# Patient Record
Sex: Female | Born: 1935 | Race: White | Hispanic: No | Smoking: Former smoker
Health system: Southern US, Community
[De-identification: ages and names within clinical notes are randomized; demographics above are authoritative.]

## PROBLEM LIST (undated history)

## (undated) DIAGNOSIS — E785 Hyperlipidemia, unspecified: Secondary | ICD-10-CM

## (undated) DIAGNOSIS — F411 Generalized anxiety disorder: Secondary | ICD-10-CM

## (undated) DIAGNOSIS — F3289 Other specified depressive episodes: Secondary | ICD-10-CM

## (undated) DIAGNOSIS — F329 Major depressive disorder, single episode, unspecified: Secondary | ICD-10-CM

## (undated) DIAGNOSIS — J45909 Unspecified asthma, uncomplicated: Secondary | ICD-10-CM

## (undated) DIAGNOSIS — Z86010 Personal history of colonic polyps: Secondary | ICD-10-CM

## (undated) DIAGNOSIS — I10 Essential (primary) hypertension: Secondary | ICD-10-CM

## (undated) DIAGNOSIS — N200 Calculus of kidney: Secondary | ICD-10-CM

## (undated) DIAGNOSIS — M8580 Other specified disorders of bone density and structure, unspecified site: Secondary | ICD-10-CM

## (undated) DIAGNOSIS — F1910 Other psychoactive substance abuse, uncomplicated: Secondary | ICD-10-CM

## (undated) HISTORY — PX: ANKLE SURGERY: SHX546

## (undated) HISTORY — PX: BREAST SURGERY: SHX581

## (undated) HISTORY — DX: Generalized anxiety disorder: F41.1

## (undated) HISTORY — DX: Personal history of colonic polyps: Z86.010

## (undated) HISTORY — DX: Major depressive disorder, single episode, unspecified: F32.9

## (undated) HISTORY — DX: Other specified depressive episodes: F32.89

## (undated) HISTORY — PX: TONSILLECTOMY: SHX5217

## (undated) HISTORY — DX: Unspecified asthma, uncomplicated: J45.909

## (undated) HISTORY — DX: Other specified disorders of bone density and structure, unspecified site: M85.80

## (undated) HISTORY — DX: Other psychoactive substance abuse, uncomplicated: F19.10

## (undated) HISTORY — DX: Calculus of kidney: N20.0

## (undated) HISTORY — DX: Essential (primary) hypertension: I10

## (undated) HISTORY — DX: Hyperlipidemia, unspecified: E78.5

## (undated) HISTORY — PX: DILATION AND CURETTAGE OF UTERUS: SHX78

---

## 1999-10-28 ENCOUNTER — Other Ambulatory Visit: Admission: RE | Admit: 1999-10-28 | Discharge: 1999-10-28 | Payer: Self-pay | Admitting: Obstetrics & Gynecology

## 2000-04-12 ENCOUNTER — Encounter: Payer: Self-pay | Admitting: Obstetrics & Gynecology

## 2000-04-12 ENCOUNTER — Ambulatory Visit (HOSPITAL_COMMUNITY): Admission: RE | Admit: 2000-04-12 | Discharge: 2000-04-12 | Payer: Self-pay | Admitting: Obstetrics & Gynecology

## 2000-06-21 ENCOUNTER — Encounter: Payer: Self-pay | Admitting: Obstetrics & Gynecology

## 2000-06-23 ENCOUNTER — Encounter (INDEPENDENT_AMBULATORY_CARE_PROVIDER_SITE_OTHER): Payer: Self-pay

## 2000-06-23 ENCOUNTER — Ambulatory Visit (HOSPITAL_COMMUNITY): Admission: RE | Admit: 2000-06-23 | Discharge: 2000-06-23 | Payer: Self-pay | Admitting: Obstetrics & Gynecology

## 2000-10-30 ENCOUNTER — Other Ambulatory Visit: Admission: RE | Admit: 2000-10-30 | Discharge: 2000-10-30 | Payer: Self-pay | Admitting: Obstetrics & Gynecology

## 2001-08-16 ENCOUNTER — Other Ambulatory Visit: Admission: RE | Admit: 2001-08-16 | Discharge: 2001-08-16 | Payer: Self-pay | Admitting: Obstetrics & Gynecology

## 2002-06-11 DIAGNOSIS — Z860100 Personal history of colon polyps, unspecified: Secondary | ICD-10-CM

## 2002-06-11 HISTORY — DX: Personal history of colon polyps, unspecified: Z86.0100

## 2002-06-11 HISTORY — DX: Personal history of colonic polyps: Z86.010

## 2002-07-05 ENCOUNTER — Encounter (INDEPENDENT_AMBULATORY_CARE_PROVIDER_SITE_OTHER): Payer: Self-pay | Admitting: Gastroenterology

## 2002-08-26 ENCOUNTER — Other Ambulatory Visit: Admission: RE | Admit: 2002-08-26 | Discharge: 2002-08-26 | Payer: Self-pay | Admitting: Obstetrics & Gynecology

## 2003-04-02 ENCOUNTER — Encounter (HOSPITAL_BASED_OUTPATIENT_CLINIC_OR_DEPARTMENT_OTHER): Payer: Self-pay | Admitting: Internal Medicine

## 2003-04-02 ENCOUNTER — Encounter: Admission: RE | Admit: 2003-04-02 | Discharge: 2003-04-02 | Payer: Self-pay | Admitting: Internal Medicine

## 2005-01-12 ENCOUNTER — Other Ambulatory Visit: Admission: RE | Admit: 2005-01-12 | Discharge: 2005-01-12 | Payer: Self-pay | Admitting: Obstetrics & Gynecology

## 2005-07-19 ENCOUNTER — Ambulatory Visit: Payer: Self-pay | Admitting: Gastroenterology

## 2005-08-02 ENCOUNTER — Ambulatory Visit: Payer: Self-pay | Admitting: Gastroenterology

## 2007-01-23 ENCOUNTER — Encounter: Payer: Self-pay | Admitting: Internal Medicine

## 2009-03-24 ENCOUNTER — Ambulatory Visit: Payer: Self-pay | Admitting: Gastroenterology

## 2009-03-24 DIAGNOSIS — Z860100 Personal history of colon polyps, unspecified: Secondary | ICD-10-CM | POA: Insufficient documentation

## 2009-03-24 LAB — CONVERTED CEMR LAB
ALT: 25 units/L (ref 0–35)
AST: 31 units/L (ref 0–37)
Albumin: 4.2 g/dL (ref 3.5–5.2)
Alkaline Phosphatase: 55 units/L (ref 39–117)
BUN: 26 mg/dL — ABNORMAL HIGH (ref 6–23)
Basophils Absolute: 0.2 10*3/uL — ABNORMAL HIGH (ref 0.0–0.1)
Basophils Relative: 2.4 % (ref 0.0–3.0)
CO2: 30 meq/L (ref 19–32)
Calcium: 10.1 mg/dL (ref 8.4–10.5)
Chloride: 104 meq/L (ref 96–112)
Creatinine, Ser: 1.1 mg/dL (ref 0.4–1.2)
Eosinophils Absolute: 0.1 10*3/uL (ref 0.0–0.7)
Eosinophils Relative: 1.9 % (ref 0.0–5.0)
GFR calc non Af Amer: 50.97 mL/min (ref >60)
Glucose, Bld: 97 mg/dL (ref 70–99)
HCT: 40.4 % (ref 36.0–46.0)
Hemoglobin: 14 g/dL (ref 12.0–15.0)
Lymphocytes Relative: 25.9 % (ref 12.0–46.0)
Lymphs Abs: 1.8 10*3/uL (ref 0.7–4.0)
MCHC: 34.6 g/dL (ref 30.0–36.0)
MCV: 93.1 fL (ref 78.0–100.0)
Monocytes Absolute: 0.6 10*3/uL (ref 0.1–1.0)
Monocytes Relative: 9.1 % (ref 3.0–12.0)
Neutro Abs: 4.3 10*3/uL (ref 1.4–7.7)
Neutrophils Relative %: 60.7 % (ref 43.0–77.0)
Platelets: 242 10*3/uL (ref 150.0–400.0)
Potassium: 4.4 meq/L (ref 3.5–5.1)
RBC: 4.34 M/uL (ref 3.87–5.11)
RDW: 12.9 % (ref 11.5–14.6)
Sodium: 141 meq/L (ref 135–145)
TSH: 2.18 microintl units/mL (ref 0.35–5.50)
Total Bilirubin: 1.1 mg/dL (ref 0.3–1.2)
Total Protein: 7.3 g/dL (ref 6.0–8.3)
WBC: 7 10*3/uL (ref 4.5–10.5)

## 2009-03-25 ENCOUNTER — Ambulatory Visit: Payer: Self-pay | Admitting: Gastroenterology

## 2009-03-25 ENCOUNTER — Encounter: Payer: Self-pay | Admitting: Gastroenterology

## 2009-03-26 ENCOUNTER — Encounter: Payer: Self-pay | Admitting: Gastroenterology

## 2009-03-26 LAB — CONVERTED CEMR LAB: Tissue Transglutaminase Ab, IgA: 0 units (ref <7)

## 2009-03-27 ENCOUNTER — Telehealth: Payer: Self-pay | Admitting: Gastroenterology

## 2009-04-08 ENCOUNTER — Ambulatory Visit: Payer: Self-pay | Admitting: Gastroenterology

## 2009-04-08 DIAGNOSIS — M359 Systemic involvement of connective tissue, unspecified: Secondary | ICD-10-CM | POA: Insufficient documentation

## 2009-04-27 ENCOUNTER — Telehealth: Payer: Self-pay | Admitting: Gastroenterology

## 2009-06-08 ENCOUNTER — Ambulatory Visit: Payer: Self-pay | Admitting: Gastroenterology

## 2009-06-30 ENCOUNTER — Ambulatory Visit: Payer: Self-pay | Admitting: Internal Medicine

## 2009-06-30 DIAGNOSIS — J309 Allergic rhinitis, unspecified: Secondary | ICD-10-CM | POA: Insufficient documentation

## 2009-06-30 DIAGNOSIS — R5381 Other malaise: Secondary | ICD-10-CM

## 2009-06-30 DIAGNOSIS — J45909 Unspecified asthma, uncomplicated: Secondary | ICD-10-CM | POA: Insufficient documentation

## 2009-06-30 DIAGNOSIS — I10 Essential (primary) hypertension: Secondary | ICD-10-CM

## 2009-06-30 DIAGNOSIS — Z87448 Personal history of other diseases of urinary system: Secondary | ICD-10-CM | POA: Insufficient documentation

## 2009-06-30 DIAGNOSIS — F32A Depression, unspecified: Secondary | ICD-10-CM | POA: Insufficient documentation

## 2009-06-30 DIAGNOSIS — R5383 Other fatigue: Secondary | ICD-10-CM

## 2009-06-30 DIAGNOSIS — E785 Hyperlipidemia, unspecified: Secondary | ICD-10-CM

## 2009-06-30 DIAGNOSIS — F329 Major depressive disorder, single episode, unspecified: Secondary | ICD-10-CM

## 2009-06-30 LAB — CONVERTED CEMR LAB: Pap Smear: NORMAL

## 2009-07-01 ENCOUNTER — Encounter (INDEPENDENT_AMBULATORY_CARE_PROVIDER_SITE_OTHER): Payer: Self-pay | Admitting: *Deleted

## 2009-07-01 LAB — CONVERTED CEMR LAB
AST: 28 units/L (ref 0–37)
Albumin: 4.2 g/dL (ref 3.5–5.2)
Alkaline Phosphatase: 51 units/L (ref 39–117)
BUN: 19 mg/dL (ref 6–23)
Basophils Absolute: 0 10*3/uL (ref 0.0–0.1)
Calcium: 9.9 mg/dL (ref 8.4–10.5)
Creatinine, Ser: 1 mg/dL (ref 0.4–1.2)
Eosinophils Relative: 2.5 % (ref 0.0–5.0)
GFR calc non Af Amer: 56.86 mL/min (ref >60)
Glucose, Bld: 95 mg/dL (ref 70–99)
Lymphocytes Relative: 25.1 % (ref 12.0–46.0)
Monocytes Relative: 10.6 % (ref 3.0–12.0)
Neutrophils Relative %: 61.1 % (ref 43.0–77.0)
Platelets: 228 10*3/uL (ref 150.0–400.0)
Potassium: 4.1 meq/L (ref 3.5–5.1)
RDW: 13.1 % (ref 11.5–14.6)
TSH: 1.63 microintl units/mL (ref 0.35–5.50)
Total Bilirubin: 1.2 mg/dL (ref 0.3–1.2)
WBC: 6.8 10*3/uL (ref 4.5–10.5)

## 2009-08-11 ENCOUNTER — Ambulatory Visit: Payer: Self-pay | Admitting: Internal Medicine

## 2009-08-18 ENCOUNTER — Emergency Department (HOSPITAL_COMMUNITY): Admission: EM | Admit: 2009-08-18 | Discharge: 2009-08-18 | Payer: Self-pay | Admitting: Emergency Medicine

## 2009-08-18 ENCOUNTER — Encounter: Payer: Self-pay | Admitting: Gastroenterology

## 2009-08-18 DIAGNOSIS — N200 Calculus of kidney: Secondary | ICD-10-CM

## 2009-08-18 LAB — CONVERTED CEMR LAB
ALT: 28 units/L
AST: 34 units/L
Alkaline Phosphatase: 54 units/L
BUN: 18 mg/dL
Basophils Relative: 0 %
Bilirubin Urine: NEGATIVE
Calcium: 10.9 mg/dL
Glucose, Bld: 138 mg/dL
Glucose, Urine, Semiquant: NEGATIVE
HCT: 40.2 %
Hemoglobin: 13.9 g/dL
Ketones, urine, test strip: NEGATIVE
Lymphocytes, automated: 11 %
MCV: 93.5 fL
Monocytes Relative: 7 %
Platelets: 229 10*3/uL
Protein, U semiquant: NEGATIVE
Total Bilirubin: 1.1 mg/dL
Total Protein: 7 g/dL
Urobilinogen, UA: 0.2
WBC: 10.5 10*3/uL

## 2009-08-20 ENCOUNTER — Ambulatory Visit: Payer: Self-pay | Admitting: Internal Medicine

## 2009-08-20 LAB — CONVERTED CEMR LAB
Glucose, Urine, Semiquant: NEGATIVE
Ketones, urine, test strip: NEGATIVE
Nitrite: NEGATIVE
Urobilinogen, UA: 0.2
pH: 5

## 2009-08-24 ENCOUNTER — Encounter: Payer: Self-pay | Admitting: Internal Medicine

## 2009-08-25 ENCOUNTER — Telehealth: Payer: Self-pay | Admitting: Gastroenterology

## 2009-09-07 ENCOUNTER — Encounter: Payer: Self-pay | Admitting: Internal Medicine

## 2009-09-09 ENCOUNTER — Ambulatory Visit: Payer: Self-pay | Admitting: Internal Medicine

## 2009-09-24 ENCOUNTER — Ambulatory Visit: Payer: Self-pay | Admitting: Internal Medicine

## 2009-10-06 ENCOUNTER — Encounter: Payer: Self-pay | Admitting: Internal Medicine

## 2009-10-06 ENCOUNTER — Ambulatory Visit: Payer: Self-pay | Admitting: Psychiatry

## 2009-10-14 ENCOUNTER — Ambulatory Visit: Payer: Self-pay | Admitting: Internal Medicine

## 2009-10-14 DIAGNOSIS — F411 Generalized anxiety disorder: Secondary | ICD-10-CM

## 2009-10-15 ENCOUNTER — Ambulatory Visit: Payer: Self-pay | Admitting: Psychiatry

## 2009-12-07 ENCOUNTER — Telehealth: Payer: Self-pay | Admitting: Internal Medicine

## 2009-12-10 ENCOUNTER — Encounter: Payer: Self-pay | Admitting: Internal Medicine

## 2009-12-24 ENCOUNTER — Ambulatory Visit: Payer: Self-pay | Admitting: Internal Medicine

## 2010-01-07 ENCOUNTER — Encounter: Payer: Self-pay | Admitting: Internal Medicine

## 2010-02-10 ENCOUNTER — Ambulatory Visit: Payer: Self-pay | Admitting: Internal Medicine

## 2010-02-10 DIAGNOSIS — N959 Unspecified menopausal and perimenopausal disorder: Secondary | ICD-10-CM | POA: Insufficient documentation

## 2010-02-10 DIAGNOSIS — M25579 Pain in unspecified ankle and joints of unspecified foot: Secondary | ICD-10-CM

## 2010-02-10 LAB — CONVERTED CEMR LAB
Cholesterol: 175 mg/dL (ref 0–200)
LDL Cholesterol: 74 mg/dL (ref 0–99)
TSH: 1.62 microintl units/mL (ref 0.35–5.50)
Total CHOL/HDL Ratio: 2
VLDL: 18.8 mg/dL (ref 0.0–40.0)
Vitamin B-12: 905 pg/mL (ref 211–911)

## 2010-02-12 ENCOUNTER — Encounter: Payer: Self-pay | Admitting: Internal Medicine

## 2010-02-12 ENCOUNTER — Telehealth (INDEPENDENT_AMBULATORY_CARE_PROVIDER_SITE_OTHER): Payer: Self-pay | Admitting: *Deleted

## 2010-02-12 ENCOUNTER — Ambulatory Visit: Payer: Self-pay | Admitting: Internal Medicine

## 2010-03-10 ENCOUNTER — Ambulatory Visit: Payer: Self-pay | Admitting: Internal Medicine

## 2010-03-18 ENCOUNTER — Telehealth: Payer: Self-pay | Admitting: Internal Medicine

## 2010-04-19 ENCOUNTER — Ambulatory Visit: Payer: Self-pay | Admitting: Internal Medicine

## 2010-05-26 ENCOUNTER — Ambulatory Visit: Payer: Self-pay | Admitting: Internal Medicine

## 2010-06-25 ENCOUNTER — Ambulatory Visit: Payer: Self-pay | Admitting: Internal Medicine

## 2010-09-09 ENCOUNTER — Ambulatory Visit: Payer: Self-pay | Admitting: Internal Medicine

## 2010-09-29 ENCOUNTER — Ambulatory Visit: Payer: Self-pay | Admitting: Internal Medicine

## 2011-01-11 NOTE — Progress Notes (Signed)
Summary: Effexor?/VAL?  Phone Note Call from Patient Call back at Home Phone [phone]   Caller: Patient Action Taken: Provider Notified Summary of Call: pt called to inform MD that she stopped Effexor 3 days before Cataract surgery 03/17/2010. Pt re-started Effexor the day of the surgery and noticied that she was "hyper". Pt is concerned about this and is unsure if this is a reaction to the Effexor or the eye drops she is taking (Prednisone 4 x a day). please advise Initial call taken by: Margaret Pyle, CMA,  March 18, 2010 10:18 AM  Follow-up for Phone Call        woukd expect the symptoms to be related to pred rather than effexor. rec pt take effexor as rx'd as this is low dose. Follow-up by: Newt Lukes MD,  March 18, 2010 11:04 AM  Additional Follow-up for Phone Call Additional follow up Details #1::        pt informed Additional Follow-up by: Margaret Pyle, CMA,  March 18, 2010 11:23 AM

## 2011-01-11 NOTE — Miscellaneous (Signed)
Summary: BONE DENSITY  Clinical Lists Changes  Orders: Added new Test order of T-Bone Densitometry (77080) - Signed Added new Test order of T-Lumbar Vertebral Assessment (77082) - Signed 

## 2011-01-11 NOTE — Assessment & Plan Note (Signed)
Summary: 4-6 wks /# / cd   Vital Signs:  Patient profile:   77 year old female Height:      65 inches (165.10 cm) Weight:      134.0 pounds (60.91 kg) O2 Sat:      99 % on Room air Temp:     98.2 degrees F (36.78 degrees C) oral Pulse rate:   74 / minute BP sitting:   130 / 72  (left arm) Cuff size:   regular  Vitals Entered By: Orlan Leavens (Apr 19, 2010 9:04 AM)  O2 Flow:  Room air CC: 4 week follow-up Is Patient Diabetic? No Pain Assessment Patient in pain? no        Primary Care Provider:  Newt Lukes MD  CC:  4 week follow-up.  History of Present Illness: 4 week f/u   1) depression - started on effexor (generic) low dose 3/30 - tol without adv effects note intol of zoloft prev has not been to counseling in > 4 months - still freq tearful spells - feels "so sad" but no SI  2) HTN - stopped amlodipine indep because feels blood pressure has been better controlled -  home log reviewed - SBP 110s-140s, DBP 60-70s no signs or symptoms of problem such as HA, CP or SOB -   3) continued left ankle swelling and "bruise" discoloration - ?if baby asa is making this worse - would like to stop asa for few weeks to see if makes any change   Clinical Review Panels:  CBC   WBC:  10.5 (08/18/2009)   RBC:  4.30 (08/18/2009)   Hgb:  13.9 (08/18/2009)   Hct:  40.2 (08/18/2009)   Platelets:  229 (08/18/2009)   MCV  93.5 (08/18/2009)   MCHC  34.3 (06/30/2009)   RDW  13.6 (08/18/2009)   PMN:  81 (08/18/2009)   Lymphs:  25.1 (06/30/2009)   Monos:  7 (08/18/2009)   Eosinophils:  1 (08/18/2009)   Basophil:  0 (08/18/2009)  Complete Metabolic Panel   Glucose:  138 (08/18/2009)   Sodium:  143 (08/18/2009)   Potassium:  3.6 (08/18/2009)   Chloride:  104 (08/18/2009)   CO2:  31 (08/18/2009)   BUN:  18 (08/18/2009)   Creatinine:  1.25 (08/18/2009)   Albumin:  4.1 (08/18/2009)   Total Protein:  7.0 (08/18/2009)   Calcium:  10.9 (08/18/2009)   Total Bili:  1.1  (08/18/2009)   Alk Phos:  54 (08/18/2009)   SGPT (ALT):  28 (08/18/2009)   SGOT (AST):  34 (08/18/2009)   Current Medications (verified): 1)  Lisinopril 10 Mg Tabs (Lisinopril) .Marland Kitchen.. 1 Tablet By Mouth Once Daily 2)  Triamterene-Hctz 37.5-25 Mg Tabs (Triamterene-Hctz) .Marland Kitchen.. 1 By Mouth Once Daily 3)  Multivitamins   Tabs (Multiple Vitamin) .Marland Kitchen.. 1 Tablet By Mouth Once Daily 4)  Aspirin 81 Mg Tbec (Aspirin) .Marland Kitchen.. 1 Tablet By Mouth Once Daily 5)  Citracal/vitamin D 250-200 Mg-Unit Tabs (Calcium Citrate-Vitamin D) .... 2 By Mouth Two Times A Day 6)  Lipitor 10 Mg Tabs (Atorvastatin Calcium) .Marland Kitchen.. 1 Tablet By Mouth Once Daily 7)  Align  Caps (Misc Intestinal Flora Regulat) .... Take 1 Tablet By Mouth Once A Day 8)  Venlafaxine Hcl 37.5 Mg Xr24h-Tab (Venlafaxine Hcl) .Marland Kitchen.. 1 By Mouth Once Daily  Allergies (verified): 1)  ! * Lialda 2)  ! * Diphen/atrophine  Past History:  Past Medical History: Diverticulosis Hemorrhoids Kidney Stones Adenomatous Colon Polyps 06/2002 Anxiety Disorder Collagenous colitis 2010 Allergic  rhinitis Asthma Colonic polyps, hx of Depression Hyperlipidemia Hypertension  MD rooster: gyn - wein GI - mann  Review of Systems       The patient complains of depression.  The patient denies anorexia, headaches, and difficulty walking.    Physical Exam  General:  alert, well-developed, well-nourished, and cooperative to examination.    Lungs:  normal respiratory effort, no intercostal retractions or use of accessory muscles; normal breath sounds bilaterally - no crackles and no wheezes.    Heart:  normal rate, regular rhythm, no murmur, and no rub. BLE without edema but chronic left ankle and foot enlargement as compared to right - good cap refill B feet Msk:   L ankle with chronic bony enlargment as compared to right side - FROM without pain passive or active - nontender to palp - neurovasc intact - no other joint effusions or deformities in LE - hands with OA  changes in left thumb at MCP and right 5th digit with ulnar deviation   Impression & Recommendations:  Problem # 1:  DEPRESSION (ICD-311)  Her updated medication list for this problem includes:    Venlafaxine Hcl 75 Mg Xr24h-tab (Venlafaxine hcl) .Marland Kitchen... 1 by mouth once daily  recents labs reviewed - normal TSH, B12 - long hx recurrent symptoms - intol of zolft d/t ?diarrhea SE - will continue titrating generic effexor -  d/w pt re: risk/ benefit tx with this med - f/u symptoms and med 4-6 weeks, sooner if probs if still not improved taper off and try new med  Orders: Prescription Created Electronically ([id number])  Problem # 2:  PAIN IN JOINT, ANKLE AND FOOT (ICD-719.47) prior xray did not show DJD or injury early 02/2010 - ok to stop asa for few weeks to see if this makes any change - consider vasc eval though good cap refill present  Problem # 3:  HYPERTENSION (ICD-401.9)  Her updated medication list for this problem includes:    Lisinopril 10 Mg Tabs (Lisinopril) .Marland Kitchen... 1 tablet by mouth once daily    Triamterene-hctz 37.5-25 Mg Tabs (Triamterene-hctz) .Marland Kitchen... 1 by mouth once daily  BP today: 130/72 Prior BP: 124/64 (03/10/2010)  Labs Reviewed: K+: 3.6 (08/18/2009) Creat: : 1.25 (08/18/2009)   Chol: 175 (02/10/2010)   HDL: 82.10 (02/10/2010)   LDL: 74 (02/10/2010)   TG: 94.0 (02/10/2010)  Problem # 4:  HYPERLIPIDEMIA (ICD-272.4)  Her updated medication list for this problem includes:    Lipitor 10 Mg Tabs (Atorvastatin calcium) .Marland Kitchen... 1 tablet by mouth once daily  Labs Reviewed: SGOT: 34 (08/18/2009)   SGPT: 28 (08/18/2009)   HDL:82.10 (02/10/2010)  LDL:74 (02/10/2010)  Chol:175 (02/10/2010), 163 (06/30/2009)  Trig:94.0 (02/10/2010)  Complete Medication List: 1)  Lisinopril 10 Mg Tabs (Lisinopril) .Marland Kitchen.. 1 tablet by mouth once daily 2)  Triamterene-hctz 37.5-25 Mg Tabs (Triamterene-hctz) .Marland Kitchen.. 1 by mouth once daily 3)  Multivitamins Tabs (Multiple vitamin) .Marland Kitchen.. 1 tablet by  mouth once daily 4)  Aspirin 81 Mg Tbec (Aspirin) .Marland Kitchen.. 1 tablet by mouth once daily - hold until further notice 5)  Citracal/vitamin D 250-200 Mg-unit Tabs (Calcium citrate-vitamin d) .... 2 by mouth two times a day 6)  Lipitor 10 Mg Tabs (Atorvastatin calcium) .Marland Kitchen.. 1 tablet by mouth once daily 7)  Align Caps (Misc intestinal flora regulat) .... Take 1 tablet by mouth once a day 8)  Venlafaxine Hcl 75 Mg Xr24h-tab (Venlafaxine hcl) .Marland Kitchen.. 1 by mouth once daily  Patient Instructions: 1)  it was good to see you today.  2)  increase the depression medication to 75mg  once daily - your new prescription has been electronically submitted to your pharmacy. Please take as directed. Contact our office if you believe you're having problems with the medication(s).  3)  hold the baby aspirin for now  4)  call about followup counseling appointment as discussed - 5)  Please schedule a follow-up appointment in 4-6 weeks, sooner if problems.  Prescriptions: VENLAFAXINE HCL 75 MG XR24H-TAB (VENLAFAXINE HCL) 1 by mouth once daily  #30 x 3   Entered and Authorized by:   Newt Lukes MD   Signed by:   Newt Lukes MD on 04/19/2010   Method used:   [facility]ically to        [facility]  [phone]* (retail)       [address] [place], [place]  [address]       Ph: [phone] or [phone]       Fax: [phone]   RxID:   [id number]

## 2011-01-11 NOTE — Letter (Signed)
Summary: [facility]  [facility]   Imported By: Lester Bethpage 03/15/2010 10:37:39  _____________________________________________________________________  External Attachment:    Type:   Image     Comment:   External Document

## 2011-01-11 NOTE — Assessment & Plan Note (Signed)
Summary: 3 MTH FU  STC   Vital Signs:  Patient profile:   77 year old female Height:      65 inches (165.10 cm) Weight:      128.12 pounds (58.24 kg) O2 Sat:      97 % on Room air Temp:     98.6 degrees F (37.00 degrees C) oral Pulse rate:   76 / minute BP sitting:   150 / 84  (left arm) Cuff size:   regular  Vitals Entered By: Orlan Leavens (December 24, 2009 10:30 AM)  O2 Flow:  Room air CC: 3 month follow-up Is Patient Diabetic? No Pain Assessment Patient in pain? no        Primary Care Provider:  Newt Lukes MD  CC:  3 month follow-up.  History of Present Illness: depression - did not change to paxil, still on low dose zoloft continued sadness, diffiiculty focusing - has ?relation of med to recent diarrhea  diarrhea - ongoing since 12/26 following with gi - dr. Loreta Ave - for same has tried lomotil and lialda w/o improvment  Current Medications (verified): 1)  Lisinopril 10 Mg Tabs (Lisinopril) .Marland Kitchen.. 1 Tablet By Mouth Once Daily 2)  Triamterene-Hctz 37.5-25 Mg Tabs (Triamterene-Hctz) .Marland Kitchen.. 1 By Mouth Once Daily 3)  Multivitamins   Tabs (Multiple Vitamin) .Marland Kitchen.. 1 Tablet By Mouth Once Daily 4)  Aspirin 81 Mg Tbec (Aspirin) .Marland Kitchen.. 1 Tablet By Mouth Once Daily 5)  Citracal/vitamin D 250-200 Mg-Unit Tabs (Calcium Citrate-Vitamin D) .... 2 By Mouth Two Times A Day 6)  Lipitor 10 Mg Tabs (Atorvastatin Calcium) .Marland Kitchen.. 1 Tablet By Mouth Once Daily 7)  Align  Caps (Misc Intestinal Flora Regulat) .... Take 1 Tablet By Mouth Once A Day 8)  Zoloft 25 Mg Tabs (Sertraline Hcl) .... Take 1 By Mouth Qd  Allergies (verified): 1)  ! * Lialda 2)  ! * Diphen/atrophine  Review of Systems  The patient denies fever, weight loss, and abdominal pain.    Physical Exam  General:  alert, well-developed, well-nourished, and cooperative to examination.    Lungs:  normal respiratory effort, no intercostal retractions or use of accessory muscles; normal breath sounds bilaterally - no  crackles and no wheezes.    Heart:  normal rate, regular rhythm, no murmur, and no rub. BLE without edema.  Abdomen:  soft, non-tender, normal bowel sounds, no distention Psych:  Oriented X3, memory intact for recent and remote, normally interactive, good eye contact, mildly anxious appearing, not depressed appearing, and not agitated.      Impression & Recommendations:  Problem # 1:  DEPRESSION (ICD-311) no medication changes at this time d/t GI issues - no SI or flare of chronic symptoms  The following medications were removed from the medication list:    Paxil 10 Mg Tabs (Paroxetine hcl) .Marland Kitchen... 1 by mouth once daily Her updated medication list for this problem includes:    Zoloft 25 Mg Tabs (Sertraline hcl) .Marland Kitchen... Take 1 by mouth qd  Problem # 2:  COLLAGENOUS COLITIS (ICD-710.9) mgmt as per GI - Time spent with patient 25 minutes, more than 50% of this time was spent counseling patient on diarrhea and its managment  Complete Medication List: 1)  Lisinopril 10 Mg Tabs (Lisinopril) .Marland Kitchen.. 1 tablet by mouth once daily 2)  Triamterene-hctz 37.5-25 Mg Tabs (Triamterene-hctz) .Marland Kitchen.. 1 by mouth once daily 3)  Multivitamins Tabs (Multiple vitamin) .Marland Kitchen.. 1 tablet by mouth once daily 4)  Aspirin 81 Mg Tbec (Aspirin) .Marland KitchenMarland KitchenMarland Kitchen 1  tablet by mouth once daily 5)  Citracal/vitamin D 250-200 Mg-unit Tabs (Calcium citrate-vitamin d) .... 2 by mouth two times a day 6)  Lipitor 10 Mg Tabs (Atorvastatin calcium) .Marland Kitchen.. 1 tablet by mouth once daily 7)  Align Caps (Misc intestinal flora regulat) .... Take 1 tablet by mouth once a day 8)  Zoloft 25 Mg Tabs (Sertraline hcl) .... Take 1 by mouth qd  Patient Instructions: 1)  it was good to see you today.  2)  continue working with dr. Loreta Ave on your diarrhea issues as you are doing and keep on Align - ok to try "gas x" as needed 3)  no changes recommended at this time - continue zoloft as you are doing at low dose 4)  Please schedule a follow-up appointment in 3-4  months, sooner if problems.

## 2011-01-11 NOTE — Assessment & Plan Note (Signed)
Summary: 4-6 WK FU--STC    Vital Signs:  Patient profile:   77 year old female Height:      65 inches (165.10 cm) Weight:      134.0 pounds (60.91 kg) O2 Sat:      99 % on Room air Temp:     97.7 degrees F (36.50 degrees C) oral Pulse rate:   77 / minute BP sitting:   124 / 64  (left arm) Cuff size:   regular  Vitals Entered By: Orlan Leavens (March 10, 2010 1:35 PM)  O2 Flow:  Room air CC: 4 week follow-up Is Patient Diabetic? No Pain Assessment Patient in pain? no        Primary Care Provider:  Newt Lukes MD  CC:  4 week follow-up.  History of Present Illness: here for f/u -  1) HTN - stopped amlodipine indep because feels blood pressure has been better controlled -  home log reviewed - SBP 110s-140s, DBP 60-70s no signs or symptoms of problem such as HA, CP or SOB -   2) depression - feels "recurrence of sad feling" since last OV - - off Zoloft several months -   sleeping ok most nights but wakes up "so sad and sometimes cry for no reason" - c/o apathy and "no energy - i could just sleep all the time any time" +stressors at home "but no different than usual" denies SI  Current Medications (verified): 1)  Lisinopril 10 Mg Tabs (Lisinopril) .Marland Kitchen.. 1 Tablet By Mouth Once Daily 2)  Triamterene-Hctz 37.5-25 Mg Tabs (Triamterene-Hctz) .Marland Kitchen.. 1 By Mouth Once Daily 3)  Multivitamins   Tabs (Multiple Vitamin) .Marland Kitchen.. 1 Tablet By Mouth Once Daily 4)  Aspirin 81 Mg Tbec (Aspirin) .Marland Kitchen.. 1 Tablet By Mouth Once Daily 5)  Citracal/vitamin D 250-200 Mg-Unit Tabs (Calcium Citrate-Vitamin D) .... 2 By Mouth Two Times A Day 6)  Lipitor 10 Mg Tabs (Atorvastatin Calcium) .Marland Kitchen.. 1 Tablet By Mouth Once Daily 7)  Align  Caps (Misc Intestinal Flora Regulat) .... Take 1 Tablet By Mouth Once A Day  Allergies (verified): 1)  ! * Lialda 2)  ! * Diphen/atrophine  Past History:  Past medical, surgical, family and social histories (including risk factors) reviewed, and no changes noted  (except as noted below).  Past Medical History: Diverticulosis Hemorrhoids Kidney Stones Adenomatous Colon Polyps 06/2002 Anxiety Disorder Collagenous colitis 2010 Allergic rhinitis Asthma Colonic polyps, hx of Depression Hyperlipidemia Hypertension  MD rooster: gyn - wein GI - mann  Past Surgical History: Reviewed history from 06/30/2009 and no changes required. Tonsillectomy (1954) D & C Removed pins from ankle bone Breast implants  Family History: Reviewed history from 06/30/2009 and no changes required. No FH of Colon Cancer: Family History of Breast Cancer: P Aunt Family History of Colon Polyps: brother Family History of Heart Disease: father Hx Alzheimers : parent, aunt  Social History: Reviewed history from 06/30/2009 and no changes required. Occupation: retired Patient is a former smoker. -stopped 45 years Alcohol Use - no Daily Caffeine Use  coffee 1 cup Illicit Drug Use - no Patient gets regular exercise. married, lives with spouse  Review of Systems       The patient complains of depression.  The patient denies anorexia, chest pain, syncope, headaches, and abdominal pain.    Physical Exam  General:  alert, well-developed, well-nourished, and cooperative to examination.    Lungs:  normal respiratory effort, no intercostal retractions or use of accessory muscles; normal breath sounds  bilaterally - no crackles and no wheezes.    Heart:  normal rate, regular rhythm, no murmur, and no rub. BLE without edema Psych:  Oriented X3, memory intact for recent and remote, normally interactive, good eye contact, mildly anxious appearing, mild-mod depressed appearing, and not agitated.      Impression & Recommendations:  Problem # 1:  DEPRESSION (ICD-311)  recents labs reviewed - normal TSH, B12 - long hx recurrent symptoms - intol of zolft d/t ?diarrhea SE - will try low dose generic effexor -  d/w pt re: risk/ benefit tx with this med - f/u symptoms and  med 4-6 weeks, sooner if probs Her updated medication list for this problem includes:    Venlafaxine Hcl 37.5 Mg Xr24h-tab (Venlafaxine hcl) .Marland Kitchen... 1 by mouth once daily  Orders: Prescription Created Electronically ([phone])  Problem # 2:  HYPERTENSION (ICD-401.9) Assessment: Improved ok to stay off amlodipine - BP loks fine on current tx The following medications were removed from the medication list:    Amlodipine Besylate 2.5 Mg Tabs (Amlodipine besylate) .Marland Kitchen... 1 by mouth once daily Her updated medication list for this problem includes:    Lisinopril 10 Mg Tabs (Lisinopril) .Marland Kitchen... 1 tablet by mouth once daily    Triamterene-hctz 37.5-25 Mg Tabs (Triamterene-hctz) .Marland Kitchen... 1 by mouth once daily  BP today: 124/64 Prior BP: 152/80 (02/10/2010)  Labs Reviewed: K+: 3.6 (08/18/2009) Creat: : 1.25 (08/18/2009)   Chol: 175 (02/10/2010)   HDL: 82.10 (02/10/2010)   LDL: 74 (02/10/2010)   TG: 94.0 (02/10/2010)  Complete Medication List: 1)  Lisinopril 10 Mg Tabs (Lisinopril) .Marland Kitchen.. 1 tablet by mouth once daily 2)  Triamterene-hctz 37.5-25 Mg Tabs (Triamterene-hctz) .Marland Kitchen.. 1 by mouth once daily 3)  Multivitamins Tabs (Multiple vitamin) .Marland Kitchen.. 1 tablet by mouth once daily 4)  Aspirin 81 Mg Tbec (Aspirin) .Marland Kitchen.. 1 tablet by mouth once daily 5)  Citracal/vitamin D 250-200 Mg-unit Tabs (Calcium citrate-vitamin d) .... 2 by mouth two times a day 6)  Lipitor 10 Mg Tabs (Atorvastatin calcium) .Marland Kitchen.. 1 tablet by mouth once daily 7)  Align Caps (Misc intestinal flora regulat) .... Take 1 tablet by mouth once a day 8)  Venlafaxine Hcl 37.5 Mg Xr24h-tab (Venlafaxine hcl) .Marland Kitchen.. 1 by mouth once daily  Patient Instructions: 1)  it was good to see you today.  2)  ok to stop amlodipine - blood pressure looks good overall - 3)  will start new medication for depression symptoms - generic effexor - your prescription has been electronically submitted to your pharmacy. Please take as directed. Contact our office if you believe  you're having problems with the medication(s).  4)  Please schedule a follow-up appointment in 4-6 weeks, sooner if problems.  Prescriptions: VENLAFAXINE HCL 37.5 MG XR24H-TAB (VENLAFAXINE HCL) 1 by mouth once daily  #30 x 3   Entered and Authorized by:   Newt Lukes MD   Signed by:   Newt Lukes MD on 03/10/2010   Method used:   [facility] to        [facility]  [phone]* (retail)       [address] [place], [place]  [address]       Ph: [phone] or [phone]       Fax: [phone]   RxID:   [id number]

## 2011-01-11 NOTE — Assessment & Plan Note (Signed)
Summary: cpx / medicare / aarp / will come fasting / #/cd   Vital Signs:  Patient profile:   77 year old female Height:      65 inches (165.10 cm) Weight:      131.0 pounds (59.55 kg) BMI:     21.88 O2 Sat:      97 % on Room air Temp:     97.8 degrees F (36.56 degrees C) oral Pulse rate:   70 / minute BP sitting:   152 / 80  (left arm) Cuff size:   regular  Vitals Entered By: Orlan Leavens (February 10, 2010 8:29 AM)  O2 Flow:  Room air CC: CPX, Depression Is Patient Diabetic? No Pain Assessment Patient in pain? no        Primary Care Provider:  Newt Lukes MD  CC:  CPX and Depression.  History of Present Illness: patient is here today for annual physical. Patient feels well today.  Fasting for lab work and has specifc requests for labs to include b12, tsh and vit d - then send to gyn  also has several other concerns - 1) HTN - feels blood pressure has been poorly controlled - no signs or symptoms of problem such as HA, CP or SOB - compliant with meds as rx'd - ?if new med needed  2) c/o L ankle swelling - onset > 6 weeks ago - gradual onset - assoc with mild pain "but nothing to comlain about" denies recent trauma or injury - no twisting or falls - ?remote fx in same foot - does not affecte walking - no other joints swollen and no swelling of calf or leg  3) depression - feels "cured" - off Zoloft and feels well - diarrhea has resolved - no sadness and min anxiety - sleeping ok most nights  Depression History:      The patient comes in today for a maintenance visit due to her depression.  The patient denies a depressed mood most of the day and a diminished interest in her usual daily activities.  Positive alarm features for depression include fatigue (loss of energy).  However, she denies significant weight loss, insomnia, psychomotor agitation, psychomotor retardation, feelings of worthlessness (guilt), impaired concentration (indecisiveness), and recurrent  thoughts of death or suicide.  The patient denies symptoms of a manic disorder including persistently & abnormally elevated mood, abnormally & persistently irritable mood, distractibility, flight of ideas, and excessive buying sprees.        Risk factors for depression include a personal history of depression.  The patient denies that she feels like life is not worth living, denies that she wishes that she were dead, and denies that she has thought about ending her life.        Preventive Screening-Counseling & Management  Alcohol-Tobacco     Alcohol drinks/day: 0     Alcohol Counseling: not indicated; patient does not drink     Smoking Status: quit     Tobacco Counseling: not indicated; no tobacco use  Caffeine-Diet-Exercise     Does Patient Exercise: yes     Exercise Counseling: not indicated; exercise is adequate     Depression Counseling: not indicated; screening negative for depression  Safety-Violence-Falls     Seat Belt Use: yes     Helmet Use: n/a     Firearms in the Home: no firearms in the home     Smoke Detectors: yes     Violence in the Home: no risk noted  Fall Risk Counseling: not indicated; no significant falls noted  Clinical Review Panels:  Prevention   Last Mammogram:  No specific mammographic evidence of malignancy.   (09/11/2009)   Last Pap Smear:  Normal (06/30/2009)   Last Colonoscopy:  Location:  [facility].  (03/25/2009)  Immunizations   Last Tetanus Booster:  Td (06/30/2009)   Last Flu Vaccine:  Fluvax 3+ (09/09/2009)   Last Pneumovax:  Pneumovax (Medicare) (02/10/2010)  Lipid Management   Cholesterol:  163 (06/30/2009)  CBC   WBC:  10.5 (08/18/2009)   RBC:  4.30 (08/18/2009)   Hgb:  13.9 (08/18/2009)   Hct:  40.2 (08/18/2009)   Platelets:  229 (08/18/2009)   MCV  93.5 (08/18/2009)   MCHC  34.3 (06/30/2009)   RDW  13.6 (08/18/2009)   PMN:  81 (08/18/2009)   Lymphs:  25.1 (06/30/2009)   Monos:  7 (08/18/2009)   Eosinophils:   1 (08/18/2009)   Basophil:  0 (08/18/2009)  Complete Metabolic Panel   Glucose:  138 (08/18/2009)   Sodium:  143 (08/18/2009)   Potassium:  3.6 (08/18/2009)   Chloride:  104 (08/18/2009)   CO2:  31 (08/18/2009)   BUN:  18 (08/18/2009)   Creatinine:  1.25 (08/18/2009)   Albumin:  4.1 (08/18/2009)   Total Protein:  7.0 (08/18/2009)   Calcium:  10.9 (08/18/2009)   Total Bili:  1.1 (08/18/2009)   Alk Phos:  54 (08/18/2009)   SGPT (ALT):  28 (08/18/2009)   SGOT (AST):  34 (08/18/2009)   Current Medications (verified): 1)  Lisinopril 10 Mg Tabs (Lisinopril) .Marland Kitchen.. 1 Tablet By Mouth Once Daily 2)  Triamterene-Hctz 37.5-25 Mg Tabs (Triamterene-Hctz) .Marland Kitchen.. 1 By Mouth Once Daily 3)  Multivitamins   Tabs (Multiple Vitamin) .Marland Kitchen.. 1 Tablet By Mouth Once Daily 4)  Aspirin 81 Mg Tbec (Aspirin) .Marland Kitchen.. 1 Tablet By Mouth Once Daily 5)  Citracal/vitamin D 250-200 Mg-Unit Tabs (Calcium Citrate-Vitamin D) .... 2 By Mouth Two Times A Day 6)  Lipitor 10 Mg Tabs (Atorvastatin Calcium) .Marland Kitchen.. 1 Tablet By Mouth Once Daily 7)  Align  Caps (Misc Intestinal Flora Regulat) .... Take 1 Tablet By Mouth Once A Day  Allergies (verified): 1)  ! * Lialda 2)  ! * Diphen/atrophine  Past History:  Past medical, surgical, family and social histories (including risk factors) reviewed, and no changes noted (except as noted below).  Past Medical History: Reviewed history from 06/30/2009 and no changes required. Diverticulosis Hemorrhoids Kidney Stones Adenomatous Colon Polyps 06/2002 Anxiety Disorder HTN Collagenous colitis 2010 Allergic rhinitis Asthma Colonic polyps, hx of Depression Hyperlipidemia Hypertension  Past Surgical History: Reviewed history from 06/30/2009 and no changes required. Tonsillectomy (1954) D & C Removed pins from ankle bone Breast implants  Family History: Reviewed history from 06/30/2009 and no changes required. No FH of Colon Cancer: Family History of Breast Cancer: P  Aunt Family History of Colon Polyps: brother Family History of Heart Disease: father Hx Alzheimers : parent, aunt  Social History: Reviewed history from 06/30/2009 and no changes required. Occupation: retired Patient is a former smoker. -stopped 45 years Alcohol Use - no Daily Caffeine Use  coffee 1 cup Illicit Drug Use - no Patient gets regular exercise. married, lives with spouseSeat Belt Use:  yes  Review of Systems       see HPI above. I have reviewed all other systems and they were negative.   Physical Exam  General:  alert, well-developed, well-nourished, and cooperative to examination.    Eyes:  vision  grossly intact; pupils equal, round and reactive to light.  conjunctiva and lids normal.    Ears:  normal pinnae bilaterally, without erythema, swelling, or tenderness to palpation. TMs clear, without effusion, or cerumen impaction. Hearing grossly normal bilaterally  Mouth:  teeth and gums in good repair; mucous membranes moist, without lesions or ulcers. oropharynx clear without exudate, no erythema.  Lungs:  normal respiratory effort, no intercostal retractions or use of accessory muscles; normal breath sounds bilaterally - no crackles and no wheezes.    Heart:  normal rate, regular rhythm, no murmur, and no rub. BLE without edema in calf. (L foot with isolated swelling including ankle - see Mskel) Abdomen:  soft, non-tender, normal bowel sounds, no distention; no masses and no appreciable hepatomegaly or splenomegaly.   Genitalia:  recently done by gyn (wein) - normal per pt Msk:   L ankle with chronic bony enlargment, ? 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  strength normal  in all extremities, sensation intact to light touch, and gait normal. speech fluent without dysarthria or aphasia; follows commands with good comprehension.  Skin:  no rashes, vesicles, ulcers, or erythema. No nodules or irregularity to palpation.  bluish discoloration of both feet Psych:  Oriented X3, memory intact for recent and remote, normally interactive, good eye contact, mildly anxious appearing, not depressed appearing, and not agitated.      Impression & Recommendations:  Problem # 1:  PHYSICAL EXAMINATION (ICD-V70.0)  Patient has been counseled on age-appropriate routine health concerns for screening and prevention. These are reviewed and up-to-date. Immunizations are up-to-date or declined. Labs ordered as indicated and will be reviewed.  Risk factors for depression per USPSTF are reviewed and negative. Patient's hearing function and visual acuity are screened today; ADLs are reviewed and addressed as needed; functional ability and level of safety have been reviewed and are appropriate. ECG is performed and reviewed. Education, counseling, and referrals are performed based on age appropriate health issues. written checklist of recommended age appropriate screening as per USPSTF provided Orders: First annual wellness visit with prevention plan  [phone]) T-Bone Densitometry [phone]) T-Lumbar Vertebral Assessment [phone])  Problem # 2:  PAIN IN JOINT, ANKLE AND FOOT (ICD-719.47) ?DJD from remote injury - no abn on exam outside swelling, min pain - consider eval by ortho depending on plain xray results - (order now) no evidence for DVT or circulation issues Orders: T-Ankle Comp Left Min 3 Views (73610TC)  Problem # 3:  HYPERTENSION (ICD-401.9) Assessment: Deteriorated add new agent as BP poor control - Her updated medication list for this problem includes:    Lisinopril 10 Mg Tabs (Lisinopril) .Marland Kitchen... 1 tablet by mouth once daily    Triamterene-hctz 37.5-25 Mg Tabs  (Triamterene-hctz) .Marland Kitchen... 1 by mouth once daily    Amlodipine Besylate 2.5 Mg Tabs (Amlodipine besylate) .Marland Kitchen... 1 by mouth once daily  Orders: EKG w/ Interpretation (93000) Prescription Created Electronically [phone])  BP today: 152/80 Prior BP: 150/84 (12/24/2009)  Labs Reviewed: K+: 3.6 (08/18/2009) Creat: : 1.25 (08/18/2009)   Chol: 163 (06/30/2009)     Problem # 4:  HYPERLIPIDEMIA (ICD-272.4)  Her updated medication list for this problem includes:    Lipitor  10 Mg Tabs (Atorvastatin calcium) .Marland Kitchen... 1 tablet by mouth once daily  Orders: TLB-Lipid Panel (80061-LIPID)  Labs Reviewed: SGOT: 34 (08/18/2009)   SGPT: 28 (08/18/2009)   Chol:163 (06/30/2009)  Problem # 5:  DEPRESSION (ICD-311) improved - off meds at this time - cont to observe as pt has long hx of recurrent symptoms  The following medications were removed from the medication list:    Zoloft 25 Mg Tabs (Sertraline hcl) .Marland Kitchen... Take 1 by mouth qd  Orders: TLB-TSH (Thyroid Stimulating Hormone) (84443-TSH) TLB-B12, Serum-Total ONLY (16109-U04)  Problem # 6:  POSTMENOPAUSAL SYNDROME (ICD-627.9)  Orders: T-Lumbar Vertebral Assessment (54098) T-Vitamin D (25-Hydroxy) (11914-78295)  Complete Medication List: 1)  Lisinopril 10 Mg Tabs (Lisinopril) .Marland Kitchen.. 1 tablet by mouth once daily 2)  Triamterene-hctz 37.5-25 Mg Tabs (Triamterene-hctz) .Marland Kitchen.. 1 by mouth once daily 3)  Multivitamins Tabs (Multiple vitamin) .Marland Kitchen.. 1 tablet by mouth once daily 4)  Aspirin 81 Mg Tbec (Aspirin) .Marland Kitchen.. 1 tablet by mouth once daily 5)  Citracal/vitamin D 250-200 Mg-unit Tabs (Calcium citrate-vitamin d) .... 2 by mouth two times a day 6)  Lipitor 10 Mg Tabs (Atorvastatin calcium) .Marland Kitchen.. 1 tablet by mouth once daily 7)  Align Caps (Misc intestinal flora regulat) .... Take 1 tablet by mouth once a day 8)  Amlodipine Besylate 2.5 Mg Tabs (Amlodipine besylate) .Marland Kitchen.. 1 by mouth once daily  Other Orders: Pneumococcal Vaccine (62130) Admin 1st Vaccine  (86578)  Patient Instructions: 1)  it was good to see you today. 2)  test(s) (blood work and Personal assistant) ordered today - your results will be posted on the phone tree for review in 48-72 hours from the time of test completion; call [phone] and enter your 9 digit MRN (listed above on this page, just below your name); if any changes need to be made or there are abnormal results, you will be contacted directly. Will also send lab results to Dr. Aldona Bar 3)  we'll make referral for bone density testing. Our office will contact you regarding this appointment once made.  4)  new medication for high blood pressure - amlodipine - take this in addition to your other pills - your prescription has been electronically submitted to your pharmacy. Please take as directed. Contact our office if you believe you're having problems with the medication(s).  5)  Please schedule a follow-up appointment in 4-6 weeks to recheck blood pressure, sooner if problems.  Prescriptions: AMLODIPINE BESYLATE 2.5 MG TABS (AMLODIPINE BESYLATE) 1 by mouth once daily  #30 x 5   Entered and Authorized by:   Newt Lukes MD   Signed by:   Newt Lukes MD on 02/10/2010   Method used:   [facility] to        [facility]  [address]  [phone]* (retail)       [address] [place], [place]  [address]       Ph: [phone] or [phone]       Fax: [phone]   RxID:   [id number]    Mammogram  Procedure date:  09/11/2009  Findings:      No specific mammographic evidence of malignancy.      Immunizations Administered:  Pneumonia Vaccine:    Vaccine Type: Pneumovax (Medicare)    Site: right deltoid    Mfr: Merck    Dose: 0.25 ml    Route: IM    Given by: Orlan Leavens    Exp. Date: 07/26/2011    Lot #: [id number]  VIS given: 02/10/10    [email or web address]  depression

## 2011-01-11 NOTE — Assessment & Plan Note (Signed)
Summary: 4-6 wk rov /nws   Vital Signs:  Patient profile:   77 year old female Height:      65 inches (165.10 cm) Weight:      134.4 pounds (61.09 kg) O2 Sat:      92 % on Room air Temp:     98.0 degrees F (36.67 degrees C) oral Pulse rate:   73 / minute BP sitting:   130 / 70  (left arm) Cuff size:   regular  Vitals Entered By: Orlan Leavens (May 26, 2010 10:14 AM)  O2 Flow:  Room air CC: 4 week f/u Is Patient Diabetic? No Pain Assessment Patient in pain? no        Primary Care Provider:  Newt Lukes MD  CC:  4 week f/u.  History of Present Illness: 4 week f/u   1) depression - started on effexor (generic) low dose 3/30 - tol without adv effects - titration up 4 weeks ago last OV- stopped medication herself due to headache and insomnia SE - note also intol of zoloft prev has not been to counseling in > 4 months - still freq tearful spells - feels "so sad" but no SI  2) HTN - stopped amlodipine indep because feels blood pressure has been better controlled -  home log reviewed - SBP 110s-140s, DBP 60-70s no signs or symptoms of problem such as HA, CP or SOB -    Current Medications (verified): 1)  Lisinopril 10 Mg Tabs (Lisinopril) .Marland Kitchen.. 1 Tablet By Mouth Once Daily 2)  Triamterene-Hctz 37.5-25 Mg Tabs (Triamterene-Hctz) .Marland Kitchen.. 1 By Mouth Once Daily 3)  Multivitamins   Tabs (Multiple Vitamin) .Marland Kitchen.. 1 Tablet By Mouth Once Daily 4)  Aspirin 81 Mg Tbec (Aspirin) .Marland Kitchen.. 1 Tablet By Mouth Once Daily - Hold Until Further Notice 5)  Citracal/vitamin D 250-200 Mg-Unit Tabs (Calcium Citrate-Vitamin D) .... 2 By Mouth Two Times A Day 6)  Lipitor 10 Mg Tabs (Atorvastatin Calcium) .Marland Kitchen.. 1 Tablet By Mouth Once Daily 7)  Align  Caps (Misc Intestinal Flora Regulat) .... Take 1 Tablet By Mouth Once A Day  Allergies (verified): 1)  ! * Lialda 2)  ! * Diphen/atrophine  Past History:  Past Medical History: Diverticulosis Hemorrhoids Kidney Stones Adenomatous Colon Polyps  06/2002 Anxiety Disorder Collagenous colitis 2010 Allergic rhinitis Asthma Colonic polyps, hx of Depression Hyperlipidemia Hypertension  MD roster: gyn - wein GI - mann  Review of Systems       The patient complains of depression.  The patient denies weight loss, chest pain, and abdominal pain.    Physical Exam  General:  alert, well-developed, well-nourished, and cooperative to examination.    Lungs:  normal respiratory effort, no intercostal retractions or use of accessory muscles; normal breath sounds bilaterally - no crackles and no wheezes.    Heart:  normal rate, regular rhythm, no murmur, and no rub. BLE without edema but chronic left ankle and foot enlargement as compared to right - Psych:  Oriented X3, memory intact for recent and remote, normally interactive, good eye contact, mildly anxious appearing, mod depressed appearing and tearful     Impression & Recommendations:  Problem # 1:  DEPRESSION (ICD-311)  The following medications were removed from the medication list:    Venlafaxine Hcl 75 Mg Xr24h-tab (Venlafaxine hcl) .Marland Kitchen... 1 by mouth once daily Her updated medication list for this problem includes:    Wellbutrin Xl 150 Mg Xr24h-tab (Bupropion hcl) .Marland Kitchen... 1 by mouth once daily  recents  labs reviewed - normal TSH, B12 - long hx recurrent symptoms - intol of zolft d/t ?diarrhea SE - now intol of venlafaxine wil try wellbutrin - new erx done d/w pt re: risk/ benefit tx with this med - willing to try also encouraged f/u with counseling - pt will look into [facility] appt f/u symptoms and med 4-6 weeks, sooner if probs if still not improved taper off and try new med  Orders: Prescription Created Electronically [phone])  Complete Medication List: 1)  Lisinopril 10 Mg Tabs (Lisinopril) .Marland Kitchen.. 1 tablet by mouth once daily 2)  Triamterene-hctz 37.5-25 Mg Tabs (Triamterene-hctz) .Marland Kitchen.. 1 by mouth once daily 3)  Multivitamins Tabs (Multiple vitamin) .Marland Kitchen.. 1 tablet by  mouth once daily 4)  Aspirin 81 Mg Tbec (Aspirin) .Marland Kitchen.. 1 tablet by mouth once daily - hold until further notice 5)  Citracal/vitamin D 250-200 Mg-unit Tabs (Calcium citrate-vitamin d) .... 2 by mouth two times a day 6)  Lipitor 10 Mg Tabs (Atorvastatin calcium) .Marland Kitchen.. 1 tablet by mouth once daily 7)  Align Caps (Misc intestinal flora regulat) .... Take 1 tablet by mouth once a day 8)  Wellbutrin Xl 150 Mg Xr24h-tab (Bupropion hcl) .Marland Kitchen.. 1 by mouth once daily  Patient Instructions: 1)  it was good to see you today. 2)  start new medication - wellbutrin xl -for depression - your new prescription has been electronically submitted to your pharmacy. Please take as directed. Contact our office if you believe you're having problems with the medication(s).  3)  call about followup counseling appointment as discussed - 4)  Please schedule a follow-up appointment in 4-6 weeks, sooner if problems.  Prescriptions: WELLBUTRIN XL 150 MG XR24H-TAB (BUPROPION HCL) 1 by mouth once daily  #30 x 3   Entered and Authorized by:   Newt Lukes MD   Signed by:   Newt Lukes MD on 05/26/2010   Method used:   [facility]        [facility]  [phone][facility] (retail)       [address] [place], [place]  [address]       Ph: [phone] or [phone]       Fax: [phone]   RxID:   [id number]

## 2011-01-11 NOTE — Assessment & Plan Note (Signed)
Summary: 3 mth fu--stc   Vital Signs:  Patient profile:   77 year old female Height:      65 inches (165.10 cm) Weight:      135.4 pounds (61.55 kg) O2 Sat:      91 % on Room air Temp:     97.5 degrees F (36.39 degrees C) oral Pulse rate:   91 / minute BP sitting:   138 / 70  (left arm) Cuff size:   regular  Vitals Entered By: Orlan Leavens RMA (September 29, 2010 10:24 AM)  O2 Flow:  Room air CC: 3 month follow-up Is Patient Diabetic? No Pain Assessment Patient in pain? no        Primary Care Provider:  Newt Lukes MD  CC:  3 month follow-up.  History of Present Illness: here for f/u  1) depression - largely exac by family stressors - spouse and son started on effexor (generic) low dose 3/30 - tol without adv effects - titration up poorly tol- stopped medication herself due to headache and insomnia SE - note also intol of zoloft prev -  tried wellbutrin - also poorly tol after single dose recently est with been to [facility] - still freq tearful spells - feels "so sad" but no SI, very tired related to med? tried citalopram from [facility] - lisa polis - but dismissed from f/u "b/c i wasn't taking the meds" changed to generic prozac - feels energized but more irritable in afternoon with spouse  2) HTN - stopped amlodipine indep because feels blood pressure has been better controlled -  on ACEI + diuretic - home log reviewed - SBP 110s-140s, DBP 60-70s -no signs or symptoms of problem such as HA, CP or SOB -   3) dyslipidemia - reports compliance with ongoing medical treatment (statin) and no changes in medication dose or frequency. denies adverse side effects related to current therapy.    Clinical Review Panels:  Immunizations   Last Tetanus Booster:  Td (06/30/2009)   Last Flu Vaccine:  Fluvax 3+ (09/09/2010)   Last Pneumovax:  Pneumovax (Medicare) (02/10/2010)  Lipid Management   Cholesterol:  175 (02/10/2010)   LDL (bad  choesterol):  74 (02/10/2010)   HDL (good cholesterol):  82.10 (02/10/2010)  CBC   WBC:  10.5 (08/18/2009)   RBC:  4.30 (08/18/2009)   Hgb:  13.9 (08/18/2009)   Hct:  40.2 (08/18/2009)   Platelets:  229 (08/18/2009)   MCV  93.5 (08/18/2009)   MCHC  34.3 (06/30/2009)   RDW  13.6 (08/18/2009)   PMN:  81 (08/18/2009)   Lymphs:  25.1 (06/30/2009)   Monos:  7 (08/18/2009)   Eosinophils:  1 (08/18/2009)   Basophil:  0 (08/18/2009)  Complete Metabolic Panel   Glucose:  138 (08/18/2009)   Sodium:  143 (08/18/2009)   Potassium:  3.6 (08/18/2009)   Chloride:  104 (08/18/2009)   CO2:  31 (08/18/2009)   BUN:  18 (08/18/2009)   Creatinine:  1.25 (08/18/2009)   Albumin:  4.1 (08/18/2009)   Total Protein:  7.0 (08/18/2009)   Calcium:  10.9 (08/18/2009)   Total Bili:  1.1 (08/18/2009)   Alk Phos:  54 (08/18/2009)   SGPT (ALT):  28 (08/18/2009)   SGOT (AST):  34 (08/18/2009)   Current Medications (verified): 1)  Lisinopril 10 Mg Tabs (Lisinopril) .Marland Kitchen.. 1 Tablet By Mouth Once Daily 2)  Triamterene-Hctz 37.5-25 Mg Tabs (Triamterene-Hctz) .Marland Kitchen.. 1 By Mouth Once Daily 3)  Multivitamins  Tabs (Multiple Vitamin) .Marland Kitchen.. 1 Tablet By Mouth Once Daily 4)  Aspirin 81 Mg Tbec (Aspirin) .Marland Kitchen.. 1 Tablet By Mouth Once Daily - Hold Until Further Notice 5)  Citracal/vitamin D 250-200 Mg-Unit Tabs (Calcium Citrate-Vitamin D) .... 2 By Mouth Two Times A Day 6)  Lipitor 10 Mg Tabs (Atorvastatin Calcium) .Marland Kitchen.. 1 Tablet By Mouth Once Daily 7)  Fluoxetine Hcl 10 Mg Caps (Fluoxetine Hcl) .... Take 1 By Mouth Once Daily 8)  Phillips Colon Health  Caps (Probiotic Product) .... Take 1 By Mouth Once Daily  Allergies (verified): 1)  ! * Lialda 2)  ! * Diphen/atrophine  Past History:  Past Medical History: Diverticulosis Hemorrhoids Kidney Stones Adenomatous Colon Polyps 06/2002 Anxiety Disorder Collagenous colitis 2010 Allergic rhinitis Asthma Colonic polyps, hx of   Depression Hyperlipidemia Hypertension  MD roster: gyn - [facility] - [facility] - none now- prev, lisa poulos ([facility])  Social History: Occupation: retired Patient is a former smoker, stopped 45 years ago Alcohol Use - no Daily Caffeine Use  coffee 1 cup  Illicit Drug Use - no Patient gets regular exercise. married, lives with spouse  Review of Systems  The patient denies fever, chest pain, syncope, and peripheral edema.    Physical Exam  General:  alert, well-developed, well-nourished, and cooperative to examination.    Lungs:  normal respiratory effort, no intercostal retractions or use of accessory muscles; normal breath sounds bilaterally - no crackles and no wheezes.    Heart:  normal rate, regular rhythm, no murmur, and no rub. BLE without edema but chronic left ankle and foot enlargement as compared to right - Psych:  Oriented X3, memory intact for recent and remote, normally interactive, good eye contact, mildly anxious appearing, mild depressed appearing and tearful     Impression & Recommendations:  Problem # 1:  DEPRESSION (ICD-311)  The following medications were removed from the medication list:    Citalopram Hydrobromide 10 Mg Tabs (Citalopram hydrobromide) .Marland Kitchen... Take 1 at bedtime Her updated medication list for this problem includes:    Fluoxetine Hcl 10 Mg Caps (Fluoxetine hcl) .Marland Kitchen... Take 1 by mouth once daily  no longer going to [facility]c ([facility]) for counseling and med mgmt - encouraged to resume same - also encouraged to remain on tx x 4 weeks before determining if med tx effectove or not -  could consider low dose alpraz to help with pm irritability if feels improved energy on prozac - pt will consider Time spent with patient 25 minutes, more than 50% of this time was spent counseling patient on depression  Problem # 2:  HYPERTENSION (ICD-401.9)  Her updated medication list for this problem includes:    Lisinopril 10 Mg Tabs (Lisinopril) .Marland Kitchen... 1  tablet by mouth once daily    Triamterene-hctz 37.5-25 Mg Tabs (Triamterene-hctz) .Marland Kitchen... 1 by mouth once daily  BP today: 138/70 Prior BP: 110/62 (06/25/2010)  Labs Reviewed: K+: 3.6 (08/18/2009) Creat: : 1.25 (08/18/2009)   Chol: 175 (02/10/2010)   HDL: 82.10 (02/10/2010)   LDL: 74 (02/10/2010)   TG: 94.0 (02/10/2010)  Problem # 3:  HYPERLIPIDEMIA (ICD-272.4)  Her updated medication list for this problem includes:    Lipitor 10 Mg Tabs (Atorvastatin calcium) .Marland Kitchen... 1 tablet by mouth once daily  Labs Reviewed: SGOT: 34 (08/18/2009)   SGPT: 28 (08/18/2009)   HDL:82.10 (02/10/2010)  LDL:74 (02/10/2010)  Chol:175 (02/10/2010), 163 (06/30/2009)  Trig:94.0 (02/10/2010)  Complete Medication List: 1)  Lisinopril 10 Mg Tabs (Lisinopril) .Marland Kitchen.. 1 tablet  by mouth once daily 2)  Triamterene-hctz 37.5-25 Mg Tabs (Triamterene-hctz) .Marland Kitchen.. 1 by mouth once daily 3)  Multivitamins Tabs (Multiple vitamin) .Marland Kitchen.. 1 tablet by mouth once daily 4)  Aspirin 81 Mg Tbec (Aspirin) .Marland Kitchen.. 1 tablet by mouth once daily - hold until further notice 5)  Citracal/vitamin D 250-200 Mg-unit Tabs (Calcium citrate-vitamin d) .... 2 by mouth two times a day 6)  Lipitor 10 Mg Tabs (Atorvastatin calcium) .Marland Kitchen.. 1 tablet by mouth once daily 7)  Fluoxetine Hcl 10 Mg Caps (Fluoxetine hcl) .... Take 1 by mouth once daily 8)  Phillips Colon Health Caps (Probiotic product) .... Take 1 by mouth once daily  Patient Instructions: 1)  it was good to see you today. 2)  continue new medication - fluoxetine -for depression for at least 4 weeks every day before making any changes -  3)  let us know if you need a counseling referral as discussed - 4)  Please schedule a follow-up appointment in 4 months to review blood pressure, cholesterol and depression medications, call sooner if problems.    Orders Added: 1)  Est. Patient Level IV [21194]

## 2011-01-11 NOTE — Assessment & Plan Note (Signed)
Summary: FLU VAC  VL  STC  Nurse Visit   Vital Signs:  Patient profile:   77 year old female Temp:     97.9 degrees F  Vitals Entered By: Lanier Prude, CMA(AAMA) (September 09, 2010 1:20 PM)  Allergies: 1)  ! * Lialda 2)  ! * Diphen/atrophine  Orders Added: 1)  Flu Vaccine 17yrs + MEDICARE PATIENTS [Q2039] 2)  Administration Flu vaccine - MCR [G0008] .lbmedflu   Flu Vaccine Consent Questions     Do you have a history of severe allergic reactions to this vaccine? no    Any prior history of allergic reactions to egg and/or gelatin? no    Do you have a sensitivity to the preservative Thimersol? no    Do you have a past history of Guillan-Barre Syndrome? no    Do you currently have an acute febrile illness? no    Have you ever had a severe reaction to latex? no    Vaccine information given and explained to patient? yes    Are you currently pregnant? no    Lot Number:[id number]   Exp Date:06/11/2011   Site Given  Left Deltoid IM Lanier Prude, [facility])  September 09, 2010 1:21 PM

## 2011-01-11 NOTE — Letter (Signed)
Summary: [facility]  [facility]   Imported By: Sherian Rein 01/18/2010 10:58:59  _____________________________________________________________________  External Attachment:    Type:   Image     Comment:   External Document

## 2011-01-11 NOTE — Assessment & Plan Note (Signed)
Summary: 4-6 WK FU--STC   Vital Signs:  Patient profile:   77 year old female Height:      65 inches (165.10 cm) Weight:      137.2 pounds (62.36 kg) O2 Sat:      93 % on Room air Temp:     97.3 degrees F (36.28 degrees C) oral Pulse rate:   73 / minute BP sitting:   110 / 62  (left arm) Cuff size:   regular  Vitals Entered By: Orlan Leavens (June 25, 2010 8:30 AM)  O2 Flow:  Room air CC: 4 week follow-up Is Patient Diabetic? No Pain Assessment Patient in pain? no      Comments Pt states she stop taking wellbrutrin due to side effects.    Primary Care Provider:  Newt Lukes MD  CC:  4 week follow-up.  History of Present Illness: 4 week f/u   1) depression - started on effexor (generic) low dose 3/30 - tol without adv effects - titration up poorly tol- stopped medication herself due to headache and insomnia SE - note also intol of zoloft prev -  tried wellbutrin - also poorly tol after single dose recently est with been to [facility] - still freq tearful spells - feels "so sad" but no SI, very tired related to med? now on citalopram from [facility] - so far so good x 2 days  2) HTN - stopped amlodipine indep because feels blood pressure has been better controlled -  home log reviewed - SBP 110s-140s, DBP 60-70s no signs or symptoms of problem such as HA, CP or SOB -    Current Medications (verified): 1)  Lisinopril 10 Mg Tabs (Lisinopril) .Marland Kitchen.. 1 Tablet By Mouth Once Daily 2)  Triamterene-Hctz 37.5-25 Mg Tabs (Triamterene-Hctz) .Marland Kitchen.. 1 By Mouth Once Daily 3)  Multivitamins   Tabs (Multiple Vitamin) .Marland Kitchen.. 1 Tablet By Mouth Once Daily 4)  Aspirin 81 Mg Tbec (Aspirin) .Marland Kitchen.. 1 Tablet By Mouth Once Daily - Hold Until Further Notice 5)  Citracal/vitamin D 250-200 Mg-Unit Tabs (Calcium Citrate-Vitamin D) .... 2 By Mouth Two Times A Day 6)  Lipitor 10 Mg Tabs (Atorvastatin Calcium) .Marland Kitchen.. 1 Tablet By Mouth Once Daily 7)  Align  Caps (Misc Intestinal  Flora Regulat) .... Take 1 Tablet By Mouth Once A Day 8)  Citalopram Hydrobromide 10 Mg Tabs (Citalopram Hydrobromide) .... Take 1 At Bedtime  Allergies (verified): 1)  ! * Lialda 2)  ! * Diphen/atrophine  Past History:  Past Medical History: Diverticulosis Hemorrhoids Kidney Stones Adenomatous Colon Polyps 06/2002 Anxiety Disorder Collagenous colitis 2010 Allergic rhinitis Asthma Colonic polyps, hx of Depression Hyperlipidemia Hypertension  MD roster: gyn - [facility] - [facility] - lisa poulos ([facility])  Review of Systems  The patient denies weight loss, chest pain, and headaches.    Physical Exam  General:  alert, well-developed, well-nourished, and cooperative to examination.    Lungs:  normal respiratory effort, no intercostal retractions or use of accessory muscles; normal breath sounds bilaterally - no crackles and no wheezes.    Heart:  normal rate, regular rhythm, no murmur, and no rub. BLE without edema but chronic left ankle and foot enlargement as compared to right - Psych:  Oriented X3, memory intact for recent and remote, normally interactive, good eye contact, mildly anxious appearing, mild depressed appearing and tearful     Impression & Recommendations:  Problem # 1:  DEPRESSION (ICD-311) now [facility] ([facility]) for counseling and med mgmt - encouraged to cont same - Time spent with patient 25 minutes, more than 50% of this time was spent counseling patient on depression  The following medications were removed from the medication list:    Wellbutrin Xl 150 Mg Xr24h-tab (Bupropion hcl) .Marland Kitchen... 1 by mouth once daily Her updated medication list for this problem includes:    Citalopram Hydrobromide 10 Mg Tabs (Citalopram hydrobromide) .Marland Kitchen... Take 1 at bedtime  Problem # 2:  HYPERTENSION (ICD-401.9)  Her updated medication list for this problem includes:    Lisinopril 10 Mg Tabs (Lisinopril) .Marland Kitchen... 1 tablet by mouth once daily    Triamterene-hctz  37.5-25 Mg Tabs (Triamterene-hctz) .Marland Kitchen... 1 by mouth once daily  BP today: 110/62 Prior BP: 130/70 (05/26/2010)  Labs Reviewed: K+: 3.6 (08/18/2009) Creat: : 1.25 (08/18/2009)   Chol: 175 (02/10/2010)   HDL: 82.10 (02/10/2010)   LDL: 74 (02/10/2010)   TG: 94.0 (02/10/2010)  Problem # 3:  HYPERLIPIDEMIA (ICD-272.4)  Her updated medication list for this problem includes:    Lipitor 10 Mg Tabs (Atorvastatin calcium) .Marland Kitchen... 1 tablet by mouth once daily  Labs Reviewed: SGOT: 34 (08/18/2009)   SGPT: 28 (08/18/2009)   HDL:82.10 (02/10/2010)  LDL:74 (02/10/2010)  Chol:175 (02/10/2010), 163 (06/30/2009)  Trig:94.0 (02/10/2010)  Complete Medication List: 1)  Lisinopril 10 Mg Tabs (Lisinopril) .Marland Kitchen.. 1 tablet by mouth once daily 2)  Triamterene-hctz 37.5-25 Mg Tabs (Triamterene-hctz) .Marland Kitchen.. 1 by mouth once daily 3)  Multivitamins Tabs (Multiple vitamin) .Marland Kitchen.. 1 tablet by mouth once daily 4)  Aspirin 81 Mg Tbec (Aspirin) .Marland Kitchen.. 1 tablet by mouth once daily - hold until further notice 5)  Citracal/vitamin D 250-200 Mg-unit Tabs (Calcium citrate-vitamin d) .... 2 by mouth two times a day 6)  Lipitor 10 Mg Tabs (Atorvastatin calcium) .Marland Kitchen.. 1 tablet by mouth once daily 7)  Align Caps (Misc intestinal flora regulat) .... Take 1 tablet by mouth once a day 8)  Citalopram Hydrobromide 10 Mg Tabs (Citalopram hydrobromide) .... Take 1 at bedtime  Patient Instructions: 1)  it was good to see you today. 2)  continue new medication - citalopram -for depression -  3)  continue to followup  with counseling and medication appointment at [facility] as discussed - 4)  Please schedule a follow-up appointment in 3 months, sooner if problems.

## 2011-01-11 NOTE — Progress Notes (Signed)
Summary: Records received from Dr. Annamaria Helling  3 pages of records received from Dr. Franklyn Lor office. Records forwarded to Dr. Felicity Coyer for review. Dena Chavis  February 12, 2010 3:43 PM

## 2011-01-14 NOTE — Letter (Signed)
Summary: Diarrhea/[facility]  Diarrhea/[facility]   Imported By: Sherian Rein 12/25/2009 10:12:07  _____________________________________________________________________  External Attachment:    Type:   Image     Comment:   External Document

## 2011-01-24 ENCOUNTER — Encounter: Payer: Self-pay | Admitting: Internal Medicine

## 2011-01-31 ENCOUNTER — Other Ambulatory Visit: Payer: Self-pay | Admitting: Obstetrics & Gynecology

## 2011-02-02 ENCOUNTER — Ambulatory Visit: Payer: Self-pay | Admitting: Internal Medicine

## 2011-02-02 NOTE — Letter (Signed)
Summary: Vic Blackbird MD  Vic Blackbird MD   Imported By: Lester Wynnedale 01/28/2011 10:52:27  _____________________________________________________________________  External Attachment:    Type:   Image     Comment:   External Document

## 2011-02-03 ENCOUNTER — Ambulatory Visit (INDEPENDENT_AMBULATORY_CARE_PROVIDER_SITE_OTHER): Payer: Medicare Other | Admitting: Internal Medicine

## 2011-02-03 ENCOUNTER — Encounter: Payer: Self-pay | Admitting: Internal Medicine

## 2011-02-03 DIAGNOSIS — I10 Essential (primary) hypertension: Secondary | ICD-10-CM

## 2011-02-03 DIAGNOSIS — E785 Hyperlipidemia, unspecified: Secondary | ICD-10-CM

## 2011-02-03 DIAGNOSIS — F329 Major depressive disorder, single episode, unspecified: Secondary | ICD-10-CM

## 2011-02-08 NOTE — Assessment & Plan Note (Signed)
Summary: 4 MTH FU STC   Vital Signs:  Patient profile:   77 year old female Height:      65 inches (165.10 cm) Weight:      139.0 pounds (63.18 kg) BMI:     23.21 O2 Sat:      96 % on Room air Temp:     98.6 degrees F (37.00 degrees C) oral Pulse rate:   77 / minute BP sitting:   130 / 72  (left arm) Cuff size:   regular  Vitals Entered By: Orlan Leavens RMA (February 03, 2011 9:56 AM)  O2 Flow:  Room air CC: 4 month follow-up Is Patient Diabetic? No Pain Assessment Patient in pain? no        Primary Care Provider:  Newt Lukes MD  CC:  4 month follow-up.  History of Present Illness: here for f/u  1) depression - largely exac by family stressors - spouse and son started on effexor (generic) low dose 3/30 - tol without adv effects - titration up poorly tol- stopped medication herself due to headache and insomnia SE - note also intol of zoloft, prozac and wellbutrin - recently est with been to [facility] - tried citalopram from [facility] - lisa polis - but dismissed from f/u "b/c i wasn't taking the meds" off allmedds since fall 2011- tx self with exercise endorphins and feels "better than i ever have"  2) HTN - stopped amlodipine indep-  on ACEI + diuretic - home log reviewed - SBP 110s-140s, DBP 60-70s -no signs or symptoms of problem such as HA, CP or SOB -   3) dyslipidemia - reports compliance with ongoing medical treatment (statin) and no changes in medication dose or frequency. denies adverse side effects related to current therapy.    Preventive Screening-Counseling & Management  Alcohol-Tobacco     Alcohol drinks/day: 0     Alcohol Counseling: not indicated; patient does not drink     Smoking Status: quit     Tobacco Counseling: not indicated; no tobacco use  Caffeine-Diet-Exercise     Does Patient Exercise: yes     Exercise Counseling: not indicated; exercise is adequate     Depression Counseling: not indicated; screening  negative for depression  Clinical Review Panels:  Immunizations   Last Tetanus Booster:  Td (06/30/2009)   Last Flu Vaccine:  Fluvax 3+ (09/09/2010)   Last Pneumovax:  Pneumovax (Medicare) (02/10/2010)  Lipid Management   Cholesterol:  175 (02/10/2010)   LDL (bad choesterol):  74 (02/10/2010)   HDL (good cholesterol):  82.10 (02/10/2010)  CBC   WBC:  10.5 (08/18/2009)   RBC:  4.30 (08/18/2009)   Hgb:  13.9 (08/18/2009)   Hct:  40.2 (08/18/2009)   Platelets:  229 (08/18/2009)   MCV  93.5 (08/18/2009)   MCHC  34.3 (06/30/2009)   RDW  13.6 (08/18/2009)   PMN:  81 (08/18/2009)   Lymphs:  25.1 (06/30/2009)   Monos:  7 (08/18/2009)   Eosinophils:  1 (08/18/2009)   Basophil:  0 (08/18/2009)  Complete Metabolic Panel   Glucose:  138 (08/18/2009)   Sodium:  143 (08/18/2009)   Potassium:  3.6 (08/18/2009)   Chloride:  104 (08/18/2009)   CO2:  31 (08/18/2009)   BUN:  18 (08/18/2009)   Creatinine:  1.25 (08/18/2009)   Albumin:  4.1 (08/18/2009)   Total Protein:  7.0 (08/18/2009)   Calcium:  10.9 (08/18/2009)   Total Bili:  1.1 (08/18/2009)   Alk  Phos:  54 (08/18/2009)   SGPT (ALT):  28 (08/18/2009)   SGOT (AST):  34 (08/18/2009)   Current Medications (verified): 1)  Lisinopril 10 Mg Tabs (Lisinopril) .Marland Kitchen.. 1 Tablet By Mouth Once Daily 2)  Triamterene-Hctz 37.5-25 Mg Tabs (Triamterene-Hctz) .Marland Kitchen.. 1 By Mouth Once Daily 3)  Multivitamins   Tabs (Multiple Vitamin) .Marland Kitchen.. 1 Tablet By Mouth Once Daily 4)  Aspirin 81 Mg Tbec (Aspirin) .Marland Kitchen.. 1 Tablet By Mouth Once Daily - Hold Until Further Notice 5)  Citracal/vitamin D 250-200 Mg-Unit Tabs (Calcium Citrate-Vitamin D) .... 2 By Mouth Two Times A Day 6)  Lipitor 10 Mg Tabs (Atorvastatin Calcium) .Marland Kitchen.. 1 Tablet By Mouth Once Daily 7)  Phillips Colon Health  Caps (Probiotic Product) .... Take 1 By Mouth Once Daily  Allergies (verified): 1)  ! * Lialda 2)  ! * Diphen/atrophine  Past History:  Past Medical  History: Diverticulosis Hemorrhoids Kidney Stones Adenomatous Colon Polyps 06/2002 Anxiety Disorder Collagenous colitis 2010 Allergic rhinitis Asthma Colonic polyps, hx of  Depression Hyperlipidemia  Hypertension  MD roster: gyn - [facility] GI - mann psyc - none now- prev, lisa poulos ([facility]) uro - kimbrough  Review of Systems  The patient denies fever, weight loss, chest pain, and abdominal pain.    Physical Exam  General:  alert, well-developed, well-nourished, and cooperative to examination.    Lungs:  normal respiratory effort, no intercostal retractions or use of accessory muscles; normal breath sounds bilaterally - no crackles and no wheezes.    Heart:  normal rate, regular rhythm, no murmur, and no rub. BLE without edema but chronic left ankle and foot enlargement as compared to right - Psych:  Oriented X3, memory intact for recent and remote, normally interactive, good eye contact, not anxious appearing, not depressed appearing - very bright compared to prior OVs   Impression & Recommendations:  Problem # 1:  HYPERTENSION (ICD-401.9)  Her updated medication list for this problem includes:    Lisinopril 10 Mg Tabs (Lisinopril) .Marland Kitchen... 1 tablet by mouth once daily    Triamterene-hctz 37.5-25 Mg Tabs (Triamterene-hctz) .Marland Kitchen... 1 by mouth once daily  BP today: 130/72 Prior BP: 138/70 (09/29/2010)  Labs Reviewed: K+: 3.6 (08/18/2009) Creat: : 1.25 (08/18/2009)   Chol: 175 (02/10/2010)   HDL: 82.10 (02/10/2010)   LDL: 74 (02/10/2010)   TG: 94.0 (02/10/2010)  Problem # 2:  HYPERLIPIDEMIA (ICD-272.4)  Her updated medication list for this problem includes:    Lipitor 10 Mg Tabs (Atorvastatin calcium) .Marland Kitchen... 1 tablet by mouth once daily  Labs Reviewed: SGOT: 34 (08/18/2009)   SGPT: 28 (08/18/2009)   HDL:82.10 (02/10/2010)  LDL:74 (02/10/2010)  Chol:175 (02/10/2010), 163 (06/30/2009)  Trig:94.0 (02/10/2010)  Problem # 3:  DEPRESSION (ICD-311)  The following  medications were removed from the medication list:    Fluoxetine Hcl 10 Mg Caps (Fluoxetine hcl) .Marland Kitchen... Take 1 by mouth once daily  Complete Medication List: 1)  Lisinopril 10 Mg Tabs (Lisinopril) .Marland Kitchen.. 1 tablet by mouth once daily 2)  Triamterene-hctz 37.5-25 Mg Tabs (Triamterene-hctz) .Marland Kitchen.. 1 by mouth once daily 3)  Multivitamins Tabs (Multiple vitamin) .Marland Kitchen.. 1 tablet by mouth once daily 4)  Aspirin 81 Mg Tbec (Aspirin) .Marland Kitchen.. 1 tablet by mouth once daily - hold until further notice 5)  Citracal/vitamin D 250-200 Mg-unit Tabs (Calcium citrate-vitamin d) .... 2 by mouth two times a day 6)  Lipitor 10 Mg Tabs (Atorvastatin calcium) .Marland Kitchen.. 1 tablet by mouth once daily 7)  Phillips Colon Health Caps (Probiotic product) .Marland KitchenMarland KitchenMarland Kitchen  Take 1 by mouth once daily  Patient Instructions: 1)  it was good to see you today. 2)  reviewed medications today - no changes 3)  Please schedule a follow-up appointment in 4-6 months to review blood pressure and check cholesterol, call sooner if problems.    Orders Added: 1)  Est. Patient Level III [16109]

## 2011-03-10 ENCOUNTER — Encounter: Payer: Self-pay | Admitting: Internal Medicine

## 2011-03-11 ENCOUNTER — Encounter: Payer: Self-pay | Admitting: Internal Medicine

## 2011-03-11 ENCOUNTER — Ambulatory Visit (INDEPENDENT_AMBULATORY_CARE_PROVIDER_SITE_OTHER): Payer: Medicare Other | Admitting: Internal Medicine

## 2011-03-11 ENCOUNTER — Ambulatory Visit (INDEPENDENT_AMBULATORY_CARE_PROVIDER_SITE_OTHER)
Admission: RE | Admit: 2011-03-11 | Discharge: 2011-03-11 | Disposition: A | Discharge status: Home / Self Care | Payer: Medicare Other | Source: Ambulatory Visit | Attending: Internal Medicine | Admitting: Internal Medicine

## 2011-03-11 DIAGNOSIS — F411 Generalized anxiety disorder: Secondary | ICD-10-CM

## 2011-03-11 DIAGNOSIS — M533 Sacrococcygeal disorders, not elsewhere classified: Secondary | ICD-10-CM

## 2011-03-11 DIAGNOSIS — G4700 Insomnia, unspecified: Secondary | ICD-10-CM | POA: Insufficient documentation

## 2011-03-11 MED ORDER — ZOLPIDEM TARTRATE 5 MG PO TABS: 5.0000 mg | ORAL_TABLET | Freq: Every evening | ORAL | Status: DC | PRN

## 2011-03-11 NOTE — Assessment & Plan Note (Signed)
Use ambien as needed - new rx provided

## 2011-03-11 NOTE — Patient Instructions (Signed)
It was good to see you today. Test(s) ordered today. Your results will be called to you after review (48-72hours after test completion). If any changes need to be made, you will be notified at that time. Use tylenol or ibuprofen as needed for arthritis symptoms - call if pain unimproved, sooner if worse Try generic ambien as needed for sleep - Your prescription(s) have been submitted to your pharmacy. Please take as directed and contact our office if you believe you are having problem(s) with the medication(s).

## 2011-03-11 NOTE — Assessment & Plan Note (Signed)
Intol of all SSRI and SRNI meds Will try ambein as needed for associated sleep problems

## 2011-03-11 NOTE — Assessment & Plan Note (Signed)
Check xray Advised tylenol/ibuprofen as needed No red flags on hx or exam

## 2011-03-11 NOTE — Progress Notes (Signed)
  Subjective:    Patient ID: Erika Gibson, female    DOB: [birth year], 77 y.o.   MRN: [id number]  HPI  here for insomnia Also complains of B pelvic/hip pain No trauma or falls - No groin pain or back pain Worse with activity (bending forward, working on knees) Relieved with rest but not complete resolution  Also reviewed chronic med issues: 1) depression - largely exac by family stressors - spouse and son started on effexor (generic) low dose 3/30 - stopped medication herself due to headache and insomnia SE - note also intol of zoloft, prozac and wellbutrin - recently est with been to [facility] - tried citalopram from [facility] provider - lisa polis - but dismissed from f/u "b/c i wasn't taking the meds" off all meds since fall 2011- tx self with exercise endorphins and feels "better than i ever have"  2) HTN - stopped amlodipine indep-   on ACEI + diuretic - home log reviewed - SBP 110s-140s, DBP 60-70s -no signs or symptoms of problem such as HA, CP or SOB -   3) dyslipidemia - reports compliance with ongoing medical treatment (statin) and no changes in medication dose or frequency. denies adverse side effects related to current therapy.   Past Medical History  Diagnosis Date  . HYPERLIPIDEMIA 06/30/2009  . Anxiety state, unspecified 10/14/2009  . DEPRESSION 06/30/2009  . HYPERTENSION 06/30/2009  . ASTHMA 06/30/2009  . RENAL CALCULUS 08/18/2009  . FATIGUE 06/30/2009  . COLONIC POLYPS, HX OF 03/24/2009  . Colitis, collagenous      Review of Systems  Constitutional: Negative for fever.  Respiratory: Negative for cough and shortness of breath.   Cardiovascular: Negative for chest pain.  Gastrointestinal: Negative for abdominal pain. No other specific complaints except as listed in HPI above    Objective:   Physical Exam BP 130/68  Pulse 79  Temp(Src) 98.5 F (36.9 C) (Oral)  Ht 5\' 5"  (1.651 m)  Wt 134 lb 1.9 oz (60.836 kg)  BMI 22.32 kg/m2 Gen: NAD,  nontoxic Back: full range of motion of thoracic and lumbar spine. Non tender to palpation. Negative straight leg raise. DTR's are symmetrically intact. Sensation intact in all dermatomes of the lower extremities. Full strength to manual muscle testing including the EHL, anterior tibialis, gastrocnemius, quadricepts, and iliopsoas. the patient is able to heel toe walk without difficulty and ambulates with a normal gait. Hips: B FROM, no groin pain Lungs: CTA B CV: RRR Psyc: mildly anxious     Assessment & Plan:  See problem list. Medications and labs reviewed today.

## 2011-03-14 ENCOUNTER — Telehealth: Payer: Self-pay | Admitting: Internal Medicine

## 2011-03-14 NOTE — Telephone Encounter (Signed)
Please call patient - mild arthritis on xray results. No medication changes recommended; call if symptoms worse or unimproved. Thanks.

## 2011-03-14 NOTE — Telephone Encounter (Signed)
Notified pt with xray results...03/14/11@11 :24am/LMB

## 2011-03-18 LAB — COMPREHENSIVE METABOLIC PANEL
ALT: 28 U/L (ref 0–35)
AST: 34 U/L (ref 0–37)
Albumin: 4.1 g/dL (ref 3.5–5.2)
Alkaline Phosphatase: 54 U/L (ref 39–117)
CO2: 31 mEq/L (ref 19–32)
Chloride: 104 mEq/L (ref 96–112)
GFR calc Af Amer: 50 mL/min — ABNORMAL LOW (ref >60)
GFR calc non Af Amer: 41 mL/min — ABNORMAL LOW (ref >60)
Potassium: 3.6 mEq/L (ref 3.5–5.1)
Total Bilirubin: 1.1 mg/dL (ref 0.3–1.2)

## 2011-03-18 LAB — URINALYSIS, ROUTINE W REFLEX MICROSCOPIC
Bilirubin Urine: NEGATIVE
Ketones, ur: NEGATIVE mg/dL
Specific Gravity, Urine: 1.018 (ref 1.005–1.030)
Urobilinogen, UA: 0.2 mg/dL (ref 0.0–1.0)

## 2011-03-18 LAB — DIFFERENTIAL
Basophils Absolute: 0 10*3/uL (ref 0.0–0.1)
Basophils Relative: 0 % (ref 0–1)
Eosinophils Absolute: 0.1 10*3/uL (ref 0.0–0.7)
Eosinophils Relative: 1 % (ref 0–5)
Monocytes Absolute: 0.8 10*3/uL (ref 0.1–1.0)

## 2011-03-18 LAB — CBC
MCV: 93.5 fL (ref 78.0–100.0)
Platelets: 229 10*3/uL (ref 150–400)
RBC: 4.3 MIL/uL (ref 3.87–5.11)
WBC: 10.5 10*3/uL (ref 4.0–10.5)

## 2011-03-18 LAB — LIPASE, BLOOD: Lipase: 28 U/L (ref 11–59)

## 2011-03-18 LAB — URINE MICROSCOPIC-ADD ON

## 2011-04-29 NOTE — Op Note (Signed)
[facility]  Patient:    Erika Gibson, Erika Gibson                         MRN: [id number] Proc. Date: 06/23/00 Adm. Date:  [id number] Attending:  Mickle Mallory                           Operative Report  PATIENTS AGE:                77.  PREOPERATIVE DIAGNOSIS:       Postmenopausal bleeding.  POSTOPERATIVE DIAGNOSIS:      Postmenopausal bleeding.  PROCEDURE:                    Examination under anesthesia and dilatation and                               curettage.  SURGEON:                      Gerrit Friends. Aldona Bar, M.D.  ANESTHESIA:                   Intravenous conscious sedation and paracervical                               block with 1% Xylocaine without epinephrine.  DESCRIPTION OF PROCEDURE:     The patient was taken to the operating room, where after the satisfactory induction of intravenous conscious sedation, she was prepped and draped, having been placed in the modified lithotomy position in the short Allen stirrups.  After she was appropriately draped, examination carried out with findings consistent with a uterus that was upper limits of normal size but no adnexal masses appreciated.                                At this time, a speculum was placed in the vagina and a single-tooth tenaculum placed on the anterior lip of the cervix. The paracervical block at this time was carried out in typical fashion, using 11 cc of 1% Xylocaine without epinephrine.  Thereafter, using the [facility] dilators, the internal os was dilated to a #27 without difficulty. Thereafter, using the Randall stone forceps, the cavity was thoroughly gently and systematically probed with no tissue produced.  A small serrated curette was then utilized in a thorough, gentle, and systematic fashion, and the walls of the cavity were curetted.  Minimal to moderate tissue was produced.  The cavity felt quite symmetrical.  This was carried out with intermittent probing with the  Randall stone forceps until felt to be complete.  Thereafter, all instruments were removed, patient was reexamined, and thereafter transferred to the recovery room in satisfactory condition, having tolerated the procedure well.  Estimated blood loss negligible.  All counts correct x 2.  Pathologic specimen consisted of endometrial curettings.  The patient will be discharged from the recovery area to home, will resume all her medications, and will return to the office for follow-up as scheduled. DD:  06/23/00 TD:  06/23/00 Job: [id number]

## 2011-06-10 ENCOUNTER — Encounter: Payer: Self-pay | Admitting: Internal Medicine

## 2011-06-10 ENCOUNTER — Ambulatory Visit (INDEPENDENT_AMBULATORY_CARE_PROVIDER_SITE_OTHER): Payer: Medicare Other | Admitting: Internal Medicine

## 2011-06-10 VITALS — BP 122/62 | HR 73 | Temp 98.8°F | Ht 65.0 in | Wt 132.4 lb

## 2011-06-10 DIAGNOSIS — I10 Essential (primary) hypertension: Secondary | ICD-10-CM

## 2011-06-10 DIAGNOSIS — F329 Major depressive disorder, single episode, unspecified: Secondary | ICD-10-CM

## 2011-06-10 DIAGNOSIS — E785 Hyperlipidemia, unspecified: Secondary | ICD-10-CM

## 2011-06-10 MED ORDER — AMITRIPTYLINE HCL 10 MG PO TABS: 10.0000 mg | ORAL_TABLET | Freq: Every day | ORAL | Status: DC

## 2011-06-10 NOTE — Assessment & Plan Note (Signed)
On statin - tolerating well The current medical regimen is effective;  continue present plan and medications.    

## 2011-06-10 NOTE — Progress Notes (Signed)
  Subjective:    Patient ID: Erika Gibson, female    DOB: 05-Oct-1928, 77 y.o.   MRN: [id number]  HPI   here follow up - reviewed chronic med issues:  depression - largely exac by family stressors - spouse (dx alz) and son (MI in 2/12) note also intol of zoloft, prozac and wellbutrin - failed effexor (generic) low dose 03/10/10 Then tried citalopram from [facility] - lisa polis - caused anxiety off all meds since fall 2011- was tx self with "exercise endorphins" and was feeling better associated with insomnia - uses prn ambien for same  HTN - stopped amlodipine indep-   on ACEI + diuretic - home log reviewed - SBP 110s-140s, DBP 60-70s -no signs or symptoms of problem such as HA, CP or SOB -   dyslipidemia - reports compliance with ongoing medical treatment (statin) and no changes in medication dose or frequency. denies adverse side effects related to current therapy.   Past Medical History  Diagnosis Date  . Colitis, collagenous 2010  . RENAL CALCULUS   . HYPERTENSION   . HYPERLIPIDEMIA   . DEPRESSION   . ASTHMA   . Anxiety state, unspecified   . COLONIC POLYPS, HX OF 06/2002    adenomatous polyp    Review of Systems Constitutional: Negative for fever.  Respiratory: Negative for cough and shortness of breath.   Cardiovascular: Negative for chest pain.  Gastrointestinal: Negative for abdominal pain. No other specific complaints except as listed in HPI above    Objective:   Physical Exam  BP 122/62  Pulse 73  Temp(Src) 98.8 F (37.1 C) (Oral)  Ht 5\' 5"  (1.651 m)  Wt 132 lb 6.4 oz (60.056 kg)  BMI 22.03 kg/m2  SpO2 92% Gen: NAD, nontoxic Lungs: CTA B CV: RRR Psyc: mildly anxious and depressed  Lab Results  Component Value Date   WBC 10.5 08/18/2009   HGB 13.9 08/18/2009   HCT 40.2 08/18/2009   PLT 229 08/18/2009   CHOL 175 02/10/2010   TRIG 94.0 02/10/2010   HDL 82.10 02/10/2010   ALT 28 08/18/2009   AST 34 08/18/2009   NA 143 08/18/2009   K 3.6 08/18/2009   CL 104  08/18/2009   CREATININE 1.25* 08/18/2009   BUN 18 08/18/2009   CO2 31 08/18/2009   TSH 1.62 02/10/2010       Assessment & Plan:  See problem list. Medications and labs reviewed today.

## 2011-06-10 NOTE — Assessment & Plan Note (Signed)
Intol prior trials: zoloft, prozac, wellbutrin, effexor and citalopram  Low dose amitriptyline 10mg  qhs to also help with insomnia issues - ok to use with ambein if needed Risk/benefit discussed and pt agrees to same Continue with counseling and follow up 6 weeks, sooner if problem

## 2011-06-10 NOTE — Patient Instructions (Signed)
It was good to see you today. Try amitriptyline for sleep and mood stabilizer - take as discussed - Your prescription(s) have been submitted to your pharmacy. Please take as directed and contact our office if you believe you are having problem(s) with the medication(s). Other medications reviewed, no other changes at this time. Please schedule followup in 6 weeks, call sooner if problems.

## 2011-06-10 NOTE — Assessment & Plan Note (Signed)
BP Readings from Last 3 Encounters:  06/10/11 122/62  03/11/11 130/68  02/03/11 130/72   The current medical regimen is effective; doing well without amlodipine continue present plan and medications.

## 2011-07-20 ENCOUNTER — Encounter: Payer: Self-pay | Admitting: Internal Medicine

## 2011-07-20 ENCOUNTER — Ambulatory Visit (INDEPENDENT_AMBULATORY_CARE_PROVIDER_SITE_OTHER): Payer: Medicare Other | Admitting: Internal Medicine

## 2011-07-20 VITALS — BP 132/72 | HR 80 | Temp 98.4°F | Ht 65.0 in | Wt 128.0 lb

## 2011-07-20 DIAGNOSIS — F329 Major depressive disorder, single episode, unspecified: Secondary | ICD-10-CM

## 2011-07-20 MED ORDER — ALPRAZOLAM 0.25 MG PO TABS: 0.2500 mg | ORAL_TABLET | Freq: Three times a day (TID) | ORAL | Status: DC | PRN

## 2011-07-20 NOTE — Assessment & Plan Note (Signed)
Intol prior trials: zoloft, prozac, wellbutrin, effexor, citalopram and elavil Low dose xanax trail to use prn anxiety attacks, not daily- ok to use with ambein if needed Risk/benefit discussed and pt agrees to same Continue with counseling and follow up 6 weeks, sooner if problem

## 2011-07-20 NOTE — Progress Notes (Signed)
  Subjective:    Patient ID: Erika Gibson, female    DOB: [birth year], 77 y.o.   MRN: [id number]  HPI   here follow up - reviewed chronic med issues:  depression - largely exac by family stressors - spouse (dx alz) and son (MI in 2/12) note also intol of zoloft, prozac and wellbutrin - failed effexor (generic) low dose 03/10/10 Then tried citalopram from [facility] - lisa polis - caused anxiety tried tx self with "exercise endorphins" and was feeling better Now associated with insomnia due to anxiety/panic attacks - uses prn ambien for same - but wakes up tired poor tol of low dose amitrip 06/2011 (hangover, constipation) No SI/HI  HTN - stopped amlodipine indep-   on ACEI + diuretic - home log reviewed - SBP 110s-140s, DBP 60-70s -no signs or symptoms of problem such as HA, CP or SOB -   dyslipidemia - reports compliance with ongoing medical treatment (statin) and no changes in medication dose or frequency. denies adverse side effects related to current therapy.   Past Medical History  Diagnosis Date  . Colitis, collagenous 2010  . RENAL CALCULUS   . HYPERTENSION   . HYPERLIPIDEMIA   . DEPRESSION   . ASTHMA   . Anxiety state, unspecified   . COLONIC POLYPS, HX OF 06/2002    adenomatous polyp    Review of Systems Constitutional: Positive for fatigue Respiratory: Negative for cough and shortness of breath.   Cardiovascular: Negative for chest pain.  Gastrointestinal: Negative for abdominal pain.     Objective:   Physical Exam  BP 132/72  Pulse 80  Temp(Src) 98.4 F (36.9 C) (Oral)  Ht 5\' 5"  (1.651 m)  Wt 128 lb (58.06 kg)  BMI 21.30 kg/m2  SpO2 98% Gen: NAD, nontoxic Lungs: CTA B CV: RRR Psyc: mildly anxious and depressed  Lab Results  Component Value Date   WBC 10.5 08/18/2009   HGB 13.9 08/18/2009   HCT 40.2 08/18/2009   PLT 229 08/18/2009   CHOL 175 02/10/2010   TRIG 94.0 02/10/2010   HDL 82.10 02/10/2010   ALT 28 08/18/2009   AST 34 08/18/2009   NA 143  08/18/2009   K 3.6 08/18/2009   CL 104 08/18/2009   CREATININE 1.25* 08/18/2009   BUN 18 08/18/2009   CO2 31 08/18/2009   TSH 1.62 02/10/2010       Assessment & Plan:  See problem list. Medications and labs reviewed today.

## 2011-07-20 NOTE — Patient Instructions (Signed)
It was good to see you today. Try low dose generic xanax as needed for anxiety attacks - take as discussed - Your prescription(s) have been submitted to your pharmacy. Please take as directed and contact our office if you believe you are having problem(s) with the medication(s). Other medications reviewed, no other changes at this time. Please schedule followup in 6 weeks, call sooner if problems.

## 2011-07-22 ENCOUNTER — Ambulatory Visit: Payer: Medicare Other | Admitting: Internal Medicine

## 2011-09-06 ENCOUNTER — Ambulatory Visit (INDEPENDENT_AMBULATORY_CARE_PROVIDER_SITE_OTHER): Payer: Medicare Other | Admitting: Internal Medicine

## 2011-09-06 ENCOUNTER — Encounter: Payer: Self-pay | Admitting: Internal Medicine

## 2011-09-06 VITALS — BP 120/70 | HR 68 | Temp 97.7°F | Ht 63.0 in | Wt 132.0 lb

## 2011-09-06 DIAGNOSIS — Z23 Encounter for immunization: Secondary | ICD-10-CM

## 2011-09-06 DIAGNOSIS — E785 Hyperlipidemia, unspecified: Secondary | ICD-10-CM

## 2011-09-06 DIAGNOSIS — F329 Major depressive disorder, single episode, unspecified: Secondary | ICD-10-CM

## 2011-09-06 DIAGNOSIS — I10 Essential (primary) hypertension: Secondary | ICD-10-CM

## 2011-09-06 NOTE — Patient Instructions (Signed)
It was good to see you today. continue low dose generic xanax as needed for anxiety attacks -  Other medications reviewed, no other changes at this time. Please schedule followup in 3 months, call sooner if problems.

## 2011-09-06 NOTE — Assessment & Plan Note (Signed)
On statin - tolerating well - labs followed by gyn The current medical regimen is effective;  continue present plan and medications.

## 2011-09-06 NOTE — Assessment & Plan Note (Signed)
Intol prior trials: zoloft, prozac, wellbutrin, effexor, citalopram and elavil Low dose xanax trail to use prn anxiety attacks, not daily- ok to use with ambein if needed Risk/benefit discussed and pt agrees to same Continue with counseling as needed follow up 3months, sooner if problem

## 2011-09-06 NOTE — Progress Notes (Signed)
  Subjective:    Patient ID: Erika Gibson, female    DOB: [birth year], 77 y.o.   MRN: [id number]  HPI  here for follow up - reviewed chronic med issues:  depression - largely exac by family stressors - spouse (dx alz dementia) and son (MI in 2/12) prev intol of zoloft, prozac and wellbutrin - failed effexor (generic) low dose 03/10/10 tried citalopram from [facility] - lisa polis - caused anxiety - stopped same tried tx self with "exercise endorphins" and was feeling better Now associated with insomnia due to anxiety/panic attacks - uses prn ambien for same - but wakes up tired poor tol of low dose amitrip 06/2011 (hangover, constipation) Since 07/2011, uses low dose prn xanax - only 3 episodes needed since last OV 6 weeks ago - feels well No SI/HI  HTN - stopped amlodipine indep early 2012-   on ACEI + diuretic - home log reviewed - SBP 110s-140s, DBP 60-70s -no signs or symptoms of problem such as HA, CP or SOB -   dyslipidemia - reports compliance with ongoing medical treatment (statin) and no changes in medication dose or frequency. denies adverse side effects related to current therapy.   Past Medical History  Diagnosis Date  . Colitis, collagenous 2010  . RENAL CALCULUS   . HYPERTENSION   . HYPERLIPIDEMIA   . DEPRESSION   . ASTHMA   . Anxiety state, unspecified   . COLONIC POLYPS, HX OF 06/2002    adenomatous polyp    Review of Systems  Constitutional: Negative for fatigue Respiratory: Negative for cough and shortness of breath.   Cardiovascular: Negative for chest pain.  Gastrointestinal: Negative for abdominal pain.     Objective:   Physical Exam  BP 120/70  Pulse 68  Temp(Src) 97.7 F (36.5 C) (Oral)  Ht 5\' 3"  (1.6 m)  Wt 132 lb (59.875 kg)  BMI 23.38 kg/m2  SpO2 95% Gen: NAD, nontoxic Lungs: CTA B, no w/c CV: RRR, no BLE edema Psyc: calm - not anxious or depressed  Lab Results  Component Value Date   WBC 10.5 08/18/2009   HGB 13.9 08/18/2009   HCT 40.2 08/18/2009   PLT 229 08/18/2009   CHOL 175 02/10/2010   TRIG 94.0 02/10/2010   HDL 82.10 02/10/2010   ALT 28 08/18/2009   AST 34 08/18/2009   NA 143 08/18/2009   K 3.6 08/18/2009   CL 104 08/18/2009   CREATININE 1.25* 08/18/2009   BUN 18 08/18/2009   CO2 31 08/18/2009   TSH 1.62 02/10/2010       Assessment & Plan:  See problem list. Medications and labs reviewed today.  Time spent with pt  today 25 minutes, greater than 50% time spent counseling patient on anxiety+ depression, lipids/lipitor and blood pressure as well as medication review. Also review of prior records

## 2011-09-06 NOTE — Assessment & Plan Note (Signed)
BP Readings from Last 3 Encounters:  09/06/11 120/70  07/20/11 132/72  06/10/11 122/62   The current medical regimen is effective; doing well without amlodipine continue present plan and medications.

## 2011-10-17 ENCOUNTER — Encounter: Payer: Self-pay | Admitting: Internal Medicine

## 2011-10-17 ENCOUNTER — Ambulatory Visit (INDEPENDENT_AMBULATORY_CARE_PROVIDER_SITE_OTHER): Payer: Medicare Other | Admitting: Internal Medicine

## 2011-10-17 VITALS — BP 132/70 | HR 88 | Temp 98.5°F

## 2011-10-17 DIAGNOSIS — F329 Major depressive disorder, single episode, unspecified: Secondary | ICD-10-CM

## 2011-10-17 MED ORDER — ALPRAZOLAM 0.25 MG PO TABS: 0.2500 mg | ORAL_TABLET | Freq: Three times a day (TID) | ORAL | Status: DC | PRN

## 2011-10-17 NOTE — Assessment & Plan Note (Addendum)
Exac by spouse's rapidly progressing dementia Intol prior trials: zoloft, prozac, wellbutrin, effexor, citalopram and elavil Started 07/2011 on low dose xanax trail to use prn anxiety attacks - doing well, reassured re: safety Support offered follow up 3months, sooner if problem

## 2011-10-17 NOTE — Progress Notes (Signed)
  Subjective:    Patient ID: Erika Gibson, female    DOB: June 06, 1928, 77 y.o.   MRN: [id number]  HPI  here for increase in anxiety symptoms - exac by spouse's rapidly progressive dementia (decline)  also reviewed chronic med issues:  depression - largely exac by family stressors: spouse (dx alz dementia) and son (MI in 2/12) prev intol of zoloft, prozac and wellbutrin - also intolerant of effexor (generic) low dose 03/10/10 tried citalopram later 2011 from [facility] - lisa polis > caused anxiety > stopped same tried tx self with "exercise endorphins" and was feeling better until unable to exercise (need to stay home to watch spouse for saftey) Now associated with insomnia due to anxiety/panic attacks - used prn ambien for same poor tol of low dose amitrip 06/2011 (hangover, constipation) Since 07/2011, uses low dose prn xanax - working well but worried about using "too much"  (25mg /d now) No SI/HI  HTN - stopped amlodipine indep early 2012-   on ACEI + diuretic - home log reviewed - SBP 110s-140s, DBP 60-70s -no signs or symptoms of problem such as headache, chest pain or shortness of breath -   dyslipidemia - reports compliance with ongoing medical treatment (statin) and no changes in medication dose or frequency. denies adverse side effects related to current therapy.   Past Medical History  Diagnosis Date  . Colitis, collagenous 2010  . RENAL CALCULUS   . HYPERTENSION   . HYPERLIPIDEMIA   . DEPRESSION   . ASTHMA   . Anxiety state, unspecified   . COLONIC POLYPS, HX OF 06/2002    adenomatous polyp    Review of Systems  Constitutional: Negative for fatigue Respiratory: Negative for cough and shortness of breath.   Cardiovascular: Negative for chest pain.  Gastrointestinal: Negative for abdominal pain.     Objective:   Physical Exam  BP 132/70  Pulse 88  Temp(Src) 98.5 F (36.9 C) (Oral)  SpO2 94% Gen: NAD, nontoxic Lungs: CTA B, no w/c CV: RRR, no BLE  edema Psyc: mildly anxious - appropriately depressed  Lab Results  Component Value Date   WBC 10.5 08/18/2009   HGB 13.9 08/18/2009   HCT 40.2 08/18/2009   PLT 229 08/18/2009   CHOL 175 02/10/2010   TRIG 94.0 02/10/2010   HDL 82.10 02/10/2010   ALT 28 08/18/2009   AST 34 08/18/2009   NA 143 08/18/2009   K 3.6 08/18/2009   CL 104 08/18/2009   CREATININE 1.25* 08/18/2009   BUN 18 08/18/2009   CO2 31 08/18/2009   TSH 1.62 02/10/2010       Assessment & Plan:  See problem list. Medications and labs reviewed today.

## 2011-10-17 NOTE — Patient Instructions (Signed)
It was good to see you today. continue low dose generic xanax as needed for anxiety attacks -  if you are using more than 3 pills/day, call and we will consider other therapy Other medications reviewed, no other changes at this time.

## 2011-10-25 ENCOUNTER — Encounter: Payer: Self-pay | Admitting: Internal Medicine

## 2011-10-25 LAB — HM MAMMOGRAPHY

## 2011-10-28 ENCOUNTER — Encounter: Payer: Self-pay | Admitting: Internal Medicine

## 2011-12-07 ENCOUNTER — Ambulatory Visit (INDEPENDENT_AMBULATORY_CARE_PROVIDER_SITE_OTHER): Payer: Medicare Other | Admitting: Internal Medicine

## 2011-12-07 ENCOUNTER — Encounter: Payer: Self-pay | Admitting: Internal Medicine

## 2011-12-07 VITALS — BP 118/70 | HR 96 | Temp 98.4°F | Wt 131.2 lb

## 2011-12-07 DIAGNOSIS — E785 Hyperlipidemia, unspecified: Secondary | ICD-10-CM

## 2011-12-07 DIAGNOSIS — R059 Cough, unspecified: Secondary | ICD-10-CM

## 2011-12-07 DIAGNOSIS — F411 Generalized anxiety disorder: Secondary | ICD-10-CM

## 2011-12-07 DIAGNOSIS — R05 Cough: Secondary | ICD-10-CM

## 2011-12-07 MED ORDER — OMEPRAZOLE 20 MG PO CPDR: 20.0000 mg | DELAYED_RELEASE_CAPSULE | Freq: Every day | ORAL | Status: DC

## 2011-12-07 NOTE — Progress Notes (Signed)
  Subjective:    Patient ID: Erika Gibson, female    DOB: Sep 07, 1928, 77 y.o.   MRN: [id number]  HPI  here for cough Ongoing x 3 weeks Describes as dry - worse with meals No fever - no sputum  also reviewed chronic med issues:  Depression/anxiety - largely exac by family stressors: spouse (rapidly progressive alz dementia) and son (MI in 2/12); prev intol of zoloft, prozac and wellbutrin - also intolerant of effexor (generic) low dose 03/10/10; tried citalopram later 2011 from [facility] provider - lisa polis > caused anxiety > stopped same; tried tx self with "exercise endorphins" and was feeling better until unable to exercise (need to stay home to watch spouse for saftey) associated with insomnia due to anxiety/panic attacks - used prn ambien for same poor tol of low dose amitrip 06/2011 (hangover, constipation) Since 07/2011, uses low dose prn xanax - working well but worried about using "too much"  (25mg /d now) No SI/HI  HTN - stopped amlodipine indep early 2012-   on ACEI + diuretic - home log reviewed - SBP 110s-140s, DBP 60-70s -no signs or symptoms of problem such as headache, chest pain or shortness of breath -   dyslipidemia - reports compliance with ongoing medical treatment (statin) and no changes in medication dose or frequency. denies adverse side effects related to current therapy.   Past Medical History  Diagnosis Date  . Colitis, collagenous 2010  . RENAL CALCULUS   . HYPERTENSION   . HYPERLIPIDEMIA   . DEPRESSION   . ASTHMA   . Anxiety state, unspecified   . COLONIC POLYPS, HX OF 06/2002    adenomatous polyp    Review of Systems  Constitutional: Negative for fatigue Respiratory: Negative for shortness of breath or hemoptysis.   Cardiovascular: Negative for chest pain or palpitations.  Gastrointestinal: Negative for abdominal pain.     Objective:   Physical Exam  BP 118/70  Pulse 96  Temp(Src) 98.4 F (36.9 C) (Oral)  Wt 131 lb 3.2 oz (59.512 kg)   SpO2 96% Gen: NAD, nontoxic Lungs: CTA B, no w/c CV: RRR, no BLE edema Psyc: mild anxious and appropriately depressed (less than before - better insight)  Lab Results  Component Value Date   WBC 10.5 08/18/2009   HGB 13.9 08/18/2009   HCT 40.2 08/18/2009   PLT 229 08/18/2009   CHOL 175 02/10/2010   TRIG 94.0 02/10/2010   HDL 82.10 02/10/2010   ALT 28 08/18/2009   AST 34 08/18/2009   NA 143 08/18/2009   K 3.6 08/18/2009   CL 104 08/18/2009   CREATININE 1.25* 08/18/2009   BUN 18 08/18/2009   CO2 31 08/18/2009   TSH 1.62 02/10/2010       Assessment & Plan:  See problem list. Medications and labs reviewed today.  Cough - dry, worse with meals -  exam clear - afeb, hx otherwise benign - try otc PPI x 2 weeks - pt to call if worse

## 2011-12-07 NOTE — Assessment & Plan Note (Signed)
On statin - tolerating well - Check labs now The current medical regimen is effective;  continue present plan and medications.  

## 2011-12-07 NOTE — Patient Instructions (Signed)
It was good to see you today. Cholesterol test ordered today - return when fasting for lab only. Your results will be called to you after review (48-72hours after test completion). If any changes need to be made, you will be notified at that time. Try OTC Prilosec (or generic) once daily x 2 weeks, then as needed for cough - if unimporved or worse cough, please let me know Other medications reviewed, no changes at this time.  Please schedule followup in 3 months for depression check, call sooner if problems.

## 2011-12-07 NOTE — Assessment & Plan Note (Signed)
Exac by spouse's rapidly progressing dementia Intol prior trials: zoloft, prozac, wellbutrin, effexor, citalopram and elavil Started 07/2011 on low dose xanax trail to use prn anxiety attacks - doing well, reassured re: safety Support offered follow up 3months, sooner if problem  

## 2011-12-19 ENCOUNTER — Other Ambulatory Visit (INDEPENDENT_AMBULATORY_CARE_PROVIDER_SITE_OTHER): Payer: Medicare Other

## 2011-12-19 DIAGNOSIS — E785 Hyperlipidemia, unspecified: Secondary | ICD-10-CM | POA: Diagnosis not present

## 2011-12-19 LAB — LIPID PANEL
Cholesterol: 155 mg/dL (ref 0–200)
Triglycerides: 55 mg/dL (ref 0.0–149.0)
VLDL: 11 mg/dL (ref 0.0–40.0)

## 2012-01-27 DIAGNOSIS — Z79899 Other long term (current) drug therapy: Secondary | ICD-10-CM | POA: Diagnosis not present

## 2012-02-03 DIAGNOSIS — N951 Menopausal and female climacteric states: Secondary | ICD-10-CM | POA: Diagnosis not present

## 2012-03-06 ENCOUNTER — Encounter: Payer: Self-pay | Admitting: Internal Medicine

## 2012-03-06 ENCOUNTER — Other Ambulatory Visit (INDEPENDENT_AMBULATORY_CARE_PROVIDER_SITE_OTHER): Payer: Medicare Other

## 2012-03-06 ENCOUNTER — Ambulatory Visit (INDEPENDENT_AMBULATORY_CARE_PROVIDER_SITE_OTHER): Payer: Medicare Other | Admitting: Internal Medicine

## 2012-03-06 VITALS — BP 110/62 | HR 79 | Temp 97.1°F | Ht 64.0 in | Wt 131.4 lb

## 2012-03-06 DIAGNOSIS — N189 Chronic kidney disease, unspecified: Secondary | ICD-10-CM

## 2012-03-06 DIAGNOSIS — F411 Generalized anxiety disorder: Secondary | ICD-10-CM | POA: Diagnosis not present

## 2012-03-06 DIAGNOSIS — I10 Essential (primary) hypertension: Secondary | ICD-10-CM | POA: Diagnosis not present

## 2012-03-06 LAB — BASIC METABOLIC PANEL
Calcium: 9.7 mg/dL (ref 8.4–10.5)
Chloride: 102 mEq/L (ref 96–112)
Potassium: 4.2 mEq/L (ref 3.5–5.1)

## 2012-03-06 NOTE — Patient Instructions (Signed)
It was good to see you today. Kidney blood test ordered today - Your results will be called to you after review (48-72hours after test completion). If any changes need to be made, you will be notified at that time. Try OTC Prilosec (or generic) once daily x 2 weeks, then as needed for cough/phlegm - if unimproved or worse cough, please let me know Other medications reviewed, no changes at this time.  Please schedule followup in 6 months for depression check, call sooner if problems.

## 2012-03-06 NOTE — Assessment & Plan Note (Signed)
Baseline Cr 1.6 on labs from gyn office - reviewed today BP controlled - will monitor same Reassurance provided, no specific intervention already on ACEI

## 2012-03-06 NOTE — Assessment & Plan Note (Signed)
Exac by spouse's rapidly progressing dementia Intol prior trials: zoloft, prozac, wellbutrin, effexor, citalopram and elavil Started 07/2011 on low dose xanax trail to use prn anxiety attacks - doing well, reassured re: safety Support offered - follow up 3-6 months, sooner if problem

## 2012-03-06 NOTE — Assessment & Plan Note (Signed)
BP Readings from Last 3 Encounters:  03/06/12 110/62  12/07/11 118/70  10/17/11 132/70   The current medical regimen is effective; doing well without amlodipine continue present plan and medications.

## 2012-03-06 NOTE — Progress Notes (Signed)
  Subjective:    Patient ID: Erika Gibson, female    DOB: 12-May-1928, 78 y.o.   MRN: [id number]  HPI  here for follow up - reviewed chronic med issues:  Depression/anxiety - largely exac by family stressors: spouse (rapidly progressive alz dementia) and son (MI in 2/12); prev intol of zoloft, prozac and wellbutrin - also intolerant of effexor (generic) low dose 03/10/10; tried citalopram later 2011 [facility] - [facility] > caused anxiety > stopped same; tried tx self with "exercise endorphins" and was feeling better until unable to exercise (need to stay home to watch spouse for saftey); associated with insomnia due to anxiety/panic attacks - used prn ambien for same. poor tol of low dose amitrip 06/2011 (hangover, constipation) Since 07/2011, uses low dose prn xanax - working well - occasionally concern about using "too much"  (25mg /d now). No SI/HI  HTN - stopped amlodipine indep early 2012-   on ACEI + diuretic - home log reviewed - SBP 110s-140s, DBP 60-70s -no signs or symptoms of problem such as headache, chest pain or shortness of breath -   dyslipidemia - reports compliance with ongoing medical treatment (statin) and no changes in medication dose or frequency. denies adverse side effects related to current therapy.   Past Medical History  Diagnosis Date  . Colitis, collagenous 2010  . RENAL CALCULUS   . HYPERTENSION   . HYPERLIPIDEMIA   . DEPRESSION   . ASTHMA   . Anxiety state, unspecified   . COLONIC POLYPS, HX OF 06/2002    adenomatous polyp    Review of Systems  Constitutional: Negative for fatigue or unexpected weight change Respiratory: Negative for shortness of breath or hemoptysis.   Cardiovascular: Negative for chest pain or palpitations.  Gastrointestinal: Negative for abdominal pain.     Objective:   Physical Exam  BP 110/62  Pulse 79  Temp(Src) 97.1 F (36.2 C) (Oral)  Ht 5\' 4"  (1.626 m)  Wt 131 lb 6 oz (59.591 kg)  BMI 22.55 kg/m2  SpO2  97%  Gen: NAD, nontoxic Lungs: CTA B, no w/c CV: RRR, no BLE edema Psyc: mild anxious and appropriately depressed (less than before - better insight)  Lab Results  Component Value Date   WBC 10.5 08/18/2009   HGB 13.9 08/18/2009   HCT 40.2 08/18/2009   PLT 229 08/18/2009   CHOL 155 12/19/2011   TRIG 55.0 12/19/2011   HDL 72.90 12/19/2011   ALT 28 08/18/2009   AST 34 08/18/2009   NA 143 08/18/2009   K 3.6 08/18/2009   CL 104 08/18/2009   CREATININE 1.25* 08/18/2009   BUN 18 08/18/2009   CO2 31 08/18/2009   TSH 1.62 02/10/2010       Assessment & Plan:  See problem list. Medications and labs reviewed today.  Cough - dry, worse with meals - ongoing >6 mo -  suspect GERD as exam clear - afeb, hx otherwise benign -  Did not yet try otc PPI x 2 weeks for treatment for same, will start now and pt to call if worse

## 2012-06-01 DIAGNOSIS — H43819 Vitreous degeneration, unspecified eye: Secondary | ICD-10-CM | POA: Diagnosis not present

## 2012-06-01 DIAGNOSIS — H2640 Unspecified secondary cataract: Secondary | ICD-10-CM | POA: Diagnosis not present

## 2012-06-01 DIAGNOSIS — H5200 Hypermetropia, unspecified eye: Secondary | ICD-10-CM | POA: Diagnosis not present

## 2012-06-01 DIAGNOSIS — Z961 Presence of intraocular lens: Secondary | ICD-10-CM | POA: Diagnosis not present

## 2012-06-21 ENCOUNTER — Ambulatory Visit (INDEPENDENT_AMBULATORY_CARE_PROVIDER_SITE_OTHER): Payer: Medicare Other | Admitting: Internal Medicine

## 2012-06-21 ENCOUNTER — Encounter: Payer: Self-pay | Admitting: Internal Medicine

## 2012-06-21 VITALS — BP 128/70 | HR 84 | Temp 98.0°F | Resp 16 | Wt 126.0 lb

## 2012-06-21 DIAGNOSIS — T6391XA Toxic effect of contact with unspecified venomous animal, accidental (unintentional), initial encounter: Secondary | ICD-10-CM | POA: Diagnosis not present

## 2012-06-21 DIAGNOSIS — T63461A Toxic effect of venom of wasps, accidental (unintentional), initial encounter: Secondary | ICD-10-CM

## 2012-06-21 DIAGNOSIS — T63441A Toxic effect of venom of bees, accidental (unintentional), initial encounter: Secondary | ICD-10-CM

## 2012-06-21 MED ORDER — METHYLPREDNISOLONE ACETATE 80 MG/ML IJ SUSP
120.0000 mg | Freq: Once | INTRAMUSCULAR | Status: AC
  Administered 2012-06-21: 120 mg via INTRAMUSCULAR

## 2012-06-21 NOTE — Progress Notes (Signed)
  Subjective:    Patient ID: Erika Gibson, female    DOB: 25-Mar-1928, 78 y.o.   MRN: [id number]  Allergic Reaction This is a new problem. The current episode started today. The problem occurs constantly. The problem has been gradually worsening since onset. The problem is mild. The patient was exposed to insect bit. The time of exposure was just prior to onset. The exposure occurred at home. Associated symptoms include itching and a rash. Pertinent negatives include no chest pain, chest pressure, coughing, diarrhea, difficulty breathing, drooling, eye itching, eye redness, eye watering, globus sensation, hyperventilation, stridor, trouble swallowing, vomiting or wheezing. Swelling location: left hand. Past treatments include diphenhydramine. The treatment provided mild relief.      Review of Systems  Constitutional: Negative.   HENT: Negative.  Negative for drooling and trouble swallowing.   Eyes: Negative.  Negative for redness and itching.  Respiratory: Negative.  Negative for cough, wheezing and stridor.   Cardiovascular: Negative.  Negative for chest pain.  Gastrointestinal: Negative.  Negative for vomiting and diarrhea.  Genitourinary: Negative.   Musculoskeletal: Negative.   Skin: Positive for itching and rash. Negative for color change, pallor and wound.  Neurological: Negative.   Hematological: Negative.   Psychiatric/Behavioral: Negative.        Objective:   Physical Exam  Musculoskeletal:       Left hand: She exhibits swelling. She exhibits normal range of motion, no tenderness, no bony tenderness, normal two-point discrimination, normal capillary refill, no deformity and no laceration. Normal strength noted.       Hands:         Assessment & Plan:

## 2012-06-21 NOTE — Patient Instructions (Signed)
Insect Sting Allergy  An insect sting can cause pain, redness, and itching at the sting site. Symptoms of an allergic reaction are usually contained in the area of the sting site (localized). An allergic reaction usually occurs within minutes of an insect sting. Redness and swelling of the sting site may last as long as 1 week.  SYMPTOMS    A local reaction at the sting site can cause:   Pain.   Redness.   Itching.   Swelling.   A systemic reaction can cause a reaction anywhere on your body. For example, you may develop the following:   Hives.   Generalized swelling.   Body aches.   Itching.   Dizziness.   Nausea or vomiting.   A more serious (anaphylactic) reaction can involve:   Difficulty breathing or wheezing.   Tongue or throat swelling.   Fainting.  HOME CARE INSTRUCTIONS    If you are stung, look to see if the stinger is still in the skin. This can appear as a small, black dot at the sting site. The stinger can be removed by scraping it with a dull object such as a credit card or your fingernail. Do not use tweezers. Tweezers can squeeze the stinger and release more insect venom into the skin.   After the stinger has been removed, wash the sting site with soap and water or rubbing alcohol.   Put ice on the sting area.   Put ice in a plastic bag.   Place a towel between your skin and the bag.   Leave the ice on for 15 to 20 minutes, 3 to 4 times a day.   You can use a topical anti-itch cream, such as hydrocortisone cream, to help reduce itching.   You can take an oral antihistamine medicine to help decrease swelling and other symptoms.   Only take over-the-counter or prescription medicines for pain, discomfort, or fever as directed by your caregiver.   If prescribed, keep an epinephrine injection to temporarily treat emergency allergic reactions with you at all times. It is important to know how and when to give an epinephrine injection.   Avoid contact with stinging insects or the  insect thought to have caused your reaction.   Wear long pants when mowing grass or hiking. Wear gloves when gardening.   Use unscented deodorant and avoid strong perfumes when outdoors.   Wear a medical alert bracelet or necklace that describes your allergies.   Make sure your primary caregiver has a record of your insect sting reaction.   It may be helpful to consult with an allergy specialist. You may have other sensitivities that you are not aware of.  SEEK IMMEDIATE MEDICAL CARE IF:   You experience wheezing or difficulty breathing.   You have difficulty swallowing, or you develop throat tightness.   You have mouth, tongue, or throat swelling.   You feel weak, or you faint.   You have coughing or a change in your voice.   You experience vomiting, diarrhea, or stomach cramps.   You have chest pain or lightheadedness.   You notice raised, red patches on the skin that itch.  These may be early warning signs of a serious generalized or anaphylactic reaction. Call your local emergency services (911 in [place] immediately.  MAKE SURE YOU:    Understand these instructions.   Will watch your condition.   Will get help right away if you are not doing well or get worse.  FOR MORE   INFORMATION  American Academy of Allergy Asthma and Immunology: www.aaaai.org  American College of Allergy, Asthma and Immunology: www.acaai.org  Document Released: 10/27/2006 Document Revised: 11/17/2011 Document Reviewed: 12/08/2009  ExitCare Patient Information 2012 [employer].

## 2012-06-21 NOTE — Assessment & Plan Note (Signed)
Will treat with an injection of depo-medrol IM and she will continue taking benadryl

## 2012-06-21 NOTE — Addendum Note (Signed)
Addended by: Elnora Morrison on: 06/21/2012 05:15 PM   Modules accepted: Orders

## 2012-07-05 ENCOUNTER — Ambulatory Visit: Payer: Medicare Other | Admitting: Internal Medicine

## 2012-08-21 ENCOUNTER — Encounter: Payer: Self-pay | Admitting: Internal Medicine

## 2012-08-21 ENCOUNTER — Other Ambulatory Visit (INDEPENDENT_AMBULATORY_CARE_PROVIDER_SITE_OTHER): Payer: Medicare Other

## 2012-08-21 ENCOUNTER — Ambulatory Visit (INDEPENDENT_AMBULATORY_CARE_PROVIDER_SITE_OTHER): Payer: Medicare Other | Admitting: Internal Medicine

## 2012-08-21 VITALS — BP 120/70 | HR 83 | Temp 97.8°F | Ht 64.0 in | Wt 124.4 lb

## 2012-08-21 DIAGNOSIS — R5383 Other fatigue: Secondary | ICD-10-CM | POA: Diagnosis not present

## 2012-08-21 DIAGNOSIS — R209 Unspecified disturbances of skin sensation: Secondary | ICD-10-CM

## 2012-08-21 DIAGNOSIS — I10 Essential (primary) hypertension: Secondary | ICD-10-CM

## 2012-08-21 DIAGNOSIS — F329 Major depressive disorder, single episode, unspecified: Secondary | ICD-10-CM

## 2012-08-21 DIAGNOSIS — R5381 Other malaise: Secondary | ICD-10-CM

## 2012-08-21 DIAGNOSIS — Z23 Encounter for immunization: Secondary | ICD-10-CM | POA: Diagnosis not present

## 2012-08-21 DIAGNOSIS — R200 Anesthesia of skin: Secondary | ICD-10-CM

## 2012-08-21 DIAGNOSIS — F411 Generalized anxiety disorder: Secondary | ICD-10-CM

## 2012-08-21 LAB — HEPATIC FUNCTION PANEL
ALT: 21 U/L (ref 0–35)
AST: 21 U/L (ref 0–37)
Albumin: 4.1 g/dL (ref 3.5–5.2)
Alkaline Phosphatase: 60 U/L (ref 39–117)
Bilirubin, Direct: 0.1 mg/dL (ref 0.0–0.3)
Total Protein: 6.7 g/dL (ref 6.0–8.3)

## 2012-08-21 LAB — TSH: TSH: 2.25 u[IU]/mL (ref 0.35–5.50)

## 2012-08-21 LAB — CBC WITH DIFFERENTIAL/PLATELET
Basophils Relative: 0.9 % (ref 0.0–3.0)
Eosinophils Absolute: 0.2 10*3/uL (ref 0.0–0.7)
Eosinophils Relative: 2.5 % (ref 0.0–5.0)
Hemoglobin: 13.4 g/dL (ref 12.0–15.0)
Lymphocytes Relative: 24.9 % (ref 12.0–46.0)
MCHC: 33 g/dL (ref 30.0–36.0)
Monocytes Relative: 9.9 % (ref 3.0–12.0)
Neutro Abs: 4.7 10*3/uL (ref 1.4–7.7)
RBC: 4.35 Mil/uL (ref 3.87–5.11)
WBC: 7.7 10*3/uL (ref 4.5–10.5)

## 2012-08-21 LAB — BASIC METABOLIC PANEL
BUN: 29 mg/dL — ABNORMAL HIGH (ref 6–23)
Calcium: 9.9 mg/dL (ref 8.4–10.5)
Creatinine, Ser: 1.1 mg/dL (ref 0.4–1.2)
GFR: 51.07 mL/min — ABNORMAL LOW (ref >60.00)

## 2012-08-21 LAB — VITAMIN B12: Vitamin B-12: 917 pg/mL — ABNORMAL HIGH (ref 211–911)

## 2012-08-21 MED ORDER — PAROXETINE HCL 10 MG PO TABS: 10.0000 mg | ORAL_TABLET | ORAL | Status: DC

## 2012-08-21 NOTE — Progress Notes (Signed)
  Subjective:    Patient ID: Erika Gibson, female    DOB: 04/23/1928, 78 y.o.   MRN: [id number]  HPI  here for follow up - reviewed chronic med issues:  Depression/anxiety - largely precipitated by family stressors: Primary caregiver of spouse who has rapidly progressive dementia. Also worries about her son (MI in 2/12) prior trials of generic zoloft, prozac, wellbutrin, and effexor; also tried citalopram late 2011 [facility] - [facility] > caused anxiety > stopped same has tried self-tx with "exercise endorphins"> was feeling better until unable to exercise due to need to stay home to watch spouse for saftey;  symptoms associated with insomnia due to anxiety/panic attacks - previous used prn ambien, low dose amitrip 06/2011 (hangover, constipation), and low dose prn xanax -each initially working well, then stopped due to side effects. No SI/HI Intermittent counseling through the years, largely ineffective per patient Denies homicidal ideation or suicidal plans.  HTN - stopped amlodipine indep early 2012-   on ACEI + diuretic - home log reviewed - SBP 110s-140s, DBP 60-70s -no signs or symptoms of problem such as headache, chest pain or shortness of breath -   dyslipidemia - reports compliance with ongoing medical treatment (statin) and no changes in medication dose or frequency. denies adverse side effects related to current therapy.   Past Medical History  Diagnosis Date  . Colitis, collagenous 2010  . RENAL CALCULUS   . HYPERTENSION   . HYPERLIPIDEMIA   . DEPRESSION   . ASTHMA   . Anxiety state, unspecified   . COLONIC POLYPS, HX OF 06/2002    adenomatous polyp    Review of Systems  Constitutional: Positive for fatigue -no unexpected weight change Respiratory: Negative for shortness of breath or hemoptysis.   Cardiovascular: Negative for chest pain or palpitations.  Gastrointestinal: Negative for abdominal pain.     Objective:   Physical Exam  BP 120/70   Pulse 83  Temp 97.8 F (36.6 C) (Oral)  Ht 5\' 4"  (1.626 m)  Wt 124 lb 6.4 oz (56.427 kg)  BMI 21.35 kg/m2  SpO2 98%  Gen: NAD, nontoxic Lungs: CTA B, no w/c CV: RRR, no BLE edema Psyc: very anxious and very depressed/irritable -occasionally tearful  Lab Results  Component Value Date   WBC 10.5 08/18/2009   HGB 13.9 08/18/2009   HCT 40.2 08/18/2009   PLT 229 08/18/2009   CHOL 155 12/19/2011   TRIG 55.0 12/19/2011   HDL 72.90 12/19/2011   ALT 28 08/18/2009   AST 34 08/18/2009   NA 138 03/06/2012   K 4.2 03/06/2012   CL 102 03/06/2012   CREATININE 1.2 03/06/2012   BUN 28* 03/06/2012   CO2 27 03/06/2012   TSH 1.62 02/10/2010       Assessment & Plan:  See problem list. Medications and labs reviewed today.  Time spent with pt today 30 minutes, greater than 50% time spent counseling patient on anxiety, depression and medication review.

## 2012-08-21 NOTE — Assessment & Plan Note (Signed)
Exac by spouse's rapidly progressing dementia Intol prior trials: zoloft, prozac, wellbutrin, effexor, citalopram and elavil Started 07/2011 on low dose xanax trail to use prn anxiety attacks - refuses same due to side effects Start low dose paxil now Refer again for counseling Support offered - follow up 3-6 months, sooner if problem

## 2012-08-21 NOTE — Patient Instructions (Addendum)
It was good to see you today. we'll make referral to Dr De Hollingshead for counseling . Our office will contact you regarding appointment(s) once made. Start low dose Paxil - Your prescription(s) have been submitted to your pharmacy. Please take as directed and contact our office if you believe you are having problem(s) with the medication(s). Test(s) ordered today. Your results will be called to you after review (48-72hours after test completion). If any changes need to be made, you will be notified at that time. Please schedule followup in 6 weeks, call sooner if problems.

## 2012-08-21 NOTE — Assessment & Plan Note (Signed)
Suspect symptomatic anxiety and depression Check screening labs now

## 2012-08-21 NOTE — Assessment & Plan Note (Signed)
BP Readings from Last 3 Encounters:  08/21/12 120/70  06/21/12 128/70  03/06/12 110/62   The current medical regimen is effective; doing well without amlodipine continue present plan and medications.

## 2012-08-24 ENCOUNTER — Telehealth: Payer: Self-pay | Admitting: *Deleted

## 2012-08-24 NOTE — Progress Notes (Signed)
Pt informed

## 2012-08-24 NOTE — Telephone Encounter (Signed)
Pt states she accidentally took Paxil 10 mg 2 last night. She states she was extremely excited and unable to sleep so she took a sleep aid. She states she feels to be in a fog and only took 1/2 today in hopes to be getting to the right level. Should she continue this dose or is ok to take prescribed dose?

## 2012-08-24 NOTE — Telephone Encounter (Signed)
Take half tablet (5mg ) daily for the next 3 days, then resume whole tablet (10mg ) daily

## 2012-08-24 NOTE — Telephone Encounter (Signed)
Pt informed

## 2012-08-30 ENCOUNTER — Ambulatory Visit: Payer: Medicare Other | Admitting: Psychology

## 2012-09-06 ENCOUNTER — Ambulatory Visit: Payer: Medicare Other | Admitting: Internal Medicine

## 2012-10-02 ENCOUNTER — Ambulatory Visit: Payer: Medicare Other | Admitting: Internal Medicine

## 2012-10-25 DIAGNOSIS — Z1231 Encounter for screening mammogram for malignant neoplasm of breast: Secondary | ICD-10-CM | POA: Diagnosis not present

## 2012-11-02 ENCOUNTER — Other Ambulatory Visit: Payer: Self-pay | Admitting: Internal Medicine

## 2012-11-02 NOTE — Telephone Encounter (Signed)
[facility].../[facility] 

## 2013-01-30 ENCOUNTER — Other Ambulatory Visit: Payer: Self-pay | Admitting: Internal Medicine

## 2013-01-30 NOTE — Telephone Encounter (Signed)
[facility].../[facility] 

## 2013-02-04 DIAGNOSIS — N951 Menopausal and female climacteric states: Secondary | ICD-10-CM | POA: Diagnosis not present

## 2013-02-04 DIAGNOSIS — R5381 Other malaise: Secondary | ICD-10-CM | POA: Diagnosis not present

## 2013-02-04 DIAGNOSIS — I10 Essential (primary) hypertension: Secondary | ICD-10-CM | POA: Diagnosis not present

## 2013-02-04 DIAGNOSIS — E7800 Pure hypercholesterolemia, unspecified: Secondary | ICD-10-CM | POA: Diagnosis not present

## 2013-02-04 DIAGNOSIS — Z124 Encounter for screening for malignant neoplasm of cervix: Secondary | ICD-10-CM | POA: Diagnosis not present

## 2013-06-03 DIAGNOSIS — Z961 Presence of intraocular lens: Secondary | ICD-10-CM | POA: Diagnosis not present

## 2013-06-03 DIAGNOSIS — H2640 Unspecified secondary cataract: Secondary | ICD-10-CM | POA: Diagnosis not present

## 2013-06-03 DIAGNOSIS — H43819 Vitreous degeneration, unspecified eye: Secondary | ICD-10-CM | POA: Diagnosis not present

## 2013-06-17 ENCOUNTER — Other Ambulatory Visit: Payer: Self-pay | Admitting: Internal Medicine

## 2013-07-30 DIAGNOSIS — M899 Disorder of bone, unspecified: Secondary | ICD-10-CM | POA: Diagnosis not present

## 2013-08-19 DIAGNOSIS — L608 Other nail disorders: Secondary | ICD-10-CM | POA: Diagnosis not present

## 2013-09-18 ENCOUNTER — Encounter: Payer: Self-pay | Admitting: Internal Medicine

## 2013-09-20 DIAGNOSIS — Z23 Encounter for immunization: Secondary | ICD-10-CM | POA: Diagnosis not present

## 2013-10-28 DIAGNOSIS — Z1231 Encounter for screening mammogram for malignant neoplasm of breast: Secondary | ICD-10-CM | POA: Diagnosis not present

## 2013-10-29 ENCOUNTER — Encounter: Payer: Self-pay | Admitting: Internal Medicine

## 2013-10-30 ENCOUNTER — Other Ambulatory Visit: Payer: Self-pay | Admitting: Internal Medicine

## 2013-10-31 NOTE — Telephone Encounter (Signed)
rx called into pharmacy

## 2013-10-31 NOTE — Telephone Encounter (Signed)
Ok to phone in.

## 2013-12-23 ENCOUNTER — Other Ambulatory Visit: Payer: Self-pay | Admitting: Internal Medicine

## 2013-12-23 NOTE — Telephone Encounter (Signed)
Last filled 10/30/13 with 1 refill

## 2014-01-29 ENCOUNTER — Encounter: Payer: Self-pay | Admitting: Gastroenterology

## 2014-02-03 ENCOUNTER — Other Ambulatory Visit: Payer: Self-pay | Admitting: Internal Medicine

## 2014-02-05 NOTE — Telephone Encounter (Signed)
Script fax [facility] to [facility].../lmb 

## 2014-02-17 DIAGNOSIS — I10 Essential (primary) hypertension: Secondary | ICD-10-CM | POA: Diagnosis not present

## 2014-02-17 DIAGNOSIS — E7889 Other lipoprotein metabolism disorders: Secondary | ICD-10-CM | POA: Diagnosis not present

## 2014-02-17 DIAGNOSIS — N951 Menopausal and female climacteric states: Secondary | ICD-10-CM | POA: Diagnosis not present

## 2014-02-17 DIAGNOSIS — E7800 Pure hypercholesterolemia, unspecified: Secondary | ICD-10-CM | POA: Diagnosis not present

## 2014-02-17 LAB — BASIC METABOLIC PANEL
BUN: 35 mg/dL — AB (ref 4–21)
CREATININE: 1.2 mg/dL — AB (ref 0.5–1.1)
Glucose: 96 mg/dL
Potassium: 4.2 mmol/L (ref 3.4–5.3)
Sodium: 142 mmol/L (ref 137–147)

## 2014-02-17 LAB — HEPATIC FUNCTION PANEL
ALT: 24 U/L (ref 7–35)
AST: 27 U/L (ref 13–35)
Alkaline Phosphatase: 69 U/L (ref 25–125)
Bilirubin, Total: 0.8 mg/dL

## 2014-02-17 LAB — TSH: TSH: 2.41 u[IU]/mL (ref 0.41–5.90)

## 2014-02-17 LAB — LIPID PANEL
CHOLESTEROL: 166 mg/dL (ref 0–200)
HDL: 73 mg/dL — AB (ref 35–70)
LDL Cholesterol: 72 mg/dL
TRIGLYCERIDES: 106 mg/dL (ref 40–160)

## 2014-02-17 LAB — CBC AND DIFFERENTIAL
HEMATOCRIT: 41 % (ref 36–46)
HEMOGLOBIN: 13.3 g/dL (ref 12.0–16.0)
PLATELETS: 258 10*3/uL (ref 150–399)
WBC: 7 10^3/mL

## 2014-02-20 ENCOUNTER — Telehealth: Payer: Self-pay

## 2014-02-20 NOTE — Telephone Encounter (Signed)
The patient called and scheduled a follow up appointment with Dr.Leschber to discuss meds and lab results.  She is hoping the labs from Dr.Wein ([facility])'s office can be retrieved so Dr.Leschber can see them.  Thanks!

## 2014-02-20 NOTE — Telephone Encounter (Signed)
Will need ROI as Dr Aldona BarWein not in Epic thanks

## 2014-02-20 NOTE — Telephone Encounter (Signed)
Called pt no answer LMOM we haven't received copy of labs she can call them and have them to fax us a copy...Raechel Chute/lmb

## 2014-03-12 ENCOUNTER — Ambulatory Visit (INDEPENDENT_AMBULATORY_CARE_PROVIDER_SITE_OTHER): Payer: Medicare Other | Admitting: Internal Medicine

## 2014-03-12 ENCOUNTER — Encounter: Payer: Self-pay | Admitting: Internal Medicine

## 2014-03-12 ENCOUNTER — Telehealth: Payer: Self-pay | Admitting: Internal Medicine

## 2014-03-12 VITALS — BP 142/70 | HR 78 | Temp 98.1°F | Wt 125.4 lb

## 2014-03-12 DIAGNOSIS — I10 Essential (primary) hypertension: Secondary | ICD-10-CM | POA: Diagnosis not present

## 2014-03-12 DIAGNOSIS — M899 Disorder of bone, unspecified: Secondary | ICD-10-CM

## 2014-03-12 DIAGNOSIS — M76899 Other specified enthesopathies of unspecified lower limb, excluding foot: Secondary | ICD-10-CM | POA: Diagnosis not present

## 2014-03-12 DIAGNOSIS — N189 Chronic kidney disease, unspecified: Secondary | ICD-10-CM | POA: Diagnosis not present

## 2014-03-12 DIAGNOSIS — M8580 Other specified disorders of bone density and structure, unspecified site: Secondary | ICD-10-CM | POA: Insufficient documentation

## 2014-03-12 DIAGNOSIS — M949 Disorder of cartilage, unspecified: Secondary | ICD-10-CM

## 2014-03-12 DIAGNOSIS — Z0000 Encounter for general adult medical examination without abnormal findings: Secondary | ICD-10-CM | POA: Diagnosis not present

## 2014-03-12 DIAGNOSIS — M7072 Other bursitis of hip, left hip: Secondary | ICD-10-CM

## 2014-03-12 NOTE — Progress Notes (Signed)
Pre visit review using our clinic review tool, if applicable. No additional management support is needed unless otherwise documented below in the visit note. 

## 2014-03-12 NOTE — Telephone Encounter (Signed)
Relevant patient education mailed to patient.  

## 2014-03-12 NOTE — Patient Instructions (Addendum)
It was good to see you today.  We have reviewed your prior records including labs and tests today  Test(s) ordered today for week of June 8. Lab only. Your results will be released to MyChart (or called to you) after review, usually within 72hours after test completion. If any changes need to be made, you will be notified at that same time.  Medications reviewed and updated, no changes recommended at this time. Ok for meloxicam in place of ibuprofen for bursitis pain in tailbone  Please schedule followup in 6-12 months, call sooner if problems. Bursitis Bursitis is when the fluid-filled sac (bursa) that covers and protects a joint gets puffy and irritated. The elbow, shoulder, hip, and knee joints are most often affected. HOME CARE  Put ice on the area.  Put ice in a plastic bag.  Place a towel between your skin and the bag.  Leave the ice on for 15-20 minutes, 03-04 times a day.  Put the joint through a full range of motion 4 times a day. Rest the injured joint at other times. When you have less pain, begin slow movements and usual activities.  Only take medicine as told by your doctor.  Follow up with your doctor. Any delay in care could stop the bursitis from healing. This could cause long-term pain. GET HELP RIGHT AWAY IF:   You have more pain with treatment.  You have a temperature by mouth above 102 F (38.9 C), not controlled by medicine.  You have heat and irritation over the fluid-filled sac. MAKE SURE YOU:   Understand these instructions.  Will watch your condition.  Will get help right away if you are not doing well or get worse. Document Released: 05/18/2010 Document Revised: 02/20/2012 Document Reviewed: 05/18/2010 [facility] Patient Information 2014 [place], [place].

## 2014-03-12 NOTE — Assessment & Plan Note (Signed)
[facility] reviewed. On calcium with vitamin D supplementation as per gynecology Interval history reviewed, no changes recommended

## 2014-03-12 NOTE — Assessment & Plan Note (Signed)
BP Readings from Last 3 Encounters:  03/12/14 142/70  08/21/12 120/70  06/21/12 128/70   The current medical regimen is effective; doing well without amlodipine continue present plan and medications: ACEI + maxide Hydration advised - repeat BMet in 05/2014

## 2014-03-12 NOTE — Progress Notes (Signed)
Subjective:    Patient ID: Erika Gibson, female    DOB: 1928/04/10, 80 y.o.   MRN: [id number]  HPI   Here for medicare wellness  Diet: heart healthy  Physical activity: sedentary Depression/mood screen: negative Hearing: intact to whispered voice Visual acuity: grossly normal, performs annual eye exam  ADLs: capable Fall risk: none Home safety: good Cognitive evaluation: intact to orientation, naming, recall and repetition EOL planning: adv directives, full code/ I agree  I have personally reviewed and have noted 1. The patient's medical and social history 2. Their use of alcohol, tobacco or illicit drugs 3. Their current medications and supplements 4. The patient's functional ability including ADL's, fall risks, home safety risks and hearing or visual impairment. 5. Diet and physical activities 6. Evidence for depression or mood disorders  Reviewed chronic medical issues and interval medical events  Past Medical History  Diagnosis Date  . Colitis, collagenous 2010  . RENAL CALCULUS   . HYPERTENSION   . HYPERLIPIDEMIA   . DEPRESSION   . ASTHMA   . Anxiety state, unspecified   . COLONIC POLYPS, HX OF 06/2002    adenomatous polyp   Family History  Problem Relation Age of Onset  . Alzheimer's disease Mother   . Heart disease Father   . Alzheimer's disease Father   . Colon polyps Brother   . Breast cancer Paternal Aunt   . Alzheimer's disease Other    History  Substance Use Topics  . Smoking status: Former Games developer  . Smokeless tobacco: Not on file     Comment: Stopped 45 years ago  . Alcohol Use: No    Review of Systems  Constitutional: Negative for fatigue and unexpected weight change.  Respiratory: Negative for cough, shortness of breath and wheezing.   Cardiovascular: Negative for chest pain, palpitations and leg swelling.  Gastrointestinal: Negative for nausea, abdominal pain and diarrhea.  Musculoskeletal:       Pain with sitting in Left "tailbone",  worse on hard surface or prolonged sitting  Neurological: Negative for dizziness, weakness, light-headedness and headaches.  Psychiatric/Behavioral: Negative for dysphoric mood. The patient is not nervous/anxious.   All other systems reviewed and are negative.       Objective:   Physical Exam  BP 142/70  Pulse 78  Temp(Src) 98.1 F (36.7 C) (Oral)  Wt 125 lb 6.4 oz (56.881 kg)  SpO2 98% Wt Readings from Last 3 Encounters:  03/12/14 125 lb 6.4 oz (56.881 kg)  08/21/12 124 lb 6.4 oz (56.427 kg)  06/21/12 126 lb (57.153 kg)   Constitutional: She appears well-developed and well-nourished. No distress.  Neck: Normal range of motion. Neck supple. No JVD present. No thyromegaly present.  Cardiovascular: Normal rate, regular rhythm and normal heart sounds.  No murmur heard. No BLE edema. Pulmonary/Chest: Effort normal and breath sounds normal. No respiratory distress. She has no wheezes.  MSkel: tender to deep palpation over left ischial tuberosity - FROM B hips and Back:with full range of motion of thoracic and lumbar spine. Non tender to palpation. Negative straight leg raise. DTR's are symmetrically intact. Sensation intact in all dermatomes of the lower extremities. Full strength to manual muscle testing. patient is able to heel toe walk without difficulty and ambulates with antalgic gait. Psychiatric: She has a normal mood and affect. Her behavior is normal. Judgment and thought content normal.   Lab Results  Component Value Date   WBC 7.7 08/21/2012   HGB 13.4 08/21/2012   HCT 40.7 08/21/2012  PLT 225.0 08/21/2012   GLUCOSE 101* 08/21/2012   CHOL 155 12/19/2011   TRIG 55.0 12/19/2011   HDL 72.90 12/19/2011   LDLCALC 71 12/19/2011   ALT 21 08/21/2012   AST 21 08/21/2012   NA 140 08/21/2012   K 4.3 08/21/2012   CL 103 08/21/2012   CREATININE 1.1 08/21/2012   BUN 29* 08/21/2012   CO2 29 08/21/2012   TSH 2.25 08/21/2012    Dg Hip Bilateral W/pelvis  03/11/2011   *RADIOLOGY REPORT*  Clinical  Data: 2-3 months persistent sacroiliac and hip joint pain without trauma.  BILATERAL HIP WITH PELVIS - 4+ VIEW  [facility]  Findings: Stable slight inferior degenerative change left sacroiliac joint seen.  Bilateral sacroiliac and hip joints appear normal for age.  Unchanged facet degenerative joint disease L4-5 and L5-S1.  IMPRESSION:  1.  Stable slight inferior left sacroiliac and bilateral L4-5 and L5-S1 degenerative joint disease. 2.  Otherwise, negative.  Original Report Authenticated By: Barnie DelMINTA E. PHILLIPS, M.D.      Assessment & Plan:   AWV/v70.0 - Today patient counseled on age appropriate routine health concerns for screening and prevention, each reviewed and up to date or declined. Immunizations reviewed and up to date or declined. Labs from gyn reviewed. Risk factors for depression reviewed and negative. Hearing function and visual acuity are intact. ADLs screened and addressed as needed. Functional ability and level of safety reviewed and appropriate. Education, counseling and referrals performed based on assessed risks today. Patient provided with a copy of personalized plan for preventive services.  Mild renal insuff on labs at gyn 02/17/14 reviewed - reassurance provided - discussed potential med changes (discontinue ACE and/or diuretic) this patient tolerating well and blood pressure reasonably controlled, instead encouraged increased hydration at this time. We'll recheck labs in 3 months to ensure no progression. If change or progressive renal disease, we'll then change antihypertensives as discussed  Left ischial tuberosity bursitis -education reassurance provided. Patient to take meloxicam as prescribed by gynecologist. Patient will call if symptoms worse or unimproved  Problem List Items Addressed This Visit   HYPERTENSION      BP Readings from Last 3 Encounters:  03/12/14 142/70  08/21/12 120/70  06/21/12 128/70   The current medical  regimen is effective; doing well without amlodipine continue present plan and medications: ACEI + maxide Hydration advised - repeat BMet in 05/2014    Relevant Orders      Basic metabolic panel   Osteopenia     [facility]. On calcium with vitamin D supplementation as per gynecology Interval history reviewed, no changes recommended     Other Visit Diagnoses   Routine general medical examination at a health care facility    -  Primary    Ischial bursitis of left side

## 2014-04-02 ENCOUNTER — Encounter: Payer: Self-pay | Admitting: Obstetrics & Gynecology

## 2014-04-21 DIAGNOSIS — L608 Other nail disorders: Secondary | ICD-10-CM | POA: Diagnosis not present

## 2014-06-04 DIAGNOSIS — H905 Unspecified sensorineural hearing loss: Secondary | ICD-10-CM | POA: Diagnosis not present

## 2014-06-04 DIAGNOSIS — H9319 Tinnitus, unspecified ear: Secondary | ICD-10-CM | POA: Diagnosis not present

## 2014-06-16 ENCOUNTER — Telehealth: Payer: Self-pay

## 2014-06-16 NOTE — Telephone Encounter (Signed)
PCP received email from the patients son Abe PeopleSteve Atkins over concern for his mom regarding the care of his [facility]  PCP informed to call the son to schedule OV to review.  Did call the son and he did agree to schedule, he will call back.  Stated he did appreciate the call back on response to his email.

## 2014-06-19 ENCOUNTER — Ambulatory Visit (INDEPENDENT_AMBULATORY_CARE_PROVIDER_SITE_OTHER): Payer: Medicare Other | Admitting: Internal Medicine

## 2014-06-19 ENCOUNTER — Encounter: Payer: Self-pay | Admitting: Internal Medicine

## 2014-06-19 VITALS — BP 150/92 | HR 73 | Temp 98.0°F | Wt 122.0 lb

## 2014-06-19 DIAGNOSIS — F32A Depression, unspecified: Secondary | ICD-10-CM

## 2014-06-19 DIAGNOSIS — F329 Major depressive disorder, single episode, unspecified: Secondary | ICD-10-CM | POA: Diagnosis not present

## 2014-06-19 DIAGNOSIS — G479 Sleep disorder, unspecified: Secondary | ICD-10-CM

## 2014-06-19 DIAGNOSIS — F3289 Other specified depressive episodes: Secondary | ICD-10-CM | POA: Diagnosis not present

## 2014-06-19 MED ORDER — CLONAZEPAM 0.5 MG PO TABS: ORAL_TABLET | ORAL | Status: DC

## 2014-06-19 MED ORDER — SERTRALINE HCL 25 MG PO TABS: 25.0000 mg | ORAL_TABLET | Freq: Every day | ORAL | Status: DC
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
## 2014-06-19 NOTE — Progress Notes (Signed)
Pre visit review using our clinic review tool, if applicable. No additional management support is needed unless otherwise documented below in the visit note. 

## 2014-06-19 NOTE — Patient Instructions (Signed)
Please reconsider taking the agent to raise the neurotransmitters which are essential for good brain function, both intellectual & emotional health. These agents are not addictive and simply keep this essential neurotransmitter at therapeutic levels. If these levels become severely depleted; depression or panic attacks can occur.      To prevent sleep dysfunction follow these instructions for sleep hygiene. Do not read, watch TV, or eat in bed. Do not get into bed until you are ready to turn off the light &  to go to sleep. Do not ingest stimulants ( decongestants, diet pills, nicotine, caffeine) after the evening meal.Do not take daytime naps.Cardiovascular exercise, this can be as simple a program as walking, is recommended 30-45 minutes 3-4 times per week. If you're not exercising you should take 6-8 weeks to build up to this level.

## 2014-06-19 NOTE — Progress Notes (Signed)
   Subjective:    Patient ID: Erika Gibson, female    DOB: 14-Apr-1928, 80 y.o.   MRN: [id number]  HPI Pt presents today with anxiety and insomnia. The pt's symptoms are in the context of a chronically ill husband who has Parkinson's and dementia. Pt is in the midst of figuring out what to do with her husband when he is discharged from rehab, following a 2 week hospitalization in May 2015. In addition, her adult son recently lost his job and had to sell his house and is currently living with her in her home.   The pt's anxiety is intermittent throughout the day but has progressively worsened since her husband's hospitlazation. She states she has episodes every couple of days where she feels so stressed and anxious that she can't think straight. These episodes are immobilizing. The pt denies any chest pain, tightness, SOB or diaphoresis.   The pt's insomnia is due to the fact that prior to her husband's hospitalization she was getting up with him throughout the night to help with toileting. She is unable to sleep through the night and is averaging 4 hours of sleep per night. She typically does not have trouble falling asleep. She was given [place] , but states she never took the full 5mg  because she was afraid she would not be able to hear her husband getting up at night.  Pt was seen in 2013 by Dr. Lennox GrumblesLeschberg and was started on paxil. She has tried various anti-depressants and anxiolytics, reporting intolerable side effects. She has also stated that counseling has been ineffective in the past.    Review of Systems     Objective:   Physical Exam        Assessment & Plan:

## 2014-07-29 ENCOUNTER — Encounter: Payer: Self-pay | Admitting: Gastroenterology

## 2014-08-07 ENCOUNTER — Encounter: Payer: Self-pay | Admitting: Internal Medicine

## 2014-08-07 ENCOUNTER — Ambulatory Visit (INDEPENDENT_AMBULATORY_CARE_PROVIDER_SITE_OTHER): Payer: Medicare Other | Admitting: Internal Medicine

## 2014-08-07 VITALS — BP 132/78 | HR 79 | Temp 97.8°F | Ht 64.0 in | Wt 121.5 lb

## 2014-08-07 DIAGNOSIS — F3289 Other specified depressive episodes: Secondary | ICD-10-CM

## 2014-08-07 DIAGNOSIS — F4321 Adjustment disorder with depressed mood: Secondary | ICD-10-CM | POA: Diagnosis not present

## 2014-08-07 DIAGNOSIS — F329 Major depressive disorder, single episode, unspecified: Secondary | ICD-10-CM | POA: Diagnosis not present

## 2014-08-07 NOTE — Progress Notes (Signed)
Subjective:    Patient ID: Erika Gibson, female    DOB: 03/19/1928, 80 y.o.   MRN: [id number]  HPI  Patient is here for follow up depression and stress - OV 06/17/14 with [facility] for same Reviewed chronic medical issues and interval medical events - spouse passed 07/21/14 @ SNF  Past Medical History  Diagnosis Date  . Colitis, collagenous 2010  . RENAL CALCULUS   . HYPERTENSION   . HYPERLIPIDEMIA   . DEPRESSION   . ASTHMA   . Anxiety state, unspecified   . COLONIC POLYPS, HX OF 06/2002    adenomatous polyp  . Osteopenia     Review of Systems  Constitutional: Positive for fatigue. Negative for fever and unexpected weight change.  Cardiovascular: Negative for chest pain and leg swelling.  Neurological: Negative for speech difficulty and headaches.  Psychiatric/Behavioral: Positive for sleep disturbance, dysphoric mood and decreased concentration. Negative for suicidal ideas, hallucinations and self-injury. The patient is nervous/anxious.        Objective:   Physical Exam  BP 132/78  Pulse 79  Temp(Src) 97.8 F (36.6 C) (Oral)  Ht  (1.626 m)  Wt 121 lb 8 oz (55.112 kg)  BMI 20.85 kg/m2  SpO2 97% Wt Readings from Last 3 Encounters:  08/07/14 121 lb 8 oz (55.112 kg)  06/19/14 122 lb (55.339 kg)  03/12/14 125 lb 6.4 oz (56.881 kg)   Constitutional: She is emotionally distressed -but appears well-developed and well-nourished. No distress.  Neck: Normal range of motion. Neck supple. No JVD present. No thyromegaly present.  Cardiovascular: Normal rate, regular rhythm and normal heart sounds.  No murmur heard. No BLE edema. Pulmonary/Chest: Effort normal and breath sounds normal. No respiratory distress. She has no wheezes.  Psychiatric: She has a tearful and appropriately grieving mood and affect. Her behavior is normal. Judgment and thought content normal.   Lab Results  Component Value Date   WBC 7.0 02/17/2014   HGB 13.3 02/17/2014   HCT 41 02/17/2014   PLT 258 02/17/2014     GLUCOSE 101* 08/21/2012   CHOL 166 02/17/2014   TRIG 106 02/17/2014   HDL 73* 02/17/2014   LDLCALC 72 02/17/2014   ALT 24 02/17/2014   AST 27 02/17/2014   NA 142 02/17/2014   K 4.2 02/17/2014   CL 103 08/21/2012   CREATININE 1.2* 02/17/2014   BUN 35* 02/17/2014   CO2 29 08/21/2012   TSH 2.41 02/17/2014    Dg Hip Bilateral W/pelvis  03/11/2011   *RADIOLOGY REPORT*  Clinical Data: 2-3 months persistent sacroiliac and hip joint pain without trauma.  BILATERAL HIP WITH PELVIS - 4+ VIEW  Comparison: [facility] pelvic CT 08/18/2009.  Findings: Stable slight inferior degenerative change left sacroiliac joint seen.  Bilateral sacroiliac and hip joints appear normal for age.  Unchanged facet degenerative joint disease L4-5 and L5-S1.  IMPRESSION:  1.  Stable slight inferior left sacroiliac and bilateral L4-5 and L5-S1 degenerative joint disease. 2.  Otherwise, negative.  Original Report Authenticated By: Barnie Del, M.D.      Assessment & Plan:   Brief reaction -spouse passed August 10. Depression. Historically intolerant of medications due to side effects, not taking sertraline or clonazepam because of same. Currently exacerbated by grief  Refer for bereavement counseling, question hospice eligible via spouse enrollment prior to death?  Time spent with pttoday 30 minutes, greater than 50% time spent counseling patient on grief reaction, chronic depression and medication review. Also review of  prior records

## 2014-08-07 NOTE — Progress Notes (Signed)
Pre visit review using our clinic review tool, if applicable. No additional management support is needed unless otherwise documented below in the visit note. 

## 2014-08-07 NOTE — Patient Instructions (Signed)
It was good to see you today.  I am sorry for your loss.  Medications reviewed and updated, no changes recommended at this time.  we'll make referral for grief counseling (Bereavement counseling). Our office will contact you regarding appointment(s) once made.  Please schedule followup in 4-6 weeks for recheck, call sooner if problems.

## 2014-09-11 ENCOUNTER — Encounter: Payer: Self-pay | Admitting: Internal Medicine

## 2014-09-11 ENCOUNTER — Ambulatory Visit (INDEPENDENT_AMBULATORY_CARE_PROVIDER_SITE_OTHER): Payer: Medicare Other | Admitting: Internal Medicine

## 2014-09-11 ENCOUNTER — Other Ambulatory Visit (INDEPENDENT_AMBULATORY_CARE_PROVIDER_SITE_OTHER): Payer: Medicare Other

## 2014-09-11 VITALS — BP 138/78 | HR 69 | Temp 98.2°F | Ht 64.0 in | Wt 123.2 lb

## 2014-09-11 DIAGNOSIS — Z23 Encounter for immunization: Secondary | ICD-10-CM

## 2014-09-11 DIAGNOSIS — N182 Chronic kidney disease, stage 2 (mild): Secondary | ICD-10-CM | POA: Diagnosis not present

## 2014-09-11 DIAGNOSIS — F411 Generalized anxiety disorder: Secondary | ICD-10-CM | POA: Diagnosis not present

## 2014-09-11 DIAGNOSIS — I10 Essential (primary) hypertension: Secondary | ICD-10-CM | POA: Diagnosis not present

## 2014-09-11 LAB — BASIC METABOLIC PANEL
BUN: 23 mg/dL (ref 6–23)
CALCIUM: 9.9 mg/dL (ref 8.4–10.5)
CO2: 31 meq/L (ref 19–32)
CREATININE: 1 mg/dL (ref 0.4–1.2)
Chloride: 102 mEq/L (ref 96–112)
GFR: 53.64 mL/min — AB (ref >60.00)
GLUCOSE: 89 mg/dL (ref 70–99)
Potassium: 4.2 mEq/L (ref 3.5–5.1)
Sodium: 138 mEq/L (ref 135–145)

## 2014-09-11 MED ORDER — ALPRAZOLAM 0.25 MG PO TABS: 0.2500 mg | ORAL_TABLET | Freq: Two times a day (BID) | ORAL | Status: DC | PRN

## 2014-09-11 NOTE — Assessment & Plan Note (Signed)
BP Readings from Last 3 Encounters:  09/11/14 138/78  08/07/14 132/78  06/19/14 150/92   The current medical regimen is effective; doing well without amlodipine continue present plan and medications: ACEI + maxide repeat BMet today

## 2014-09-11 NOTE — Assessment & Plan Note (Signed)
Baseline Cr 1.6 on labs from gyn office - reviewed today Improved off ACEI and maxide but then resumed same for BP BP controlled today- will monitor same Reassurance provided, no specific intervention Recheck labs today

## 2014-09-11 NOTE — Progress Notes (Signed)
Pre visit review using our clinic review tool, if applicable. No additional management support is needed unless otherwise documented below in the visit note. 

## 2014-09-11 NOTE — Progress Notes (Signed)
Subjective:    Patient ID: Hiram GashBetty J Steuart, female    DOB: 1928-10-21, 80 y.o.   MRN: [id number]  HPI  Patient is here for follow up  Reviewed chronic medical issues and interval medical events  Past Medical History  Diagnosis Date  . Colitis, collagenous 2010  . RENAL CALCULUS   . HYPERTENSION   . HYPERLIPIDEMIA   . DEPRESSION   . ASTHMA   . Anxiety state, unspecified   . COLONIC POLYPS, HX OF 06/2002    adenomatous polyp  . Osteopenia     Review of Systems  Constitutional: Positive for fatigue. Negative for fever.  Cardiovascular: Negative for chest pain and leg swelling.  Psychiatric/Behavioral: Negative for suicidal ideas and self-injury. The patient is not nervous/anxious.        Objective:   Physical Exam  BP 138/78  Pulse 69  Temp(Src) 98.2 F (36.8 C) (Oral)  Ht 5\' 4"  (1.626 m)  Wt 123 lb 4 oz (55.906 kg)  BMI 21.15 kg/m2  SpO2 95% Wt Readings from Last 3 Encounters:  09/11/14 123 lb 4 oz (55.906 kg)  08/07/14 121 lb 8 oz (55.112 kg)  06/19/14 122 lb (55.339 kg)    Constitutional: She appears well-developed and well-nourished. No distress.  Neck: Normal range of motion. Neck supple. No JVD present. No thyromegaly present.  Cardiovascular: Normal rate, regular rhythm and normal heart sounds.  No murmur heard. No BLE edema. Pulmonary/Chest: Effort normal and breath sounds normal. No respiratory distress. She has no wheezes.  Psychiatric: She has a normal mood and affect. Her behavior is normal. Judgment and thought content normal.   Lab Results  Component Value Date   WBC 7.0 02/17/2014   HGB 13.3 02/17/2014   HCT 41 02/17/2014   PLT 258 02/17/2014   GLUCOSE 101* 08/21/2012   CHOL 166 02/17/2014   TRIG 106 02/17/2014   HDL 73* 02/17/2014   LDLCALC 72 02/17/2014   ALT 24 02/17/2014   AST 27 02/17/2014   NA 142 02/17/2014   K 4.2 02/17/2014   CL 103 08/21/2012   CREATININE 1.2* 02/17/2014   BUN 35* 02/17/2014   CO2 29 08/21/2012   TSH 2.41 02/17/2014    Dg Hip Bilateral  W/pelvis  03/11/2011   *RADIOLOGY REPORT*  Clinical Data: 2-3 months persistent sacroiliac and hip joint pain without trauma.  BILATERAL HIP WITH PELVIS - 4+ VIEW  Comparison[facility] 08/18/2009.  Findings: Stable slight inferior degenerative change left sacroiliac joint seen.  Bilateral sacroiliac and hip joints appear normal for age.  Unchanged facet degenerative joint disease L4-5 and L5-S1.  IMPRESSION:  1.  Stable slight inferior left sacroiliac and bilateral L4-5 and L5-S1 degenerative joint disease. 2.  Otherwise, negative.  Original Report Authenticated By: Barnie DelMINTA E. PHILLIPS, M.D.      Assessment & Plan:   Problem List Items Addressed This Visit   Anxiety state - Primary     Exac by death of spouse 07/2014 (prev spouse with rapidly progressing dementia) and alcoholic son who has been living with pt since 07/2014 Intol prior trials: zoloft, prozac, wellbutrin, effexor, citalopram, paxil and elavil Started 07/2011 on low dose xanax trial to use prn anxiety attacks - taken prn through 08/2012 intermittently but no refills since Refill rx now Attending hospice counseling as needed Support offered - follow up 3-6 months, sooner if problem    Relevant Medications      ALPRAZolam  Prudy Feeler(XANAX) tablet   CKD (chronic kidney  disease)     Baseline Cr 1.6 on labs from gyn office - reviewed today Improved off ACEI and maxide but then resumed same for BP BP controlled today- will monitor same Reassurance provided, no specific intervention Recheck labs today    Relevant Orders      Basic metabolic panel   Essential hypertension      BP Readings from Last 3 Encounters:  09/11/14 138/78  08/07/14 132/78  06/19/14 150/92   The current medical regimen is effective; doing well without amlodipine continue present plan and medications: ACEI + maxide repeat BMet today    Relevant Orders      Basic metabolic panel    Other Visit Diagnoses   Need for prophylactic vaccination  and inoculation against influenza        Relevant Orders       Flu vaccine HIGH DOSE PF (Fluzone Tri High dose) (Completed)

## 2014-09-11 NOTE — Assessment & Plan Note (Signed)
Exac by death of spouse 07/2014 (prev spouse with rapidly progressing dementia) and alcoholic son who has been living with pt since 07/2014 Intol prior trials: zoloft, prozac, wellbutrin, effexor, citalopram, paxil and elavil Started 07/2011 on low dose xanax trial to use prn anxiety attacks - taken prn through 08/2012 intermittently but no refills since Refill rx now Attending hospice counseling as needed Support offered - follow up 3-6 months, sooner if problem

## 2014-09-11 NOTE — Patient Instructions (Addendum)
It was good to see you today.  Your annual flu shot was given and/or updated today.  We have reviewed your prior records including labs and tests today  Test(s) ordered today. Your results will be released to MyChart (or called to you) after review, usually within 72hours after test completion. If any changes need to be made, you will be notified at that same time.  Medications reviewed and updated Low dose generic xanax as needed for anxiety - no other changes recommended at this time. Your prescription(s) have been submitted to your pharmacy. Please take as directed and contact our office if you believe you are having problem(s) with the medication(s).  Please schedule followup in 3-4 months, call sooner if problems.

## 2014-09-17 ENCOUNTER — Telehealth: Payer: Self-pay | Admitting: *Deleted

## 2014-09-17 NOTE — Telephone Encounter (Signed)
Left msg on triage requesting lab results. Called pt inform her md has mailed out lab letter, but did relay md response...Raechel Chute/lmb

## 2014-10-08 ENCOUNTER — Other Ambulatory Visit: Payer: Self-pay | Admitting: Internal Medicine

## 2014-10-23 ENCOUNTER — Encounter: Payer: Self-pay | Admitting: Family

## 2014-10-23 ENCOUNTER — Ambulatory Visit (INDEPENDENT_AMBULATORY_CARE_PROVIDER_SITE_OTHER): Payer: Medicare Other | Admitting: Family

## 2014-10-23 VITALS — BP 134/70 | HR 86 | Temp 98.2°F | Resp 18 | Ht 64.0 in | Wt 121.0 lb

## 2014-10-23 DIAGNOSIS — F411 Generalized anxiety disorder: Secondary | ICD-10-CM

## 2014-10-23 NOTE — Progress Notes (Signed)
   Subjective:    Patient ID: Erika Gibson, female    DOB: Aug 10, 1928, 80 y.o.   MRN: [id number]  Chief Complaint  Patient presents with  . Insomnia    not able to sleep well at night    HPI:  Erika Gibson is a 80 y.o. female who presents today for follow-up depression and insomnia.   Describes being under a significant amount of stress and anxiety lately with the death of her husband about 2 months ago and her son being treated in the hospital for substance abuse. Indicates that it is difficult for her to fall asleep and stay asleep because she is now in a house by herself following a 60+ year marriage. She has been tried on several anxiety pills and sleep aids including Ambien, which she is no longer taking. Currently she is managing with 0.25 mg of Xanax to help her sleep however indicates that she is not able to stay asleep. Denies any ideations of harming herself.   Allergies  Allergen Reactions  . Mesalamine     REACTION: fuzzy headed and unsteady   Current Outpatient Prescriptions on File Prior to Visit  Medication Sig Dispense Refill  . ALPRAZolam (XANAX) 0.25 MG tablet TAKE 1 TABLET TWICE A DAY AS NEEDED FOR ANXIETY 40 tablet 0  . atorvastatin (LIPITOR) 10 MG tablet Take 10 mg by mouth daily.      . Calcium Citrate-Vitamin D (CITRACAL/VITAMIN D PO) Take by mouth daily.    Marland Kitchen. lisinopril (PRINIVIL,ZESTRIL) 10 MG tablet Take 10 mg by mouth daily.      . Multiple Vitamin (MULTIVITAMIN) tablet Take 1 tablet by mouth daily.      . Probiotic Product (PHILLIPS COLON HEALTH) CAPS Take by mouth daily.      Marland Kitchen. triamterene-hydrochlorothiazide (MAXZIDE-25) 37.5-25 MG per tablet Take 1 tablet by mouth daily.       No current facility-administered medications on file prior to visit.   Past Medical History  Diagnosis Date  . Colitis, collagenous 2010  . RENAL CALCULUS   . HYPERTENSION   . HYPERLIPIDEMIA   . DEPRESSION   . ASTHMA   . Anxiety state, unspecified   . COLONIC POLYPS, HX OF  06/2002    adenomatous polyp  . Osteopenia     Review of Systems    See HPI Objective:    BP 134/70 mmHg  Pulse 86  Temp(Src) 98.2 F (36.8 C) (Oral)  Resp 18  Ht 5\' 4"  (1.626 m)  Wt 121 lb (54.885 kg)  BMI 20.76 kg/m2  SpO2 96% Nursing note and vital signs reviewed.  Physical Exam  Constitutional: She is oriented to person, place, and time. She appears well-developed and well-nourished. No distress.  Appears slightly down  Cardiovascular: Normal rate, regular rhythm and normal heart sounds.   Pulmonary/Chest: Effort normal and breath sounds normal.  Neurological: She is alert and oriented to person, place, and time.  Skin: Skin is warm and dry.  Psychiatric: She has a normal mood and affect. Her behavior is normal. Judgment and thought content normal.       Assessment & Plan:

## 2014-10-23 NOTE — Assessment & Plan Note (Signed)
Anxiety most likely cause of insomnia secondary to life changes with passing of husband and concerns about her son. Discussed various methods of sleep hygiene. Start melatonin as needed. Continue Xanax. Start 0.5 mg Xanax in attempts to increase sleep. May either 0.5 mg at bedtime or 0.25 mg at bedtime and then 0.25 mg as needed after waking. Follow up in about 2 weeks to determine effectiveness.

## 2014-10-23 NOTE — Patient Instructions (Signed)
Thank you for choosing [facility].  Summary/Instructions:   Please try to take 2 of the Xanax either together at bed time or 1 at bed time or 1 in the evening.   Please try over the counter melatonin as written on the bottle.

## 2014-10-23 NOTE — Progress Notes (Signed)
Pre visit review using our clinic review tool, if applicable. No additional management support is needed unless otherwise documented below in the visit note. 

## 2014-10-27 ENCOUNTER — Ambulatory Visit: Payer: Medicare Other | Admitting: Family

## 2014-10-30 ENCOUNTER — Ambulatory Visit: Payer: Medicare Other | Admitting: Family

## 2014-11-04 ENCOUNTER — Encounter: Payer: Self-pay | Admitting: Family

## 2014-11-04 ENCOUNTER — Ambulatory Visit (INDEPENDENT_AMBULATORY_CARE_PROVIDER_SITE_OTHER): Payer: Medicare Other | Admitting: Family

## 2014-11-04 VITALS — BP 138/80 | HR 77 | Temp 98.4°F | Resp 18 | Ht 64.0 in | Wt 121.2 lb

## 2014-11-04 DIAGNOSIS — F411 Generalized anxiety disorder: Secondary | ICD-10-CM | POA: Diagnosis not present

## 2014-11-04 MED ORDER — ALPRAZOLAM 0.25 MG PO TABS: ORAL_TABLET | ORAL | Status: DC

## 2014-11-04 NOTE — Patient Instructions (Addendum)
Thank you for choosing [facility].  Summary/Instructions:  If your symptoms worsen or fail to improve, please contact our office for further instruction, or in case of emergency go directly to the emergency room at the closest medical facility.   Continue to take the xanax at bedtime. Please take the xanax as needed during the day or when having increased anxiety. Follow up in about a month.

## 2014-11-04 NOTE — Assessment & Plan Note (Addendum)
Continues to have anxiety related to her son which may be contributing to depression. Discussed adding a daily medication, but would like to try alternative Xanax use first. Continue 0.25 mg of Xanax as needed for anxiety and sleep. May take up to 0.5 mg at night to assist with sleep. Take 0.25 mg no more than 2 daily PRN. Follow up in about 1 month to determine effectiveness. Support given to patient.

## 2014-11-04 NOTE — Progress Notes (Signed)
Pre visit review using our clinic review tool, if applicable. No additional management support is needed unless otherwise documented below in the visit note. 

## 2014-11-04 NOTE — Progress Notes (Signed)
   Subjective:    Patient ID: Erika GashBetty J Kregel, female    DOB: 09/20/1928, 80 y.o.   MRN: [id number]  Chief Complaint  Patient presents with  . Follow-up    anxiety hasn't really eased up any, not able to sleep well still    HPI:  Erika GashBetty J Skarzynski is a 80 y.o. female who presents today for follow up. She continues to have anxiety related to her son and loss of her husband. Indicates that she is able to go to sleep at night however wakes up several hours after taking the Xanax. Also has noticed increased anxiety when working with her son who is continuing to have alcohol abuse issues. Upon leaving the hospital he restarted drinking and is now looking into a rehabilitation facility. Pt believes that once he is taken care of, a lot of her stress and anxiety will be decreased. Denies any current suicidal ideations.   Allergies  Allergen Reactions  . Mesalamine     REACTION: fuzzy headed and unsteady   Current Outpatient Prescriptions on File Prior to Visit  Medication Sig Dispense Refill  . atorvastatin (LIPITOR) 10 MG tablet Take 10 mg by mouth daily.      . Calcium Citrate-Vitamin D (CITRACAL/VITAMIN D PO) Take by mouth daily.    Marland Kitchen. lisinopril (PRINIVIL,ZESTRIL) 10 MG tablet Take 10 mg by mouth daily.      . Multiple Vitamin (MULTIVITAMIN) tablet Take 1 tablet by mouth daily.      . Probiotic Product (PHILLIPS COLON HEALTH) CAPS Take by mouth daily.      Marland Kitchen. triamterene-hydrochlorothiazide (MAXZIDE-25) 37.5-25 MG per tablet Take 1 tablet by mouth daily.      Marland Kitchen. zolpidem (AMBIEN) 5 MG tablet Take 5 mg by mouth at bedtime as needed for sleep.     No current facility-administered medications on file prior to visit.   Review of Systems     See HPI  Objective:    BP 138/80 mmHg  Pulse 77  Temp(Src) 98.4 F (36.9 C) (Oral)  Resp 18  Ht 5\' 4"  (1.626 m)  Wt 121 lb 3.2 oz (54.976 kg)  BMI 20.79 kg/m2  SpO2 97% Nursing note and vital signs reviewed.  Physical Exam  Constitutional: She is  oriented to person, place, and time. She appears well-developed and well-nourished. No distress.  Appears down, dressed appropriately, appears her stated age.   Cardiovascular: Normal rate, regular rhythm and normal heart sounds.   Pulmonary/Chest: Effort normal and breath sounds normal.  Neurological: She is alert and oriented to person, place, and time.  Skin: Skin is warm and dry.  Psychiatric: Her speech is normal. Judgment and thought content normal. She is withdrawn. Cognition and memory are normal. She exhibits a depressed mood. She expresses no suicidal ideation.       Assessment & Plan:

## 2014-11-17 DIAGNOSIS — Z1231 Encounter for screening mammogram for malignant neoplasm of breast: Secondary | ICD-10-CM | POA: Diagnosis not present

## 2014-11-20 ENCOUNTER — Ambulatory Visit (INDEPENDENT_AMBULATORY_CARE_PROVIDER_SITE_OTHER): Payer: Medicare Other | Admitting: Family

## 2014-11-20 ENCOUNTER — Encounter: Payer: Self-pay | Admitting: Family

## 2014-11-20 VITALS — BP 120/62 | HR 77 | Temp 98.1°F | Resp 18 | Ht 64.0 in | Wt 119.8 lb

## 2014-11-20 DIAGNOSIS — F411 Generalized anxiety disorder: Secondary | ICD-10-CM

## 2014-11-20 DIAGNOSIS — G4700 Insomnia, unspecified: Secondary | ICD-10-CM | POA: Diagnosis not present

## 2014-11-20 NOTE — Patient Instructions (Signed)
Thank you for choosing [facility].  Summary/Instructions:  Continue to take your medication as you feel you need.   You may take 2 tablets (0.5 mg ) at night to help you sleep.

## 2014-11-20 NOTE — Assessment & Plan Note (Signed)
Discussed the risks and benefits of medication use and safety. She will continue the prescribed mediations as discussed. Continue Xanax or Ambian as needed for sleep. Instructed there is no need to use it everyday if not needed.

## 2014-11-20 NOTE — Progress Notes (Signed)
   Subjective:    Patient ID: Erika Gibson, female    DOB: [birth year], 80 y.o.   MRN: [id number]  Chief Complaint  Patient presents with  . Follow-up    stopped taking the xanax and ambien, scared about the addiction part, still not able to sleep well at night    HPI:  Erika Gibson is a 80 y.o. female who presents today for anxiety and depression follow up.   Has been taking Xanax to help her sleep and expresses concern about the additive properties of both the Ambian and the Xanax. Still continues to struggle getting a full nights sleep. Is taking a half tablet of Ambian. Her [place] recently [place] and she has intends to visit her other son over the course of the holiday season.  Review of Systems    See HPI  Objective:    BP 120/62 mmHg  Pulse 77  Temp(Src) 98.1 F (36.7 C) (Oral)  Resp 18  Ht 5\' 4"  (1.626 m)  Wt 119 lb 12.8 oz (54.341 kg)  BMI 20.55 kg/m2  SpO2 98% Nursing note and vital signs reviewed.  Physical Exam  Constitutional: She is oriented to person, place, and time. She appears well-developed and well-nourished. No distress.  Cardiovascular: Normal rate, regular rhythm, normal heart sounds and intact distal pulses.   Pulmonary/Chest: Effort normal and breath sounds normal.  Neurological: She is alert and oriented to person, place, and time.  Skin: Skin is warm and dry.  Psychiatric: She has a normal mood and affect. Her behavior is normal. Judgment and thought content normal.       Assessment & Plan:

## 2014-11-20 NOTE — Assessment & Plan Note (Signed)
Continues to have anxiety despite her son being admitted to rehabilitation. This stressor removal does improve her mood slightly. Discussed rekindling the things she previously had enjoyed doing and reaching out to community activities. She is planning a trip to visit her older son for the holidays which should help with her mood.

## 2014-11-20 NOTE — Progress Notes (Signed)
Pre visit review using our clinic review tool, if applicable. No additional management support is needed unless otherwise documented below in the visit note. 

## 2014-11-21 ENCOUNTER — Ambulatory Visit: Payer: Medicare Other | Admitting: Family

## 2014-12-09 DIAGNOSIS — H26493 Other secondary cataract, bilateral: Secondary | ICD-10-CM | POA: Diagnosis not present

## 2014-12-09 DIAGNOSIS — H43813 Vitreous degeneration, bilateral: Secondary | ICD-10-CM | POA: Diagnosis not present

## 2014-12-09 DIAGNOSIS — H52203 Unspecified astigmatism, bilateral: Secondary | ICD-10-CM | POA: Diagnosis not present

## 2014-12-09 DIAGNOSIS — Z961 Presence of intraocular lens: Secondary | ICD-10-CM | POA: Diagnosis not present

## 2014-12-22 ENCOUNTER — Ambulatory Visit (INDEPENDENT_AMBULATORY_CARE_PROVIDER_SITE_OTHER): Payer: Medicare Other | Admitting: Internal Medicine

## 2014-12-22 ENCOUNTER — Encounter: Payer: Self-pay | Admitting: Internal Medicine

## 2014-12-22 ENCOUNTER — Ambulatory Visit: Payer: Medicare Other | Admitting: Internal Medicine

## 2014-12-22 VITALS — BP 132/78 | HR 67 | Temp 97.5°F | Ht 64.0 in | Wt 124.5 lb

## 2014-12-22 DIAGNOSIS — E785 Hyperlipidemia, unspecified: Secondary | ICD-10-CM | POA: Diagnosis not present

## 2014-12-22 DIAGNOSIS — Z23 Encounter for immunization: Secondary | ICD-10-CM

## 2014-12-22 DIAGNOSIS — I10 Essential (primary) hypertension: Secondary | ICD-10-CM

## 2014-12-22 DIAGNOSIS — F411 Generalized anxiety disorder: Secondary | ICD-10-CM | POA: Diagnosis not present

## 2014-12-22 DIAGNOSIS — M8580 Other specified disorders of bone density and structure, unspecified site: Secondary | ICD-10-CM

## 2014-12-22 NOTE — Assessment & Plan Note (Signed)
Interval hx reviewed -currently stable without meds Intermittent chronic symptoms exac by death of spouse 07/2014 (prev spouse with rapidly progressing dementia) and alcoholic son who has been living with pt since 07/2014 -her was in [place]hab in [place] 1 mo, back home 12/12/14 Intol prior trials: zoloft, prozac, wellbutrin, effexor, citalopram, paxil and elavil Started 07/2011 on low dose xanax trial to use prn anxiety attacks - taken prn through 08/2012 intermittently  Attending counseling as needed -hospice or [facility] as needed Support offered - follow up 3-6 months, sooner if problem

## 2014-12-22 NOTE — Patient Instructions (Signed)
It was good to see you today.  We have reviewed your prior records including labs and tests today  Test(s) ordered today. Return when you are fasting. Your results will be released to MyChart (or called to you) after review, usually within 72hours after test completion. If any changes need to be made, you will be notified at that same time.  Medications reviewed and updated, no changes recommended at this time. Refill on medication(s) as discussed today.  we'll make referral to [facility] for bone density when due summer 2016  . Our office will contact you regarding appointment(s) once made.  Please schedule followup in 6 months for annual exam, call sooner if problems.

## 2014-12-22 NOTE — Assessment & Plan Note (Signed)
BP Readings from Last 3 Encounters:  12/22/14 132/78  11/20/14 120/62  11/04/14 138/80   The current medical regimen is effective; doing well without amlodipine continue present plan and medications: ACEI + maxide

## 2014-12-22 NOTE — Progress Notes (Signed)
Pre visit review using our clinic review tool, if applicable. No additional management support is needed unless otherwise documented below in the visit note. 

## 2014-12-22 NOTE — Assessment & Plan Note (Signed)
[facility] summer [facility] reviewed. Schedule summer 2016 when due No longer on calcium with vitamin D supplementation as per gynecology Interval history reviewed, no changes recommended

## 2014-12-22 NOTE — Progress Notes (Signed)
Subjective:    Patient ID: Hiram GashBetty J Mcintyre, female    DOB: [birth year], 81 y.o.   MRN: [id number]  HPI  Patient is here for follow up  Reviewed chronic medical issues and interval medical events  Past Medical History  Diagnosis Date  . Colitis, collagenous 2010  . RENAL CALCULUS   . HYPERTENSION   . HYPERLIPIDEMIA   . DEPRESSION   . ASTHMA   . Anxiety state, unspecified   . COLONIC POLYPS, HX OF 06/2002    adenomatous polyp  . Osteopenia     Review of Systems  Constitutional: Negative for fever, fatigue and unexpected weight change.  Respiratory: Negative for cough and shortness of breath.   Cardiovascular: Negative for chest pain and leg swelling.  Psychiatric/Behavioral: Negative for sleep disturbance, dysphoric mood and decreased concentration. The patient is not nervous/anxious.        Objective:   Physical Exam  BP 132/78 mmHg  Pulse 67  Temp(Src) 97.5 F (36.4 C) (Oral)  Ht 5\' 4"  (1.626 m)  Wt 124 lb 8 oz (56.473 kg)  BMI 21.36 kg/m2  SpO2 98% Wt Readings from Last 3 Encounters:  12/22/14 124 lb 8 oz (56.473 kg)  11/20/14 119 lb 12.8 oz (54.341 kg)  11/04/14 121 lb 3.2 oz (54.976 kg)   Constitutional: She appears well-developed and well-nourished. No distress.  Neck: Normal range of motion. Neck supple. No JVD present. No thyromegaly present.  Cardiovascular: Normal rate, regular rhythm and normal heart sounds.  No murmur heard. No BLE edema. Pulmonary/Chest: Effort normal and breath sounds normal. No respiratory distress. She has no wheezes.  Psychiatric: She has a normal mood and affect. Her behavior is normal. Judgment and thought content normal.   Lab Results  Component Value Date   WBC 7.0 02/17/2014   HGB 13.3 02/17/2014   HCT 41 02/17/2014   PLT 258 02/17/2014   GLUCOSE 89 09/11/2014   CHOL 166 02/17/2014   TRIG 106 02/17/2014   HDL 73* 02/17/2014   LDLCALC 72 02/17/2014   ALT 24 02/17/2014   AST 27 02/17/2014   NA 138 09/11/2014   K 4.2  09/11/2014   CL 102 09/11/2014   CREATININE 1.0 09/11/2014   BUN 23 09/11/2014   CO2 31 09/11/2014   TSH 2.41 02/17/2014    Dg Hip Bilateral W/pelvis  03/11/2011   *RADIOLOGY REPORT*  Clinical Data: 2-3 months persistent sacroiliac and hip joint pain without trauma.  BILATERAL HIP WITH PELVIS - 4+ VIEW  [facility].  Findings: Stable slight inferior degenerative change left sacroiliac joint seen.  Bilateral sacroiliac and hip joints appear normal for age.  Unchanged facet degenerative joint disease L4-5 and L5-S1.  IMPRESSION:  1.  Stable slight inferior left sacroiliac and bilateral L4-5 and L5-S1 degenerative joint disease. 2.  Otherwise, negative.  Original Report Authenticated By: Barnie DelMINTA E. PHILLIPS, M.D.      Assessment & Plan:   Problem List Items Addressed This Visit    Anxiety state - Primary    Interval hx reviewed -currently stable without meds Intermittent chronic symptoms exac by death of spouse 07/2014 (prev spouse with rapidly progressing dementia) and alcoholic son who has been living with pt since 07/2014 -her was in rehab in [place] x 1 [place], back home 12/12/14 Intol prior trials: zoloft, prozac, wellbutrin, effexor, citalopram, paxil and elavil Started 07/2011 on low dose xanax trial to use prn anxiety attacks - taken prn through 08/2012 intermittently  Attending  counseling as needed -hospice or [facility] as needed Support offered - follow up 3-6 months, sooner if problem    Dyslipidemia    On statin - tolerating well - Check labs now The current medical regimen is effective;  continue present plan and medications.     Relevant Orders      Lipid panel   Essential hypertension    BP Readings from Last 3 Encounters:  12/22/14 132/78  11/20/14 120/62  11/04/14 138/80   The current medical regimen is effective; doing well without amlodipine continue present plan and medications: ACEI + maxide    Relevant Orders      Basic metabolic  panel   Osteopenia    DEXA at [facility] summer 2014 reviewed. Schedule summer 2016 when due No longer on calcium with vitamin D supplementation as per gynecology Interval history reviewed, no changes recommended      Relevant Orders      DG Bone Density    Other Visit Diagnoses    Need for prophylactic vaccination against Streptococcus pneumoniae (pneumococcus)        Relevant Orders       Pneumococcal conjugate vaccine 13-valent (Completed)

## 2014-12-22 NOTE — Assessment & Plan Note (Signed)
On statin - tolerating well - Check labs now The current medical regimen is effective;  continue present plan and medications.

## 2015-01-13 ENCOUNTER — Other Ambulatory Visit (INDEPENDENT_AMBULATORY_CARE_PROVIDER_SITE_OTHER): Payer: Medicare Other

## 2015-01-13 DIAGNOSIS — E785 Hyperlipidemia, unspecified: Secondary | ICD-10-CM

## 2015-01-13 DIAGNOSIS — I10 Essential (primary) hypertension: Secondary | ICD-10-CM

## 2015-01-13 LAB — BASIC METABOLIC PANEL
BUN: 35 mg/dL — AB (ref 6–23)
CALCIUM: 9.7 mg/dL (ref 8.4–10.5)
CO2: 28 mEq/L (ref 19–32)
Chloride: 107 mEq/L (ref 96–112)
Creatinine, Ser: 1.28 mg/dL — ABNORMAL HIGH (ref 0.40–1.20)
GFR: 42.18 mL/min — ABNORMAL LOW (ref >60.00)
GLUCOSE: 80 mg/dL (ref 70–99)
Potassium: 4.5 mEq/L (ref 3.5–5.1)
Sodium: 141 mEq/L (ref 135–145)

## 2015-01-13 LAB — LIPID PANEL
Cholesterol: 173 mg/dL (ref 0–200)
HDL: 70.2 mg/dL (ref >39.00)
LDL Cholesterol: 75 mg/dL (ref 0–99)
NonHDL: 102.8
Total CHOL/HDL Ratio: 2
Triglycerides: 139 mg/dL (ref 0.0–149.0)
VLDL: 27.8 mg/dL (ref 0.0–40.0)

## 2015-01-15 DIAGNOSIS — Z1211 Encounter for screening for malignant neoplasm of colon: Secondary | ICD-10-CM | POA: Diagnosis not present

## 2015-01-15 DIAGNOSIS — K625 Hemorrhage of anus and rectum: Secondary | ICD-10-CM | POA: Diagnosis not present

## 2015-01-15 DIAGNOSIS — K5730 Diverticulosis of large intestine without perforation or abscess without bleeding: Secondary | ICD-10-CM | POA: Diagnosis not present

## 2015-01-15 DIAGNOSIS — Z86010 Personal history of colonic polyps: Secondary | ICD-10-CM | POA: Diagnosis not present

## 2015-02-23 DIAGNOSIS — Z6823 Body mass index (BMI) 23.0-23.9, adult: Secondary | ICD-10-CM | POA: Diagnosis not present

## 2015-02-23 DIAGNOSIS — E789 Disorder of lipoprotein metabolism, unspecified: Secondary | ICD-10-CM | POA: Diagnosis not present

## 2015-02-23 DIAGNOSIS — I10 Essential (primary) hypertension: Secondary | ICD-10-CM | POA: Diagnosis not present

## 2015-02-23 DIAGNOSIS — Z01419 Encounter for gynecological examination (general) (routine) without abnormal findings: Secondary | ICD-10-CM | POA: Diagnosis not present

## 2015-02-25 DIAGNOSIS — K5730 Diverticulosis of large intestine without perforation or abscess without bleeding: Secondary | ICD-10-CM | POA: Diagnosis not present

## 2015-02-25 DIAGNOSIS — K625 Hemorrhage of anus and rectum: Secondary | ICD-10-CM | POA: Diagnosis not present

## 2015-02-25 DIAGNOSIS — Z86010 Personal history of colonic polyps: Secondary | ICD-10-CM | POA: Diagnosis not present

## 2015-02-25 DIAGNOSIS — Z1211 Encounter for screening for malignant neoplasm of colon: Secondary | ICD-10-CM | POA: Diagnosis not present

## 2015-03-27 ENCOUNTER — Encounter: Payer: Self-pay | Admitting: Podiatry

## 2015-03-27 ENCOUNTER — Ambulatory Visit (INDEPENDENT_AMBULATORY_CARE_PROVIDER_SITE_OTHER): Payer: Medicare Other | Admitting: Podiatry

## 2015-03-27 VITALS — BP 169/86 | HR 74 | Resp 18

## 2015-03-27 DIAGNOSIS — M79676 Pain in unspecified toe(s): Secondary | ICD-10-CM

## 2015-03-27 DIAGNOSIS — B351 Tinea unguium: Secondary | ICD-10-CM

## 2015-03-27 NOTE — Patient Instructions (Signed)
Over the counter treatment for nail fungus is called fungi-nail We could consider doing a prescription for Nuvail to help with the appearance of your nails It was very nice to meet you. Have a good weekend.   Onychomycosis/Fungal Toenails  WHAT IS IT? An infection that lies within the keratin of your nail plate that is caused by a fungus.  WHY ME? Fungal infections affect all ages, sexes, races, and creeds.  There may be many factors that predispose you to a fungal infection such as age, coexisting medical conditions such as diabetes, or an autoimmune disease; stress, medications, fatigue, genetics, etc.  Bottom line: fungus thrives in a warm, moist environment and your shoes offer such a location.  IS IT CONTAGIOUS? Theoretically, yes.  You do not want to share shoes, nail clippers or files with someone who has fungal toenails.  Walking around barefoot in the same room or sleeping in the same bed is unlikely to transfer the organism.  It is important to realize, however, that fungus can spread easily from one nail to the next on the same foot.  HOW DO WE TREAT THIS?  There are several ways to treat this condition.  Treatment may depend on many factors such as age, medications, pregnancy, liver and kidney conditions, etc.  It is best to ask your doctor which options are available to you.  1. No treatment.   Unlike many other medical concerns, you can live with this condition.  However for many people this can be a painful condition and may lead to ingrown toenails or a bacterial infection.  It is recommended that you keep the nails cut short to help reduce the amount of fungal nail. 2. Topical treatment.  These range from herbal remedies to prescription strength nail lacquers.  About 40-50% effective, topicals require twice daily application for approximately 9 to 12 months or until an entirely new nail has grown out.  The most effective topicals are medical grade medications available through  physicians offices. 3. Oral antifungal medications.  With an 80-90% cure rate, the most common oral medication requires 3 to 4 months of therapy and stays in your system for a year as the new nail grows out.  Oral antifungal medications do require blood work to make sure it is a safe drug for you.  A liver function panel will be performed prior to starting the medication and after the first month of treatment.  It is important to have the blood work performed to avoid any harmful side effects.  In general, this medication safe but blood work is required. 4. Laser Therapy.  This treatment is performed by applying a specialized laser to the affected nail plate.  This therapy is noninvasive, fast, and non-painful.  It is not covered by insurance and is therefore, out of pocket.  The results have been very good with a 80-95% cure rate.  The [facility] is the only practice in the area to offer this therapy. 5. Permanent Nail Avulsion.  Removing the entire nail so that a new nail will not grow back.

## 2015-03-27 NOTE — Progress Notes (Signed)
Subjective:     Patient ID: Erika Gibson, female   DOB: 26-Nov-1928, 81 y.o.   MRN: [id number]  HPI 81 year old female presents today for painful, elongated toenails for which she is unable to trim herself. She also states her nails have been splitting and are brittle. Denies any redness or drainage from around the nail sites. No other complaints at this time.   Review of Systems  All other systems reviewed and are negative.      Objective:   Physical Exam AAO x3, NAD DP/PT pulses palpable bilaterally, CRT less than 3 seconds Protective sensation intact with Simms Weinstein monofilament, vibratory sensation intact, Achilles tendon reflex intact Nails hypertrophic, dystrophic, elongated, discolored, brittle x 10. There is tenderness to palpation over nails 1-5 bilaterally. No drainage or erythema around the nail sites.  No areas of tenderness to bilateral lower extremities. MMT 5/5, ROM WNL.  No open lesions or pre-ulcerative lesions.  No overlying edema, erythema, increase in warmth to bilateral lower extremities.  No pain with calf compression, swelling, warmth, erythema bilaterally.      Assessment:     81 year old female with symptomatic onychomycosis     Plan:     -Treatment options discussed including all alternatives, risks, complications -Nails sharply debrided x 10 without complications/bleeding -Discussed daily foot inspection -Discussed various treatment options for nail fungus. She would like to start with OTC treatment. Also discussed possible nuvail.  -Follow-up in 3 months or sooner if any problems arise. In the meantime, encouraged to call with any questions/concerns/change in symptoms.

## 2015-04-22 ENCOUNTER — Ambulatory Visit: Payer: Medicare Other | Admitting: Internal Medicine

## 2015-04-23 ENCOUNTER — Ambulatory Visit (INDEPENDENT_AMBULATORY_CARE_PROVIDER_SITE_OTHER): Payer: Medicare Other | Admitting: Internal Medicine

## 2015-04-23 ENCOUNTER — Encounter: Payer: Self-pay | Admitting: Internal Medicine

## 2015-04-23 VITALS — BP 132/80 | HR 72 | Temp 97.8°F | Wt 119.1 lb

## 2015-04-23 DIAGNOSIS — F411 Generalized anxiety disorder: Secondary | ICD-10-CM

## 2015-04-23 DIAGNOSIS — F32A Depression, unspecified: Secondary | ICD-10-CM

## 2015-04-23 DIAGNOSIS — F329 Major depressive disorder, single episode, unspecified: Secondary | ICD-10-CM | POA: Diagnosis not present

## 2015-04-23 DIAGNOSIS — G4700 Insomnia, unspecified: Secondary | ICD-10-CM | POA: Diagnosis not present

## 2015-04-23 MED ORDER — ZOLPIDEM TARTRATE 5 MG PO TABS: 5.0000 mg | ORAL_TABLET | Freq: Every evening | ORAL | Status: DC | PRN

## 2015-04-23 MED ORDER — ALPRAZOLAM 0.25 MG PO TABS: 0.2500 mg | ORAL_TABLET | Freq: Two times a day (BID) | ORAL | Status: DC | PRN

## 2015-04-23 MED ORDER — ESCITALOPRAM OXALATE 10 MG PO TABS: 10.0000 mg | ORAL_TABLET | Freq: Every day | ORAL | Status: DC

## 2015-04-23 NOTE — Patient Instructions (Signed)
Please take all new medication as prescribed - the lexapro 10 mg per day, as well as the xanax and ambien as needed  Please continue all other medications as before, and refills have been done if requested.  Please have the pharmacy call with any other refills you may need.  Please keep your appointments with your specialists as you may have planned

## 2015-04-23 NOTE — Assessment & Plan Note (Signed)
Mild to mod, for ambien prn limited rx,  to f/u any worsening symptoms or concerns

## 2015-04-23 NOTE — Progress Notes (Signed)
Pre visit review using our clinic review tool, if applicable. No additional management support is needed unless otherwise documented below in the visit note. 

## 2015-04-23 NOTE — Assessment & Plan Note (Signed)
Mild to mod, for lexapro 10 qd, to help with anxiety as well, to f/u any worsening symptoms or concerns

## 2015-04-23 NOTE — Progress Notes (Signed)
Subjective:    Patient ID: Erika Gibson, female    DOB: 10/13/1928, 81 y.o.   MRN: [id number]  HPI  Here to c/o ongoing stressors difficult to deal with, c/o Muscle tension to the back, Diarrhea for 1 wk, one last bout yesterday, none today and no fever, better with immodium., Son is alcoholic, lives with her, cant leave, has no job. She sees no way out. Cant get to sleep most nights.  Has not tried SSRI in past.  Xanax and Remus Lofflerambien recently helped but now out. Has had mild worsening depressive symptoms, but no suicidal ideation, or panic  Pt denies chest pain, increased sob or doe, wheezing, orthopnea, PND, increased LE swelling, palpitations, dizziness or syncope.  Pt denies new neurological symptoms such as new headache, or facial or extremity weakness or numbness   Plan to f/u with Dr Felicity Coyerleschber for cpx July 2016 Past Medical History  Diagnosis Date  . Colitis, collagenous 2010  . RENAL CALCULUS   . HYPERTENSION   . HYPERLIPIDEMIA   . DEPRESSION   . ASTHMA   . Anxiety state, unspecified   . COLONIC POLYPS, HX OF 06/2002    adenomatous polyp  . Osteopenia    Past Surgical History  Procedure Laterality Date  . Tonsillectomy    . Dilation and curettage of uterus    . Ankle surgery      removed pins from ankle bone  . Breast surgery      Implants    reports that she has quit smoking. She does not have any smokeless tobacco history on file. She reports that she does not drink alcohol or use illicit drugs. family history includes Alzheimer's disease in her father, mother, and other; Breast cancer in her paternal aunt; Colon polyps in her brother; Heart disease in her father. Allergies  Allergen Reactions  . Mesalamine     REACTION: fuzzy headed and unsteady   Current Outpatient Prescriptions on File Prior to Visit  Medication Sig Dispense Refill  . atorvastatin (LIPITOR) 10 MG tablet Take 10 mg by mouth daily.      . Calcium Citrate-Vitamin D (CITRACAL/VITAMIN D PO) Take by mouth  daily.    . diphenoxylate-atropine (LOMOTIL) 2.5-0.025 MG per tablet   5  . lisinopril (PRINIVIL,ZESTRIL) 10 MG tablet Take 10 mg by mouth daily.      . Multiple Vitamin (MULTIVITAMIN) tablet Take 1 tablet by mouth daily.      Marland Kitchen. PEG 3350-KCl-NaBcb-NaCl-NaSulf (PEG-3350/ELECTROLYTES) 236 G SOLR See admin instructions.  0  . Probiotic Product (PHILLIPS COLON HEALTH) CAPS Take by mouth daily.      Marland Kitchen. triamterene-hydrochlorothiazide (MAXZIDE-25) 37.5-25 MG per tablet Take 1 tablet by mouth daily.       No current facility-administered medications on file prior to visit.   Review of Systems  Constitutional: Negative for unusual diaphoresis or night sweats HENT: Negative for ringing in ear or discharge Eyes: Negative for double vision or worsening visual disturbance.  Respiratory: Negative for choking and stridor.   Gastrointestinal: Negative for vomiting or other signifcant bowel change Genitourinary: Negative for hematuria or change in urine volume.  Musculoskeletal: Negative for other MSK pain or swelling Skin: Negative for color change and worsening wound.  Neurological: Negative for tremors and numbness other than noted  Psychiatric/Behavioral: Negative for decreased concentration or agitation other than above        Objective:   Physical Exam BP 132/80 mmHg  Pulse 72  Temp(Src) 97.8 F (36.6 C) (Oral)  Wt 119 lb 1.3 oz (54.014 kg)  SpO2 95% VS noted,  Constitutional: Pt appears in no significant distress HENT: Head: NCAT.  Right Ear: External ear normal.  Left Ear: External ear normal.  Eyes: . Pupils are equal, round, and reactive to light. Conjunctivae and EOM are normal Neck: Normal range of motion. Neck supple.  Cardiovascular: Normal rate and regular rhythm.   Pulmonary/Chest: Effort normal and breath sounds without rales or wheezing.  Neurological: Pt is alert. Not confused , motor grossly intact Skin: Skin is warm. No rash, no LE edema Psychiatric: Pt behavior is  normal. No agitation. mod nervous, + depressed affect    Assessment & Plan:

## 2015-04-23 NOTE — Assessment & Plan Note (Signed)
mod, for limited xanax prn,  to f/u any worsening symptoms or concerns

## 2015-06-23 ENCOUNTER — Ambulatory Visit (INDEPENDENT_AMBULATORY_CARE_PROVIDER_SITE_OTHER): Payer: Medicare Other | Admitting: Internal Medicine

## 2015-06-23 ENCOUNTER — Encounter: Payer: Self-pay | Admitting: Internal Medicine

## 2015-06-23 VITALS — BP 126/70 | HR 67 | Temp 97.7°F | Ht 64.0 in | Wt 118.0 lb

## 2015-06-23 DIAGNOSIS — F411 Generalized anxiety disorder: Secondary | ICD-10-CM | POA: Diagnosis not present

## 2015-06-23 DIAGNOSIS — F329 Major depressive disorder, single episode, unspecified: Secondary | ICD-10-CM | POA: Diagnosis not present

## 2015-06-23 DIAGNOSIS — Z0000 Encounter for general adult medical examination without abnormal findings: Secondary | ICD-10-CM

## 2015-06-23 DIAGNOSIS — F32A Depression, unspecified: Secondary | ICD-10-CM

## 2015-06-23 DIAGNOSIS — I10 Essential (primary) hypertension: Secondary | ICD-10-CM | POA: Diagnosis not present

## 2015-06-23 MED ORDER — DULOXETINE HCL 20 MG PO CPEP: 20.0000 mg | ORAL_CAPSULE | Freq: Every day | ORAL | Status: DC

## 2015-06-23 NOTE — Progress Notes (Signed)
Subjective:    Patient ID: Erika Gibson, female    DOB: [birth year], 81 y.o.   MRN: [id number]  HPI   Here for medicare wellness/annual visit  Diet: heart healthy  Physical activity: sedentary Depression/mood screen: reviewed - on tx Hearing: intact to whispered voice Visual acuity: grossly normal, performs annual eye exam  ADLs: capable Fall risk: none Home safety: good Cognitive evaluation: intact to orientation, naming, recall and repetition EOL planning: adv directives, full code/ I agree  I have personally reviewed and have noted 1. The patient's medical and social history 2. Their use of alcohol, tobacco or illicit drugs 3. Their current medications and supplements 4. The patient's functional ability including ADL's, fall risks, home safety risks and hearing or visual impairment. 5. Diet and physical activities 6. Evidence for depression or mood disorders  Also reviewed chronic medical conditions, interval events and current concerns  Past Medical History  Diagnosis Date  . Colitis, collagenous 2010  . RENAL CALCULUS   . HYPERTENSION   . HYPERLIPIDEMIA   . DEPRESSION   . ASTHMA   . Anxiety state, unspecified   . COLONIC POLYPS, HX OF 06/2002    adenomatous polyp  . Osteopenia    Family History  Problem Relation Age of Onset  . Alzheimer's disease Mother   . Heart disease Father   . Alzheimer's disease Father   . Colon polyps Brother   . Breast cancer Paternal Aunt   . Alzheimer's disease Other    History  Substance Use Topics  . Smoking status: Former Games developer  . Smokeless tobacco: Not on file     Comment: Stopped 45 years ago  . Alcohol Use: No    Review of Systems  Constitutional: Positive for fatigue. Negative for unexpected weight change.  Respiratory: Negative for cough, shortness of breath and wheezing.   Cardiovascular: Negative for chest pain, palpitations and leg swelling.  Gastrointestinal: Negative for nausea, abdominal pain and diarrhea.   Neurological: Negative for dizziness, weakness, light-headedness and headaches.  Psychiatric/Behavioral: Positive for sleep disturbance. Negative for suicidal ideas, self-injury and dysphoric mood. The patient is nervous/anxious.   All other systems reviewed and are negative.   Patient Care Team: Newt Lukes, MD as PCP - General Annamaria Helling, MD as Consulting Physician (Obstetrics and Gynecology) Charna Elizabeth, MD as Consulting Physician (Gastroenterology) [facility] Harle Stanford., MD as Consulting Physician (Urology)     Objective:    Physical Exam  Constitutional: She appears well-developed and well-nourished. No distress.  Cardiovascular: Normal rate, regular rhythm and normal heart sounds.   No murmur heard. Pulmonary/Chest: Effort normal and breath sounds normal. No respiratory distress.  Musculoskeletal: She exhibits no edema.  Psychiatric:  Flat affect    BP 126/70 mmHg  Pulse 67  Temp(Src) 97.7 F (36.5 C) (Oral)  Ht  (1.626 m)  Wt 118 lb (53.524 kg)  BMI 20.24 kg/m2  SpO2 97% Wt Readings from Last 3 Encounters:  06/23/15 118 lb (53.524 kg)  04/23/15 119 lb 1.3 oz (54.014 kg)  12/22/14 124 lb 8 oz (56.473 kg)    Lab Results  Component Value Date   WBC 7.0 02/17/2014   HGB 13.3 02/17/2014   HCT 41 02/17/2014   PLT 258 02/17/2014   GLUCOSE 80 01/13/2015   CHOL 173 01/13/2015   TRIG 139.0 01/13/2015   HDL 70.20 01/13/2015   LDLCALC 75 01/13/2015   ALT 24 02/17/2014   AST 27 02/17/2014   NA 141 01/13/2015  K 4.5 01/13/2015   CL 107 01/13/2015   CREATININE 1.28* 01/13/2015   BUN 35* 01/13/2015   CO2 28 01/13/2015   TSH 2.41 02/17/2014    Dg Hip Bilateral W/pelvis  03/11/2011   *RADIOLOGY REPORT*  Clinical Data: 2-3 months persistent sacroiliac and hip joint pain without trauma.  BILATERAL HIP WITH PELVIS - 4+ VIEW  [facility]  Findings: Stable slight inferior degenerative change left sacroiliac  joint seen.  Bilateral sacroiliac and hip joints appear normal for age.  Unchanged facet degenerative joint disease L4-5 and L5-S1.  IMPRESSION:  1.  Stable slight inferior left sacroiliac and bilateral L4-5 and L5-S1 degenerative joint disease. 2.  Otherwise, negative.  Original Report Authenticated By: Barnie DelMINTA E. PHILLIPS, M.D.      Assessment & Plan:   AWV/z00.00 - Today patient counseled on age appropriate routine health concerns for screening and prevention, each reviewed and up to date or declined. Immunizations reviewed and up to date or declined. Labs reviewed. Risk factors for depression reviewed and addressed. Hearing function and visual acuity are intact. ADLs screened and addressed as needed. Functional ability and level of safety reviewed and appropriate. Education, counseling and referrals performed based on assessed risks today. Patient provided with a copy of personalized plan for preventive services.  Problem List Items Addressed This Visit    Anxiety state    Interval hx reviewed -currently in "flare" of depression since spring 2016 Intermittent chronic symptoms exac by death of spouse 07/2014 (prev spouse with rapidly progressing dementia) and alcoholic son who has been living with pt since 07/2014 -[place], back home 12/12/14 but still drinking on occasion to self manage his sx of anxiety and depression Intol prior trials: zoloft, prozac, wellbutrin, effexor, citalopram, paxil and elavil - most recently taking lexapro 5/16 for 2+ mo - feels ineffective and interested in change of meds Started 07/2011 on low dose xanax trial to use prn anxiety attacks - taken prn through 08/2012 intermittently  Will wean of SSRI and start generic Cymbalta now - we reviewed potential risk/benefit and possible side effects - pt understands and agrees to same  Refer to behav health - prev in hospice counseling and has found Al Anon "useless" Support offered - follow up 3-6 months, sooner  if problem      Relevant Medications   DULoxetine (CYMBALTA) 20 MG capsule   Other Relevant Orders   Ambulatory referral to Psychology   Depression    See anxiety Denies SI/HI Support offered and med changes as above      Relevant Medications   DULoxetine (CYMBALTA) 20 MG capsule   Other Relevant Orders   Ambulatory referral to Psychology   Essential hypertension    BP Readings from Last 3 Encounters:  06/23/15 126/70  04/23/15 132/80  03/27/15 169/86   The current medical regimen is effective; doing well without amlodipine continue present plan and medications: ACEI + maxide       Other Visit Diagnoses    Routine general medical examination at a health care facility    -  Primary        Rene PaciValerie Leschber, MD

## 2015-06-23 NOTE — Assessment & Plan Note (Signed)
BP Readings from Last 3 Encounters:  06/23/15 126/70  04/23/15 132/80  03/27/15 169/86   The current medical regimen is effective; doing well without amlodipine continue present plan and medications: ACEI + maxide

## 2015-06-23 NOTE — Progress Notes (Signed)
Pre visit review using our clinic review tool, if applicable. No additional management support is needed unless otherwise documented below in the visit note. 

## 2015-06-23 NOTE — Assessment & Plan Note (Signed)
See anxiety Denies SI/HI Support offered and med changes as above

## 2015-06-23 NOTE — Assessment & Plan Note (Signed)
Interval hx reviewed -currently in "flare" of depression since spring 2016 Intermittent chronic symptoms exac by death of spouse 07/2014 (prev spouse with rapidly progressing dementia) and alcoholic son who has been living with pt since 07/2014 -he was in [place]hab [place] 1 [place], back home 12/12/14 but still drinking on occasion to self manage his sx of anxiety and depression Intol prior trials: zoloft, prozac, wellbutrin, effexor, citalopram, paxil and elavil - most recently taking lexapro 5/16 for 2+ mo - feels ineffective and interested in change of meds Started 07/2011 on low dose xanax trial to use prn anxiety attacks - taken prn through 08/2012 intermittently  Will wean of SSRI and start generic Cymbalta now - we reviewed potential risk/benefit and possible side effects - pt understands and agrees to same  Refer to behav health - prev in hospice counseling and has found Al Anon "useless" Support offered - follow up 3-6 months, sooner if problem

## 2015-06-23 NOTE — Patient Instructions (Addendum)
It was good to see you today.  We have reviewed your prior records including labs and tests today  Health Maintenance reviewed - all recommended immunizations and age-appropriate screenings are up-to-date.  Test(s) ordered today. Your results will be released to [place] (or called to you) after review, usually within 72hours after test completion. If any changes need to be made, you will be notified at that same time.  Medications reviewed and updated - wean off lexapro and start low dose cymbalta for depression and anxiety symptoms - no other changes recommended at this time.  Your prescription(s) have been submitted to your pharmacy. Please take as directed and contact our office if you believe you are having problem(s) with the medication(s).  we'll make referral to [facility] for counseling. Our office will contact you regarding appointment(s) once made.  Please schedule followup in 3 months for review and medication titration (if needed), call sooner if problems.  Health Maintenance Adopting a healthy lifestyle and getting preventive care can go a long way to promote health and wellness. Talk with your health care provider about what schedule of regular examinations is right for you. This is a good chance for you to check in with your provider about disease prevention and staying healthy. In between checkups, there are plenty of things you can do on your own. Experts have done a lot of research about which lifestyle changes and preventive measures are most likely to keep you healthy. Ask your health care provider for more information. WEIGHT AND DIET  Eat a healthy diet  Be sure to include plenty of vegetables, fruits, low-fat dairy products, and lean protein.  Do not eat a lot of foods high in solid fats, added sugars, or salt.  Get regular exercise. This is one of the most important things you can do for your health.  Most adults should exercise for at least 150 minutes each  week. The exercise should increase your heart rate and make you sweat (moderate-intensity exercise).  Most adults should also do strengthening exercises at least twice a week. This is in addition to the moderate-intensity exercise.  Maintain a healthy weight  Body mass index (BMI) is a measurement that can be used to identify possible weight problems. It estimates body fat based on height and weight. Your health care provider can help determine your BMI and help you achieve or maintain a healthy weight.  For females 56 years of age and older:   A BMI below 18.5 is considered underweight.  A BMI of 18.5 to 24.9 is normal.  A BMI of 25 to 29.9 is considered overweight.  A BMI of 30 and above is considered obese.  Watch levels of cholesterol and blood lipids  You should start having your blood tested for lipids and cholesterol at 79 years of age, then have this test every 5 years.  You may need to have your cholesterol levels checked more often if:  Your lipid or cholesterol levels are high.  You are older than 79 years of age.  You are at high risk for heart disease.  CANCER SCREENING   Lung Cancer  Lung cancer screening is recommended for adults 56-74 years old who are at high risk for lung cancer because of a history of smoking.  A yearly low-dose CT scan of the lungs is recommended for people who:  Currently smoke.  Have quit within the past 15 years.  Have at least a 30-pack-year history of smoking. A pack year is  smoking an average of one pack of cigarettes a day for 1 year.  Yearly screening should continue until it has been 15 years since you quit.  Yearly screening should stop if you develop a health problem that would prevent you from having lung cancer treatment.  Breast Cancer  Practice breast self-awareness. This means understanding how your breasts normally appear and feel.  It also means doing regular breast self-exams. Let your health care provider  know about any changes, no matter how small.  If you are in your 20s or 30s, you should have a clinical breast exam (CBE) by a health care provider every 1-3 years as part of a regular health exam.  If you are 18 or older, have a CBE every year. Also consider having a breast X-ray (mammogram) every year.  If you have a family history of breast cancer, talk to your health care provider about genetic screening.  If you are at high risk for breast cancer, talk to your health care provider about having an MRI and a mammogram every year.  Breast cancer gene (BRCA) assessment is recommended for women who have family members with BRCA-related cancers. BRCA-related cancers include:  Breast.  Ovarian.  Tubal.  Peritoneal cancers.  Results of the assessment will determine the need for genetic counseling and BRCA1 and BRCA2 testing. Cervical Cancer Routine pelvic examinations to screen for cervical cancer are no longer recommended for nonpregnant women who are considered low risk for cancer of the pelvic organs (ovaries, uterus, and vagina) and who do not have symptoms. A pelvic examination may be necessary if you have symptoms including those associated with pelvic infections. Ask your health care provider if a screening pelvic exam is right for you.   The Pap test is the screening test for cervical cancer for women who are considered at risk.  If you had a hysterectomy for a problem that was not cancer or a condition that could lead to cancer, then you no longer need Pap tests.  If you are older than 65 years, and you have had normal Pap tests for the past 10 years, you no longer need to have Pap tests.  If you have had past treatment for cervical cancer or a condition that could lead to cancer, you need Pap tests and screening for cancer for at least 20 years after your treatment.  If you no longer get a Pap test, assess your risk factors if they change (such as having a new sexual partner).  This can affect whether you should start being screened again.  Some women have medical problems that increase their chance of getting cervical cancer. If this is the case for you, your health care provider may recommend more frequent screening and Pap tests.  The human papillomavirus (HPV) test is another test that may be used for cervical cancer screening. The HPV test looks for the virus that can cause cell changes in the cervix. The cells collected during the Pap test can be tested for HPV.  The HPV test can be used to screen women 63 years of age and older. Getting tested for HPV can extend the interval between normal Pap tests from three to five years.  An HPV test also should be used to screen women of any age who have unclear Pap test results.  After 79 years of age, women should have HPV testing as often as Pap tests.  Colorectal Cancer  This type of cancer can be detected and often prevented.  Routine colorectal cancer screening usually begins at 79 years of age and continues through 79 years of age.  Your health care provider may recommend screening at an earlier age if you have risk factors for colon cancer.  Your health care provider may also recommend using home test kits to check for hidden blood in the stool.  A small camera at the end of a tube can be used to examine your colon directly (sigmoidoscopy or colonoscopy). This is done to check for the earliest forms of colorectal cancer.  Routine screening usually begins at age 3.  Direct examination of the colon should be repeated every 5-10 years through 79 years of age. However, you may need to be screened more often if early forms of precancerous polyps or small growths are found. Skin Cancer  Check your skin from head to toe regularly.  Tell your health care provider about any new moles or changes in moles, especially if there is a change in a mole's shape or color.  Also tell your health care provider if you have  a mole that is larger than the size of a pencil eraser.  Always use sunscreen. Apply sunscreen liberally and repeatedly throughout the day.  Protect yourself by wearing long sleeves, pants, a wide-brimmed hat, and sunglasses whenever you are outside. HEART DISEASE, DIABETES, AND HIGH BLOOD PRESSURE   Have your blood pressure checked at least every 1-2 years. High blood pressure causes heart disease and increases the risk of stroke.  If you are between 48 years and 39 years old, ask your health care provider if you should take aspirin to prevent strokes.  Have regular diabetes screenings. This involves taking a blood sample to check your fasting blood sugar level.  If you are at a normal weight and have a low risk for diabetes, have this test once every three years after 79 years of age.  If you are overweight and have a high risk for diabetes, consider being tested at a younger age or more often. PREVENTING INFECTION  Hepatitis B  If you have a higher risk for hepatitis B, you should be screened for this virus. You are considered at high risk for hepatitis B if:  You were born in a country where hepatitis B is common. Ask your health care provider which countries are considered high risk.  Your parents were born in a high-risk country, and you have not been immunized against hepatitis B (hepatitis B vaccine).  You have HIV or AIDS.  You use needles to inject street drugs.  You live with someone who has hepatitis B.  You have had sex with someone who has hepatitis B.  You get hemodialysis treatment.  You take certain medicines for conditions, including cancer, organ transplantation, and autoimmune conditions. Hepatitis C  Blood testing is recommended for:  Everyone born from [birth year] through 1963.  Anyone with known risk factors for hepatitis C. Sexually transmitted infections (STIs)  You should be screened for sexually transmitted infections (STIs) including gonorrhea and  chlamydia if:  You are sexually active and are younger than 79 years of age.  You are older than 79 years of age and your health care provider tells you that you are at risk for this type of infection.  Your sexual activity has changed since you were last screened and you are at an increased risk for chlamydia or gonorrhea. Ask your health care provider if you are at risk.  If you do not have HIV, but are  at risk, it may be recommended that you take a prescription medicine daily to prevent HIV infection. This is called pre-exposure prophylaxis (PrEP). You are considered at risk if:  You are sexually active and do not regularly use condoms or know the HIV status of your partner(s).  You take drugs by injection.  You are sexually active with a partner who has HIV. Talk with your health care provider about whether you are at high risk of being infected with HIV. If you choose to begin PrEP, you should first be tested for HIV. You should then be tested every 3 months for as long as you are taking PrEP.  PREGNANCY   If you are premenopausal and you may become pregnant, ask your health care provider about preconception counseling.  If you may become pregnant, take 400 to 800 micrograms (mcg) of folic acid every day.  If you want to prevent pregnancy, talk to your health care provider about birth control (contraception). OSTEOPOROSIS AND MENOPAUSE   Osteoporosis is a disease in which the bones lose minerals and strength with aging. This can result in serious bone fractures. Your risk for osteoporosis can be identified using a bone density scan.  If you are 23 years of age or older, or if you are at risk for osteoporosis and fractures, ask your health care provider if you should be screened.  Ask your health care provider whether you should take a calcium or vitamin D supplement to lower your risk for osteoporosis.  Menopause may have certain physical symptoms and risks.  Hormone replacement  therapy may reduce some of these symptoms and risks. Talk to your health care provider about whether hormone replacement therapy is right for you.  HOME CARE INSTRUCTIONS   Schedule regular health, dental, and eye exams.  Stay current with your immunizations.   Do not use any tobacco products including cigarettes, chewing tobacco, or electronic cigarettes.  If you are pregnant, do not drink alcohol.  If you are breastfeeding, limit how much and how often you drink alcohol.  Limit alcohol intake to no more than 1 drink per day for nonpregnant women. One drink equals 12 ounces of beer, 5 ounces of wine, or 1 ounces of hard liquor.  Do not use street drugs.  Do not share needles.  Ask your health care provider for help if you need support or information about quitting drugs.  Tell your health care provider if you often feel depressed.  Tell your health care provider if you have ever been abused or do not feel safe at home. Document Released: 06/13/2011 Document Revised: 04/14/2014 Document Reviewed: 10/30/2013 [facility] Patient Information 2015 [place], [place]. This information is not intended to replace advice given to you by your health care provider. Make sure you discuss any questions you have with your health care provider. Depression Depression is feeling sad, low, down in the dumps, blue, gloomy, or empty. In general, there are two kinds of depression:  Normal sadness or grief. This can happen after something upsetting. It often goes away on its own within 2 weeks. After losing a loved one (bereavement), normal sadness and grief may last longer than two weeks. It usually gets better with time.  Clinical depression. This kind lasts longer than normal sadness or grief. It keeps you from doing the things you normally do in life. It is often hard to function at home, work, or at school. It may affect your relationships with others. Treatment is often needed. GET HELP RIGHT  AWAY  IF:  You have thoughts about hurting yourself or others.  You lose touch with reality (psychotic symptoms). You may:  See or hear things that are not real.  Have untrue beliefs about your life or people around you.  Your medicine is giving you problems. MAKE SURE YOU:  Understand these instructions.  Will watch your condition.  Will get help right away if you are not doing well or get worse. Document Released: 12/31/2010 Document Revised: 04/14/2014 Document Reviewed: 03/29/2012 [facility] Patient Information 2015 [place], [place]. This information is not intended to replace advice given to you by your health care provider. Make sure you discuss any questions you have with your health care provider.

## 2015-06-26 ENCOUNTER — Ambulatory Visit (INDEPENDENT_AMBULATORY_CARE_PROVIDER_SITE_OTHER): Payer: Medicare Other | Admitting: Podiatry

## 2015-06-26 ENCOUNTER — Encounter: Payer: Self-pay | Admitting: Podiatry

## 2015-06-26 DIAGNOSIS — M79676 Pain in unspecified toe(s): Secondary | ICD-10-CM

## 2015-06-26 DIAGNOSIS — B351 Tinea unguium: Secondary | ICD-10-CM | POA: Diagnosis not present

## 2015-06-29 NOTE — Progress Notes (Signed)
Patient ID: Erika Gibson, female   DOB: Feb 17, 1928, 81 y.o.   MRN: [id number]  Subjective: 81 y.o. female returns the office today for painful, elongated, thickened toenails which she is unable to trim herself. Denies any redness or drainage around the nails. Denies any acute changes since last appointment and no new complaints today. Denies any systemic complaints such as fevers, chills, nausea, vomiting.   Objective: AAO 3, NAD DP/PT pulses palpable, CRT less than 3 seconds Protective sensation intact with Simms Weinstein monofilament, Achilles tendon reflex intact.  Nails hypertrophic, dystrophic, elongated, brittle, discolored 10. There is tenderness overlying the nails 1-5 bilaterally. There is no surrounding erythema or drainage along the nail sites. No open lesions or pre-ulcerative lesions are identified. No other areas of tenderness bilateral lower extremities. No overlying edema, erythema, increased warmth. No pain with calf compression, swelling, warmth, erythema.  Assessment: Patient presents with symptomatic onychomycosis  Plan: -Treatment options including alternatives, risks, complications were discussed -Nails sharply debrided 10 without complication/bleeding. -Discussed daily foot inspection. If there are any changes, to call the office immediately.  -Follow-up in 3 months or sooner if any problems are to arise. In the meantime, encouraged to call the office with any questions, concerns, changes symptoms.   Ovid CurdMatthew Margarite Vessel, DPM

## 2015-07-01 ENCOUNTER — Telehealth: Payer: Self-pay | Admitting: Internal Medicine

## 2015-07-01 NOTE — Telephone Encounter (Signed)
I do not see or have any notes for pt.

## 2015-07-01 NOTE — Telephone Encounter (Signed)
Patient stated that you Erika Gibson(Stephanie) called and left a message on her son phone for her to call you, she is returning your call. Please advise

## 2015-08-03 DIAGNOSIS — M899 Disorder of bone, unspecified: Secondary | ICD-10-CM | POA: Diagnosis not present

## 2015-08-03 DIAGNOSIS — M810 Age-related osteoporosis without current pathological fracture: Secondary | ICD-10-CM | POA: Diagnosis not present

## 2015-08-03 LAB — HM DEXA SCAN

## 2015-08-04 DIAGNOSIS — R634 Abnormal weight loss: Secondary | ICD-10-CM | POA: Diagnosis not present

## 2015-08-04 DIAGNOSIS — R159 Full incontinence of feces: Secondary | ICD-10-CM | POA: Diagnosis not present

## 2015-08-04 DIAGNOSIS — K5730 Diverticulosis of large intestine without perforation or abscess without bleeding: Secondary | ICD-10-CM | POA: Diagnosis not present

## 2015-08-04 DIAGNOSIS — R194 Change in bowel habit: Secondary | ICD-10-CM | POA: Diagnosis not present

## 2015-08-11 ENCOUNTER — Encounter: Payer: Self-pay | Admitting: Internal Medicine

## 2015-08-11 ENCOUNTER — Ambulatory Visit (INDEPENDENT_AMBULATORY_CARE_PROVIDER_SITE_OTHER): Payer: Medicare Other | Admitting: Internal Medicine

## 2015-08-11 VITALS — BP 116/68 | HR 77 | Temp 98.5°F | Ht 63.0 in | Wt 117.0 lb

## 2015-08-11 DIAGNOSIS — I10 Essential (primary) hypertension: Secondary | ICD-10-CM

## 2015-08-11 DIAGNOSIS — F411 Generalized anxiety disorder: Secondary | ICD-10-CM | POA: Diagnosis not present

## 2015-08-11 DIAGNOSIS — F329 Major depressive disorder, single episode, unspecified: Secondary | ICD-10-CM

## 2015-08-11 DIAGNOSIS — F32A Depression, unspecified: Secondary | ICD-10-CM

## 2015-08-11 DIAGNOSIS — G4700 Insomnia, unspecified: Secondary | ICD-10-CM | POA: Diagnosis not present

## 2015-08-11 MED ORDER — DULOXETINE HCL 20 MG PO CPEP: 20.0000 mg | ORAL_CAPSULE | Freq: Every day | ORAL | Status: DC

## 2015-08-11 MED ORDER — ZOLPIDEM TARTRATE 5 MG PO TABS: 5.0000 mg | ORAL_TABLET | Freq: Every evening | ORAL | Status: DC | PRN

## 2015-08-11 NOTE — Patient Instructions (Signed)
Ok to take the cymbalta 20 mg  Please continue all other medications as before, and refills have been done if requested - the ambien  Please have the pharmacy call with any other refills you may need.  Please keep your appointments with your specialists as you may have planned

## 2015-08-11 NOTE — Progress Notes (Signed)
Subjective:    Patient ID: Erika Gibson, female    DOB: 27-Jun-1928, 81 y.o.   MRN: [id number]  HPI  Here to f/u,was on lexapro x 2 mo, then changed to cymbalta to help with pain as well per Dr Felicity Coyer, only took 4 days and stopped b/c she became concerned about it (no specific reason), then re-tried lexapro but has diarrhea, 1/2 cup about 6 times per day, stopped lexapro, saw GI, placed on probiotic in the afternoons.  Diarrhea improved, but she states she thinks stopping the lexapro likely helped the diarrhea as well (though did not have this before).  Depressive symtpoms unfortunately worsening agai, with tension, low mood, jumpy, tightness to neck , shoulders and lower back as well. Wt overall stable Wt Readings from Last 3 Encounters:  08/11/15 117 lb (53.071 kg)  06/23/15 118 lb (53.524 kg)  04/23/15 119 lb 1.3 oz (54.014 kg)   Past Medical History  Diagnosis Date  . Colitis, collagenous 2010  . RENAL CALCULUS   . HYPERTENSION   . HYPERLIPIDEMIA   . DEPRESSION   . ASTHMA   . Anxiety state, unspecified   . COLONIC POLYPS, HX OF 06/2002    adenomatous polyp  . Osteopenia    Past Surgical History  Procedure Laterality Date  . Tonsillectomy    . Dilation and curettage of uterus    . Ankle surgery      removed pins from ankle bone  . Breast surgery      Implants    reports that she has quit smoking. She does not have any smokeless tobacco history on file. She reports that she does not drink alcohol or use illicit drugs. family history includes Alzheimer's disease in her father, mother, and other; Breast cancer in her paternal aunt; Colon polyps in her brother; Heart disease in her father. Allergies  Allergen Reactions  . Mesalamine     REACTION: fuzzy headed and unsteady   Current Outpatient Prescriptions on File Prior to Visit  Medication Sig Dispense Refill  . ALPRAZolam (XANAX) 0.25 MG tablet Take 1 tablet (0.25 mg total) by mouth 2 (two) times daily as needed for  anxiety. 60 tablet 0  . atorvastatin (LIPITOR) 10 MG tablet Take 10 mg by mouth daily.      Marland Kitchen lisinopril (PRINIVIL,ZESTRIL) 10 MG tablet Take 10 mg by mouth daily.      . Multiple Vitamin (MULTIVITAMIN) tablet Take 1 tablet by mouth daily.      . Probiotic Product (PHILLIPS COLON HEALTH) CAPS Take by mouth daily.      Marland Kitchen triamterene-hydrochlorothiazide (MAXZIDE-25) 37.5-25 MG per tablet Take 1 tablet by mouth daily.      Marland Kitchen zolpidem (AMBIEN) 5 MG tablet Take 1 tablet (5 mg total) by mouth at bedtime as needed for sleep. 30 tablet 1  . DULoxetine (CYMBALTA) 20 MG capsule Take 1 capsule (20 mg total) by mouth daily. (Patient not taking: Reported on 08/11/2015) 30 capsule 3   No current facility-administered medications on file prior to visit.   Review of Systems  Constitutional: Negative for unusual diaphoresis or night sweats HENT: Negative for ringing in ear or discharge Eyes: Negative for double vision or worsening visual disturbance.  Respiratory: Negative for choking and stridor.   Gastrointestinal: Negative for vomiting or other signifcant bowel change Genitourinary: Negative for hematuria or change in urine volume.  Musculoskeletal: Negative for other MSK pain or swelling Skin: Negative for color change and worsening wound.  Neurological: Negative for tremors  and numbness other than noted  Psychiatric/Behavioral: Negative for decreased concentration or agitation other than above       Objective:   Physical Exam BP 116/68 mmHg  Pulse 77  Temp(Src) 98.5 F (36.9 C) (Oral)  Ht  (1.6 m)  Wt 117 lb (53.071 kg)  BMI 20.73 kg/m2  SpO2 97% VS noted,  Constitutional: Pt appears in no significant distress HENT: Head: NCAT.  Right Ear: External ear normal.  Left Ear: External ear normal.  Eyes: . Pupils are equal, round, and reactive to light. Conjunctivae and EOM are normal Neck: Normal range of motion. Neck supple.  Cardiovascular: Normal rate and regular rhythm.     Pulmonary/Chest: Effort normal and breath sounds without rales or wheezing.  Abd:  Soft, NT, ND, + BS Neurological: Pt is alert. Not confused , motor grossly intact Skin: Skin is warm. No rash, no LE edema Psychiatric: Pt behavior is ok, tense, nervous, deprssed affect. No agitation.     Assessment & Plan:

## 2015-08-11 NOTE — Progress Notes (Signed)
Pre visit review using our clinic review tool, if applicable. No additional management support is needed unless otherwise documented below in the visit note. 

## 2015-08-13 NOTE — Assessment & Plan Note (Signed)
Ok for cont'd ambien prn, to f/u any worsening symptoms or concerns

## 2015-08-13 NOTE — Assessment & Plan Note (Signed)
To cont xanax prn,  to f/u any worsening symptoms or concerns

## 2015-08-13 NOTE — Assessment & Plan Note (Signed)
D/w pt, ok to take the cymbalta as previously prescribed, declines referral counseling

## 2015-08-13 NOTE — Assessment & Plan Note (Signed)
stable overall by history and exam, recent data reviewed with pt, and pt to continue medical treatment as before,  to f/u any worsening symptoms or concerns BP Readings from Last 3 Encounters:  08/11/15 116/68  06/23/15 126/70  04/23/15 132/80

## 2015-08-31 ENCOUNTER — Encounter: Payer: Self-pay | Admitting: Internal Medicine

## 2015-09-25 ENCOUNTER — Ambulatory Visit: Payer: Medicare Other | Admitting: Podiatry

## 2015-09-28 ENCOUNTER — Encounter: Payer: Self-pay | Admitting: Podiatry

## 2015-09-28 ENCOUNTER — Ambulatory Visit (INDEPENDENT_AMBULATORY_CARE_PROVIDER_SITE_OTHER): Payer: Medicare Other | Admitting: Podiatry

## 2015-09-28 DIAGNOSIS — B351 Tinea unguium: Secondary | ICD-10-CM | POA: Diagnosis not present

## 2015-09-28 DIAGNOSIS — M79676 Pain in unspecified toe(s): Secondary | ICD-10-CM

## 2015-09-28 NOTE — Progress Notes (Signed)
Patient ID: Erika Gibson, female   DOB: Apr 01, 1928, 81 y.o.   MRN: [id number]  Subjective: 81 y.o. female returns the office today for painful, elongated, thickened toenails which she is unable to trim herself. Denies any redness or drainage around the nails. She states approximate one month ago her right heel did hurt and she doesn't have palms putting pressure to her heel. She states the pain did resolve and she no longer has any pain to the heel. Denies any recent injury or trauma. No swelling or redness. Denies any acute changes since last appointment and no new complaints today. Denies any systemic complaints such as fevers, chills, nausea, vomiting.   Objective: AAO 3, NAD DP/PT pulses palpable, CRT less than 3 seconds Protective sensation intact with Simms Weinstein monofilament, Achilles tendon reflex intact.  Nails hypertrophic, dystrophic, elongated, brittle, discolored 10. There is tenderness overlying the nails 1-5 bilaterally. There is no surrounding erythema or drainage along the nail sites. No open lesions or pre-ulcerative lesions are identified.  there is no tenderness palpation along the course of the plantar fascia on the course of the Achilles tendon. There is no pain with lateral compression of the calcaneus. There is no specific area pinpoint bony tenderness and vibratory sensation. No other areas of tenderness bilateral lower extremities. No overlying edema, erythema, increased warmth. No pain with calf compression, swelling, warmth, erythema.  Assessment: Patient presents with symptomatic onychomycosis; apparently resolving right heel pain  Plan: -Treatment options including alternatives, risks, complications were discussed -Nails sharply debrided 10 without complication/bleeding. -I discussed possible etiologies of the heel pain. She currently is not having any symptoms will hold off on x-rays. If the pain reoccurs to call the office. -Discussed daily foot inspection.  If there are any changes, to call the office immediately.  -Follow-up in 3 months or sooner if any problems are to arise. In the meantime, encouraged to call the office with any questions, concerns, changes symptoms.  Ovid CurdMatthew Curby Carswell, DPM

## 2015-10-05 ENCOUNTER — Ambulatory Visit (INDEPENDENT_AMBULATORY_CARE_PROVIDER_SITE_OTHER): Payer: Medicare Other | Admitting: Internal Medicine

## 2015-10-05 ENCOUNTER — Encounter: Payer: Self-pay | Admitting: Internal Medicine

## 2015-10-05 VITALS — BP 110/76 | HR 93 | Temp 97.7°F | Ht 63.0 in | Wt 120.0 lb

## 2015-10-05 DIAGNOSIS — F411 Generalized anxiety disorder: Secondary | ICD-10-CM

## 2015-10-05 DIAGNOSIS — Z23 Encounter for immunization: Secondary | ICD-10-CM

## 2015-10-05 DIAGNOSIS — E785 Hyperlipidemia, unspecified: Secondary | ICD-10-CM | POA: Diagnosis not present

## 2015-10-05 DIAGNOSIS — I10 Essential (primary) hypertension: Secondary | ICD-10-CM | POA: Diagnosis not present

## 2015-10-05 NOTE — Progress Notes (Signed)
Subjective:    Patient ID: Erika Gibson, female    DOB: 12-04-1928, 81 y.o.   MRN: [id number]  HPI  Patient here for follow up - anxiety Also reviewed chronic medical conditions, interval events and current concerns   Past Medical History  Diagnosis Date  . Colitis, collagenous 2010  . RENAL CALCULUS   . HYPERTENSION   . HYPERLIPIDEMIA   . DEPRESSION   . ASTHMA   . Anxiety state, unspecified   . COLONIC POLYPS, HX OF 06/2002    adenomatous polyp  . Osteopenia     Review of Systems  Constitutional: Positive for fatigue. Negative for unexpected weight change.  Respiratory: Negative for cough and shortness of breath.   Cardiovascular: Negative for chest pain and leg swelling.  Psychiatric/Behavioral: Negative for suicidal ideas and self-injury. The patient is nervous/anxious.        Objective:    Physical Exam  Constitutional: She appears well-developed and well-nourished. No distress.  Cardiovascular: Normal rate, regular rhythm and normal heart sounds.   No murmur heard. Pulmonary/Chest: Effort normal and breath sounds normal. No respiratory distress.  Musculoskeletal: She exhibits no edema.    BP 110/76 mmHg  Pulse 93  Temp(Src) 97.7 F (36.5 C) (Oral)  Ht  (1.6 m)  Wt 120 lb (54.432 kg)  BMI 21.26 kg/m2  SpO2 96% Wt Readings from Last 3 Encounters:  10/05/15 120 lb (54.432 kg)  August 27, 2015 117 lb (53.071 kg)  06/23/15 118 lb (53.524 kg)    Lab Results  Component Value Date   WBC 7.0 02/17/2014   HGB 13.3 02/17/2014   HCT 41 02/17/2014   PLT 258 02/17/2014   GLUCOSE 80 01/13/2015   CHOL 173 01/13/2015   TRIG 139.0 01/13/2015   HDL 70.20 01/13/2015   LDLCALC 75 01/13/2015   ALT 24 02/17/2014   AST 27 02/17/2014   NA 141 01/13/2015   K 4.5 01/13/2015   CL 107 01/13/2015   CREATININE 1.28* 01/13/2015   BUN 35* 01/13/2015   CO2 28 01/13/2015   TSH 2.41 02/17/2014    Dg Hip Bilateral W/pelvis  03/11/2011  *RADIOLOGY REPORT* Clinical Data:  2-3 months persistent sacroiliac and hip joint pain without trauma. BILATERAL HIP WITH PELVIS - 4+ VIEW Comparison: [facility] pelvic CT 08/18/2009. Findings: Stable slight inferior degenerative change left sacroiliac joint seen. Bilateral sacroiliac and hip joints appear normal for age. Unchanged facet degenerative joint disease L4-5 and L5-S1. IMPRESSION: 1.  Stable slight inferior left sacroiliac and bilateral L4-5 and L5-S1 degenerative joint disease. 2.  Otherwise, negative. Original Report Authenticated By: Barnie Del, M.D.      Assessment & Plan:   Problem List Items Addressed This Visit    Anxiety state    Interval hx reviewed -Intermittent chronic symptoms exac by situational stressors: death of spouse 08-26-14 (prev spouse with rapidly progressing dementia) and alcoholic son who has been living with pt since 08-26-14, still drinking on occasion to self manage his sx of anxiety and depression Intol prior trials: zoloft, prozac, wellbutrin, effexor, citalopram, paxil, elavil, lexapro then cymbalta -prefers no further SSRI and SNRI trials Rarely takes low dose xanax trial to use prn anxiety attacks - taken intermittently  Declines counseling or behav health needs at this time - prev in hospice counseling and has found [facility] Anon "useless" Support offered - follow up 6 months, sooner if problem Denies SI/HI      Dyslipidemia    On statin - tolerating well -  Check labs annually The current medical regimen is effective;  continue present plan and medications.       Essential hypertension    BP Readings from Last 3 Encounters:  10/05/15 110/76  08/11/15 116/68  06/23/15 126/70   The current medical regimen is effective; doing well without amlodipine continue present plan and medications: ACEI + maxide       Other Visit Diagnoses    Need for prophylactic vaccination and inoculation against influenza    -  Primary    Relevant Orders    Flu vaccine HIGH DOSE PF (Fluzone  High dose)      Time spent with pt today 25 minutes, greater than 50% time spent counseling patient on anxiety/, depression and medication review. Also review of prior records   Rene PaciValerie Vieva Brummitt, MD

## 2015-10-05 NOTE — Patient Instructions (Signed)
It was good to see you today.  We have reviewed your prior records including labs and tests today  Your annual flu shot was given and/or updated today.  Medications reviewed and updated, no changes recommended at this time. Refill on medication(s) as discussed today.  Please schedule followup in 6 months for annual exam and labs, call sooner if problems.  

## 2015-10-05 NOTE — Assessment & Plan Note (Signed)
BP Readings from Last 3 Encounters:  10/05/15 110/76  08/11/15 116/68  06/23/15 126/70   The current medical regimen is effective; doing well without amlodipine continue present plan and medications: ACEI + maxide

## 2015-10-05 NOTE — Assessment & Plan Note (Signed)
Interval hx reviewed -Intermittent chronic symptoms exac by situational stressors: death of spouse 07/2014 (prev spouse with rapidly progressing dementia) and alcoholic son who has been living with pt since 07/2014, still drinking on occasion to self manage his sx of anxiety and depression Intol prior trials: zoloft, prozac, wellbutrin, effexor, citalopram, paxil, elavil, lexapro then cymbalta -prefers no further SSRI and SNRI trials Rarely takes low dose xanax trial to use prn anxiety attacks - taken intermittently  Declines counseling or behav health needs at this time - prev in hospice counseling and has found Al Anon "useless" Support offered - follow up 6 months, sooner if problem Denies SI/HI

## 2015-10-05 NOTE — Progress Notes (Signed)
Pre visit review using our clinic review tool, if applicable. No additional management support is needed unless otherwise documented below in the visit note. 

## 2015-10-05 NOTE — Assessment & Plan Note (Signed)
On statin - tolerating well - Check labs annually The current medical regimen is effective;  continue present plan and medications.

## 2015-10-19 ENCOUNTER — Telehealth: Payer: Self-pay | Admitting: Internal Medicine

## 2015-10-19 NOTE — Telephone Encounter (Signed)
Patient states that she should have had a referral for psychiatric counseling two months ago.  I did not see a referral entered.  Can you please help with this.

## 2015-10-19 NOTE — Telephone Encounter (Signed)
Anytime there is a referral to [facility] we will not be able to see bc that is confidential. Pt will need to contact [facility] themselves.

## 2015-10-20 NOTE — Telephone Encounter (Signed)
Gave patient response and phone number to Behavioral medicine.

## 2015-10-28 ENCOUNTER — Ambulatory Visit (INDEPENDENT_AMBULATORY_CARE_PROVIDER_SITE_OTHER): Payer: Medicare Other | Admitting: Internal Medicine

## 2015-10-28 ENCOUNTER — Encounter: Payer: Self-pay | Admitting: Internal Medicine

## 2015-10-28 VITALS — BP 146/84 | HR 74 | Temp 98.3°F | Ht 64.0 in | Wt 118.0 lb

## 2015-10-28 DIAGNOSIS — F32A Depression, unspecified: Secondary | ICD-10-CM

## 2015-10-28 DIAGNOSIS — I10 Essential (primary) hypertension: Secondary | ICD-10-CM

## 2015-10-28 DIAGNOSIS — F329 Major depressive disorder, single episode, unspecified: Secondary | ICD-10-CM | POA: Diagnosis not present

## 2015-10-28 DIAGNOSIS — F411 Generalized anxiety disorder: Secondary | ICD-10-CM

## 2015-10-28 MED ORDER — BUPROPION HCL ER (XL) 150 MG PO TB24: 150.0000 mg | ORAL_TABLET | Freq: Every day | ORAL | Status: DC

## 2015-10-28 NOTE — Progress Notes (Signed)
Subjective:    Patient ID: Erika Gibson, female    DOB: August 30, 1928, 81 y.o.   MRN: [id number]  HPI  Here with c/o fatigue and depressed, feels lost and not sure what to do, had tried lexapro then changed to cymbalta per Dr Felicity CoyerLeschber but did not take long due to risk of side effects.  Lexapro caused some cognitive slowing/sluggishness. Has tried zoloft in the past per GYN but had diarrhea, causing visit to GI as well.    Denies suicidal ideation, or panic.  Pt denies chest pain, increased sob or doe, wheezing, orthopnea, PND, increased LE swelling, palpitations, dizziness or syncope.   Pt denies polydipsia, polyuria,  Past Medical History  Diagnosis Date  . Colitis, collagenous 2010  . RENAL CALCULUS   . HYPERTENSION   . HYPERLIPIDEMIA   . DEPRESSION   . ASTHMA   . Anxiety state, unspecified   . COLONIC POLYPS, HX OF 06/2002    adenomatous polyp  . Osteopenia    Past Surgical History  Procedure Laterality Date  . Tonsillectomy    . Dilation and curettage of uterus    . Ankle surgery      removed pins from ankle bone  . Breast surgery      Implants    reports that she has quit smoking. She does not have any smokeless tobacco history on file. She reports that she does not drink alcohol or use illicit drugs. family history includes Alzheimer's disease in her father, mother, and other; Breast cancer in her paternal aunt; Colon polyps in her brother; Heart disease in her father. Allergies  Allergen Reactions  . Mesalamine     REACTION: fuzzy headed and unsteady   Current Outpatient Prescriptions on File Prior to Visit  Medication Sig Dispense Refill  . atorvastatin (LIPITOR) 10 MG tablet Take 10 mg by mouth daily.      Marland Kitchen. lisinopril (PRINIVIL,ZESTRIL) 10 MG tablet Take 10 mg by mouth daily.      . Multiple Vitamin (MULTIVITAMIN) tablet Take 1 tablet by mouth daily.      . Probiotic Product (PHILLIPS COLON HEALTH) CAPS Take by mouth daily.      Marland Kitchen. triamterene-hydrochlorothiazide  (MAXZIDE-25) 37.5-25 MG per tablet Take 1 tablet by mouth daily.      Marland Kitchen. zolpidem (AMBIEN) 5 MG tablet Take 1 tablet (5 mg total) by mouth at bedtime as needed for sleep. 30 tablet 5   No current facility-administered medications on file prior to visit.   Review of Systems  Constitutional: Negative for unusual diaphoresis or night sweats HENT: Negative for ringing in ear or discharge Eyes: Negative for double vision or worsening visual disturbance.  Respiratory: Negative for choking and stridor.   Gastrointestinal: Negative for vomiting or other signifcant bowel change Genitourinary: Negative for hematuria or change in urine volume.  Musculoskeletal: Negative for other MSK pain or swelling Skin: Negative for color change and worsening wound.  Neurological: Negative for tremors and numbness other than noted  Psychiatric/Behavioral: Negative for decreased concentration or agitation other than above        Objective:   Physical Exam BP 146/84 mmHg  Pulse 74  Temp(Src) 98.3 F (36.8 C) (Oral)  Ht 5\' 4"  (1.626 m)  Wt 118 lb (53.524 kg)  BMI 20.24 kg/m2  SpO2 96% VS noted,  Constitutional: Pt appears in no significant distress HENT: Head: NCAT.  Right Ear: External ear normal.  Left Ear: External ear normal.  Eyes: . Pupils are equal, round, and  reactive to light. Conjunctivae and EOM are normal Neck: Normal range of motion. Neck supple.  Cardiovascular: Normal rate and regular rhythm.   Pulmonary/Chest: Effort normal and breath sounds without rales or wheezing.  Neurological: Pt is alert. Not confused , motor grossly intact Skin: Skin is warm. No rash, no LE edema Psychiatric: Pt behavior is normal. No agitation. + depressed affect     Assessment & Plan:

## 2015-10-28 NOTE — Patient Instructions (Addendum)
OK to stop the lexapro and cymbalta as you have  Please take all new medication as prescribed - the wellbutrin ER 150 mg per day  Please call in 3 weeks if you feel you need the higher dose  You will be contacted regarding the referral for: counseling  Please continue all other medications as before, and refills have been done if requested.  Please have the pharmacy call with any other refills you may need.  Please keep your appointments with your specialists as you may have planned

## 2015-10-28 NOTE — Progress Notes (Signed)
Pre visit review using our clinic review tool, if applicable. No additional management support is needed unless otherwise documented below in the visit note. 

## 2015-10-31 NOTE — Assessment & Plan Note (Signed)
Mild elev today likely situation, o/w stable overall by history and exam, recent data reviewed with pt, and pt to continue medical treatment as before,  to f/u any worsening symptoms or concerns BP Readings from Last 3 Encounters:  10/28/15 146/84  10/05/15 110/76  08/11/15 116/68

## 2015-10-31 NOTE — Assessment & Plan Note (Addendum)
Mild worsening, for add wellbutrin ER 150 qd, consider incresae in 3 wks if not improved, also referred for counseling

## 2015-10-31 NOTE — Assessment & Plan Note (Signed)
stable overall by history and exam, recent data reviewed with pt, and pt to continue medical treatment as before,  to f/u any worsening symptoms or concerns Lab Results  Component Value Date   WBC 7.0 02/17/2014   HGB 13.3 02/17/2014   HCT 41 02/17/2014   PLT 258 02/17/2014   GLUCOSE 80 01/13/2015   CHOL 173 01/13/2015   TRIG 139.0 01/13/2015   HDL 70.20 01/13/2015   LDLCALC 75 01/13/2015   ALT 24 02/17/2014   AST 27 02/17/2014   NA 141 01/13/2015   K 4.5 01/13/2015   CL 107 01/13/2015   CREATININE 1.28* 01/13/2015   BUN 35* 01/13/2015   CO2 28 01/13/2015   TSH 2.41 02/17/2014

## 2015-11-02 ENCOUNTER — Ambulatory Visit (INDEPENDENT_AMBULATORY_CARE_PROVIDER_SITE_OTHER): Payer: Medicare Other | Admitting: Licensed Clinical Social Worker

## 2015-11-02 DIAGNOSIS — F4322 Adjustment disorder with anxiety: Secondary | ICD-10-CM

## 2015-11-16 ENCOUNTER — Telehealth: Payer: Self-pay

## 2015-11-16 NOTE — Telephone Encounter (Signed)
PA with clinical data entered via cover my meds.

## 2015-11-17 NOTE — Telephone Encounter (Signed)
PA approved pharmacy notified 

## 2015-11-19 DIAGNOSIS — Z1231 Encounter for screening mammogram for malignant neoplasm of breast: Secondary | ICD-10-CM | POA: Diagnosis not present

## 2015-11-23 NOTE — Telephone Encounter (Signed)
error 

## 2015-12-10 ENCOUNTER — Ambulatory Visit (INDEPENDENT_AMBULATORY_CARE_PROVIDER_SITE_OTHER): Payer: Medicare Other | Admitting: Internal Medicine

## 2015-12-10 ENCOUNTER — Encounter: Payer: Self-pay | Admitting: Internal Medicine

## 2015-12-10 VITALS — BP 116/76 | HR 87 | Temp 98.2°F | Ht 64.0 in | Wt 119.0 lb

## 2015-12-10 DIAGNOSIS — F411 Generalized anxiety disorder: Secondary | ICD-10-CM | POA: Diagnosis not present

## 2015-12-10 DIAGNOSIS — F329 Major depressive disorder, single episode, unspecified: Secondary | ICD-10-CM

## 2015-12-10 DIAGNOSIS — F32A Depression, unspecified: Secondary | ICD-10-CM

## 2015-12-10 MED ORDER — BUSPIRONE HCL 7.5 MG PO TABS: 7.5000 mg | ORAL_TABLET | Freq: Three times a day (TID) | ORAL | Status: DC | PRN

## 2015-12-10 NOTE — Progress Notes (Signed)
Subjective:    Patient ID: Erika Gibson, female    DOB: 10-13-1928, 81 y.o.   MRN: [id number]  HPI  Here to f/u anxiety, after last visit did not take wellbutrin or seek counseling as agreed;  Remains depressed but no SI or HI, but son now in rehab and not doing well for drug addiction, and this has seemed to worsen her anxiety.  Pt denies chest pain, increased sob or doe, wheezing, orthopnea, PND, increased LE swelling, palpitations, dizziness or syncope.  Pt denies new neurological symptoms such as new headache, or facial or extremity weakness or numbness  Does not want med tx that "takes 6 wks to work" and "nothing addictive but needs to take the edge off."  She recognizes the name buspar as her son has apparently did well at some point in the past with this. Past Medical History  Diagnosis Date  . Colitis, collagenous 2010  . RENAL CALCULUS   . HYPERTENSION   . HYPERLIPIDEMIA   . DEPRESSION   . ASTHMA   . Anxiety state, unspecified   . COLONIC POLYPS, HX OF 06/2002    adenomatous polyp  . Osteopenia    Past Surgical History  Procedure Laterality Date  . Tonsillectomy    . Dilation and curettage of uterus    . Ankle surgery      removed pins from ankle bone  . Breast surgery      Implants    reports that she has quit smoking. She does not have any smokeless tobacco history on file. She reports that she does not drink alcohol or use illicit drugs. family history includes Alzheimer's disease in her father, mother, and other; Breast cancer in her paternal aunt; Colon polyps in her brother; Heart disease in her father. Allergies  Allergen Reactions  . Mesalamine     REACTION: fuzzy headed and unsteady   Current Outpatient Prescriptions on File Prior to Visit  Medication Sig Dispense Refill  . atorvastatin (LIPITOR) 10 MG tablet Take 10 mg by mouth daily.      Marland Kitchen. lisinopril (PRINIVIL,ZESTRIL) 10 MG tablet Take 10 mg by mouth daily.      . Multiple Vitamin (MULTIVITAMIN) tablet  Take 1 tablet by mouth daily.      . Probiotic Product (PHILLIPS COLON HEALTH) CAPS Take by mouth daily.      Marland Kitchen. triamterene-hydrochlorothiazide (MAXZIDE-25) 37.5-25 MG per tablet Take 1 tablet by mouth daily.      Marland Kitchen. zolpidem (AMBIEN) 5 MG tablet Take 1 tablet (5 mg total) by mouth at bedtime as needed for sleep. 30 tablet 5   No current facility-administered medications on file prior to visit.   Review of Systems All otherwise neg per pt.       Objective:   Physical Exam BP 116/76 mmHg  Pulse 87  Temp(Src) 98.2 F (36.8 C) (Oral)  Ht 5\' 4"  (1.626 m)  Wt 119 lb (53.978 kg)  BMI 20.42 kg/m2  SpO2 96% VS noted,  Constitutional: Pt appears in no significant distress HENT: Head: NCAT.  Right Ear: External ear normal.  Left Ear: External ear normal.  Eyes: . Pupils are equal, round, and reactive to light. Conjunctivae and EOM are normal Neck: Normal range of motion. Neck supple.  Cardiovascular: Normal rate and regular rhythm.   Pulmonary/Chest: Effort normal and breath sounds without rales or wheezing. Neurological: Pt is alert. Not confused , motor grossly intact Skin: Skin is warm. No rash, no LE edema Psychiatric: Pt behavior  is normal. slight agitation. 2+ nervous    Assessment & Plan:

## 2015-12-10 NOTE — Assessment & Plan Note (Signed)
Declines further tx at this time, verified no SI or HI

## 2015-12-10 NOTE — Patient Instructions (Signed)
Please take all new medication as prescribed - the buspar low dose - sent to [facility]  Please continue all other medications as before, and refills have been done if requested.  Please have the pharmacy call with any other refills you may need.  Please keep your appointments with your specialists as you may have planned

## 2015-12-10 NOTE — Progress Notes (Signed)
Pre visit review using our clinic review tool, if applicable. No additional management support is needed unless otherwise documented below in the visit note. 

## 2015-12-10 NOTE — Assessment & Plan Note (Signed)
With recent situational worsening, and recent noncompliance with wellbutrin and counseling; for buspar low dose prn,  to f/u any worsening symptoms or concerns

## 2015-12-22 DIAGNOSIS — I10 Essential (primary) hypertension: Secondary | ICD-10-CM | POA: Diagnosis not present

## 2015-12-22 DIAGNOSIS — F411 Generalized anxiety disorder: Secondary | ICD-10-CM | POA: Diagnosis not present

## 2015-12-22 DIAGNOSIS — E782 Mixed hyperlipidemia: Secondary | ICD-10-CM | POA: Diagnosis not present

## 2015-12-22 DIAGNOSIS — F329 Major depressive disorder, single episode, unspecified: Secondary | ICD-10-CM | POA: Diagnosis not present

## 2015-12-25 DIAGNOSIS — Z961 Presence of intraocular lens: Secondary | ICD-10-CM | POA: Diagnosis not present

## 2015-12-25 DIAGNOSIS — H26493 Other secondary cataract, bilateral: Secondary | ICD-10-CM | POA: Diagnosis not present

## 2015-12-25 DIAGNOSIS — H524 Presbyopia: Secondary | ICD-10-CM | POA: Diagnosis not present

## 2015-12-25 DIAGNOSIS — H43813 Vitreous degeneration, bilateral: Secondary | ICD-10-CM | POA: Diagnosis not present

## 2015-12-30 DIAGNOSIS — F329 Major depressive disorder, single episode, unspecified: Secondary | ICD-10-CM | POA: Diagnosis not present

## 2015-12-30 DIAGNOSIS — F411 Generalized anxiety disorder: Secondary | ICD-10-CM | POA: Diagnosis not present

## 2015-12-30 DIAGNOSIS — I10 Essential (primary) hypertension: Secondary | ICD-10-CM | POA: Diagnosis not present

## 2016-01-04 ENCOUNTER — Ambulatory Visit (INDEPENDENT_AMBULATORY_CARE_PROVIDER_SITE_OTHER): Payer: Medicare Other | Admitting: Podiatry

## 2016-01-04 ENCOUNTER — Encounter: Payer: Self-pay | Admitting: Podiatry

## 2016-01-04 DIAGNOSIS — M79676 Pain in unspecified toe(s): Secondary | ICD-10-CM | POA: Diagnosis not present

## 2016-01-04 DIAGNOSIS — M2040 Other hammer toe(s) (acquired), unspecified foot: Secondary | ICD-10-CM | POA: Diagnosis not present

## 2016-01-04 DIAGNOSIS — B351 Tinea unguium: Secondary | ICD-10-CM | POA: Diagnosis not present

## 2016-01-04 NOTE — Progress Notes (Signed)
Patient ID: MADAI NUCCIO, female   DOB: [birth year], 82 y.o.   MRN: [id number]  Subjective: 82 y.o. female returns the office today for painful, elongated, thickened toenails which she is unable to trim herself. Denies any redness or drainage around the nails. She does have hammertoes which at times become painful. No recent injury or trauma. No tingling or numbness to the toes. Denies any acute changes since last appointment and no new complaints today. Denies any systemic complaints such as fevers, chills, nausea, vomiting.   Objective: AAO 3, NAD DP/PT pulses palpable, CRT less than 3 seconds Protective sensation intact with Simms Weinstein monofilament Nails hypertrophic, dystrophic, elongated, brittle, discolored 10. There is tenderness overlying the nails 1-5 bilaterally. There is no surrounding erythema or drainage along the nail sites. No open lesions or pre-ulcerative lesions are identified. Semirigid hammertoe contractures are present bilaterally. There is mild tenderness to patient about the dorsal PIPJ's the toes. No other areas of tenderness to bilateral lower extremities. No overlying edema, erythema, increased warmth. No pain with calf compression, swelling, warmth, erythema.  Assessment: Patient presents with symptomatic onychomycosis; hammertoes  Plan: -Treatment options including alternatives, risks, complications were discussed -Nails sharply debrided 10 without complication/bleeding. -Offloading pads were dispensed for hammertoes. -Discussed daily foot inspection. If there are any changes, to call the office immediately.  -Follow-up in 3 months or sooner if any problems are to arise. In the meantime, encouraged to call the office with any questions, concerns, changes symptoms.  Ovid Curd, DPM

## 2016-01-06 ENCOUNTER — Ambulatory Visit (INDEPENDENT_AMBULATORY_CARE_PROVIDER_SITE_OTHER): Payer: Medicare Other | Admitting: Internal Medicine

## 2016-01-06 ENCOUNTER — Encounter: Payer: Self-pay | Admitting: Internal Medicine

## 2016-01-06 VITALS — BP 140/88 | HR 82 | Temp 98.0°F | Resp 20 | Wt 124.0 lb

## 2016-01-06 DIAGNOSIS — I10 Essential (primary) hypertension: Secondary | ICD-10-CM | POA: Diagnosis not present

## 2016-01-06 DIAGNOSIS — F411 Generalized anxiety disorder: Secondary | ICD-10-CM

## 2016-01-06 DIAGNOSIS — E785 Hyperlipidemia, unspecified: Secondary | ICD-10-CM | POA: Diagnosis not present

## 2016-01-06 NOTE — Progress Notes (Signed)
Subjective:    Patient ID: Erika Gibson, female    DOB: 11/10/1928, 82 y.o.   MRN: [id number]  HPI  Here to f/u, went with a friend to another provider, who felt she needed med change for her BP according to patient to "lessen her risk for dementia."  Lisinopril increased to 40 mg, and maxide stopped.  Since then BP have been 160's/170/s over 70/80's., weight increased and now with new bipedal edema  No plans for f/u or BMP was mentioned per pt.   Pt denies chest pain, increased sob or doe, wheezing, orthopnea, PND, other increased LE swelling, palpitations, dizziness or syncope.  Pt denies new neurological symptoms such as new headache, or facial or extremity weakness or numbness  Pt denies polydipsia, polyuria.  Pt is not compfortable with the med changes due to worsening BP.   Trying to follow a lower chol diet and taking the lipitor without complaint.  Anxiety overall much improved over last visit - Denies worsening depressive symptoms, suicidal ideation, or panic BP Readings from Last 3 Encounters:  01/06/16 140/88  12/10/15 116/76  10/28/15 146/84   Wt Readings from Last 3 Encounters:  01/06/16 124 lb (56.246 kg)  12/10/15 119 lb (53.978 kg)  10/28/15 118 lb (53.524 kg)    Past Medical History  Diagnosis Date  . Colitis, collagenous 2010  . RENAL CALCULUS   . HYPERTENSION   . HYPERLIPIDEMIA   . DEPRESSION   . ASTHMA   . Anxiety state, unspecified   . COLONIC POLYPS, HX OF 06/2002    adenomatous polyp  . Osteopenia    Past Surgical History  Procedure Laterality Date  . Tonsillectomy    . Dilation and curettage of uterus    . Ankle surgery      removed pins from ankle bone  . Breast surgery      Implants    reports that she has quit smoking. She does not have any smokeless tobacco history on file. She reports that she does not drink alcohol or use illicit drugs. family history includes Alzheimer's disease in her father, mother, and other; Breast cancer in her paternal  aunt; Colon polyps in her brother; Heart disease in her father. Allergies  Allergen Reactions  . Mesalamine     REACTION: fuzzy headed and unsteady   Current Outpatient Prescriptions on File Prior to Visit  Medication Sig Dispense Refill  . atorvastatin (LIPITOR) 10 MG tablet Take 10 mg by mouth daily.      . busPIRone (BUSPAR) 7.5 MG tablet Take 1 tablet (7.5 mg total) by mouth 3 (three) times daily as needed. 90 tablet 2  . lisinopril (PRINIVIL,ZESTRIL) 10 MG tablet Take 10 mg by mouth daily.      . Multiple Vitamin (MULTIVITAMIN) tablet Take 1 tablet by mouth daily.      . Probiotic Product (PHILLIPS COLON HEALTH) CAPS Take by mouth daily.      Marland Kitchen triamterene-hydrochlorothiazide (MAXZIDE-25) 37.5-25 MG per tablet Take 1 tablet by mouth daily.      Marland Kitchen zolpidem (AMBIEN) 5 MG tablet Take 1 tablet (5 mg total) by mouth at bedtime as needed for sleep. 30 tablet 5   No current facility-administered medications on file prior to visit.   Review of Systems  Constitutional: Negative for unusual diaphoresis or night sweats HENT: Negative for ringing in ear or discharge Eyes: Negative for double vision or worsening visual disturbance.  Respiratory: Negative for choking and stridor.   Gastrointestinal: Negative for vomiting  or other signifcant bowel change Genitourinary: Negative for hematuria or change in urine volume.  Musculoskeletal: Negative for other MSK pain or swelling Skin: Negative for color change and worsening wound.  Neurological: Negative for tremors and numbness other than noted  Psychiatric/Behavioral: Negative for decreased concentration or agitation other than above       Objective:   Physical Exam BP 140/88 mmHg  Pulse 82  Temp(Src) 98 F (36.7 C) (Oral)  Resp 20  Wt 124 lb (56.246 kg)  SpO2 97% VS noted, not ill appearing Constitutional: Pt appears in no significant distress HENT: Head: NCAT.  Right Ear: External ear normal.  Left Ear: External ear normal.  Eyes:  . Pupils are equal, round, and reactive to light. Conjunctivae and EOM are normal Neck: Normal range of motion. Neck supple.  Cardiovascular: Normal rate and regular rhythm.   Pulmonary/Chest: Effort normal and breath sounds without rales or wheezing.  Abd:  Soft, NT, ND, + BS Neurological: Pt is alert. Not confused , motor grossly intact Skin: Skin is warm. No rash, + bipedal LE edema Psychiatric: Pt behavior is normal. No agitation.     Assessment & Plan:

## 2016-01-06 NOTE — Patient Instructions (Signed)
Ok to change the lisinopril back to the 10 mg  Ok to re-start the [facility] as you had previously  OK to stop the lisinopril 40 mg  Please continue all other medications as before, and refills have been done if requested.  Please have the pharmacy call with any other refills you may need.  Please continue your efforts at being more active, low cholesterol diet, and weight control.  Please keep your appointments with your specialists as you may have planned  Please go to the LAB in the Basement (turn left off the elevator) for the tests to be done tomorrow  You will be contacted by phone if any changes need to be made immediately.  Otherwise, you will receive a letter about your results with an explanation, but please check with MyChart first.  Please remember to sign up for MyChart if you have not done so, as this will be important to you in the future with finding out test results, communicating by private email, and scheduling acute appointments online when needed.  Please return in 6 months, or sooner if needed

## 2016-01-06 NOTE — Progress Notes (Signed)
Pre visit review using our clinic review tool, if applicable. No additional management support is needed unless otherwise documented below in the visit note. 

## 2016-01-07 ENCOUNTER — Encounter: Payer: Self-pay | Admitting: Internal Medicine

## 2016-01-07 ENCOUNTER — Other Ambulatory Visit (INDEPENDENT_AMBULATORY_CARE_PROVIDER_SITE_OTHER): Payer: Medicare Other

## 2016-01-07 DIAGNOSIS — I10 Essential (primary) hypertension: Secondary | ICD-10-CM | POA: Diagnosis not present

## 2016-01-07 LAB — BASIC METABOLIC PANEL
BUN: 17 mg/dL (ref 6–23)
CALCIUM: 9.6 mg/dL (ref 8.4–10.5)
CO2: 29 mEq/L (ref 19–32)
Chloride: 104 mEq/L (ref 96–112)
Creatinine, Ser: 1.04 mg/dL (ref 0.40–1.20)
GFR: 53.47 mL/min — AB (ref >60.00)
GLUCOSE: 90 mg/dL (ref 70–99)
Potassium: 3.6 mEq/L (ref 3.5–5.1)
SODIUM: 141 meq/L (ref 135–145)

## 2016-01-07 LAB — URINALYSIS, ROUTINE W REFLEX MICROSCOPIC
BILIRUBIN URINE: NEGATIVE
HGB URINE DIPSTICK: NEGATIVE
Ketones, ur: NEGATIVE
LEUKOCYTES UA: NEGATIVE
NITRITE: NEGATIVE
Specific Gravity, Urine: 1.02 (ref 1.000–1.030)
TOTAL PROTEIN, URINE-UPE24: NEGATIVE
URINE GLUCOSE: NEGATIVE
Urobilinogen, UA: 1 (ref 0.0–1.0)
pH: 6 (ref 5.0–8.0)

## 2016-01-07 LAB — CBC WITH DIFFERENTIAL/PLATELET
Basophils Absolute: 0 10*3/uL (ref 0.0–0.1)
Basophils Relative: 0.3 % (ref 0.0–3.0)
EOS PCT: 2.4 % (ref 0.0–5.0)
Eosinophils Absolute: 0.2 10*3/uL (ref 0.0–0.7)
HEMATOCRIT: 41.2 % (ref 36.0–46.0)
HEMOGLOBIN: 13.7 g/dL (ref 12.0–15.0)
Lymphocytes Relative: 17.5 % (ref 12.0–46.0)
Lymphs Abs: 1.2 10*3/uL (ref 0.7–4.0)
MCHC: 33.2 g/dL (ref 30.0–36.0)
MCV: 93.6 fl (ref 78.0–100.0)
MONOS PCT: 7.3 % (ref 3.0–12.0)
Monocytes Absolute: 0.5 10*3/uL (ref 0.1–1.0)
Neutro Abs: 4.9 10*3/uL (ref 1.4–7.7)
Neutrophils Relative %: 72.5 % (ref 43.0–77.0)
Platelets: 239 10*3/uL (ref 150.0–400.0)
RBC: 4.4 Mil/uL (ref 3.87–5.11)
RDW: 14.5 % (ref 11.5–15.5)
WBC: 6.8 10*3/uL (ref 4.0–10.5)

## 2016-01-07 LAB — TSH: TSH: 1.87 u[IU]/mL (ref 0.35–4.50)

## 2016-01-07 LAB — HEPATIC FUNCTION PANEL
ALBUMIN: 4.3 g/dL (ref 3.5–5.2)
ALT: 95 U/L — AB (ref 0–35)
AST: 49 U/L — AB (ref 0–37)
Alkaline Phosphatase: 70 U/L (ref 39–117)
Bilirubin, Direct: 0.2 mg/dL (ref 0.0–0.3)
TOTAL PROTEIN: 6.7 g/dL (ref 6.0–8.3)
Total Bilirubin: 1.1 mg/dL (ref 0.2–1.2)

## 2016-01-07 LAB — LIPID PANEL
Cholesterol: 165 mg/dL (ref 0–200)
HDL: 76 mg/dL (ref >39.00)
LDL Cholesterol: 76 mg/dL (ref 0–99)
NONHDL: 88.74
Total CHOL/HDL Ratio: 2
Triglycerides: 65 mg/dL (ref 0.0–149.0)
VLDL: 13 mg/dL (ref 0.0–40.0)

## 2016-01-07 NOTE — Assessment & Plan Note (Signed)
stable overall by history and exam, recent data reviewed with pt, and pt to continue medical treatment as before,  to f/u any worsening symptoms or concerns Lab Results  Component Value Date   LDLCALC 75 01/13/2015   For f/u labs

## 2016-01-07 NOTE — Assessment & Plan Note (Signed)
Overall improved, to cont same tx,  to f/u any worsening symptoms or concerns Lab Results  Component Value Date   WBC 7.0 02/17/2014   HGB 13.3 02/17/2014   HCT 41 02/17/2014   PLT 258 02/17/2014   GLUCOSE 80 01/13/2015   CHOL 173 01/13/2015   TRIG 139.0 01/13/2015   HDL 70.20 01/13/2015   LDLCALC 75 01/13/2015   ALT 24 02/17/2014   AST 27 02/17/2014   NA 141 01/13/2015   K 4.5 01/13/2015   CL 107 01/13/2015   CREATININE 1.28* 01/13/2015   BUN 35* 01/13/2015   CO2 28 01/13/2015   TSH 2.41 02/17/2014

## 2016-01-07 NOTE — Assessment & Plan Note (Signed)
Uncontrolled, and I am not aware of credible studies to indicate certain BP med can reduce risk of dementia, though I suppose the patient could have misunderstood.  OK to change back to lisinopril 10, and maxide 1 qd as she had previous good control.  Will plan on f/u labs as well to r/o worsening renal fxn with recent large ACEI dosing exposure.

## 2016-01-18 DIAGNOSIS — I10 Essential (primary) hypertension: Secondary | ICD-10-CM | POA: Diagnosis not present

## 2016-01-20 DIAGNOSIS — I10 Essential (primary) hypertension: Secondary | ICD-10-CM | POA: Diagnosis not present

## 2016-01-20 DIAGNOSIS — F329 Major depressive disorder, single episode, unspecified: Secondary | ICD-10-CM | POA: Diagnosis not present

## 2016-01-20 DIAGNOSIS — F411 Generalized anxiety disorder: Secondary | ICD-10-CM | POA: Diagnosis not present

## 2016-01-20 DIAGNOSIS — E782 Mixed hyperlipidemia: Secondary | ICD-10-CM | POA: Diagnosis not present

## 2016-03-01 ENCOUNTER — Other Ambulatory Visit: Payer: Self-pay | Admitting: Internal Medicine

## 2016-03-09 DIAGNOSIS — E7800 Pure hypercholesterolemia, unspecified: Secondary | ICD-10-CM | POA: Diagnosis not present

## 2016-03-09 DIAGNOSIS — E559 Vitamin D deficiency, unspecified: Secondary | ICD-10-CM | POA: Diagnosis not present

## 2016-03-09 DIAGNOSIS — I10 Essential (primary) hypertension: Secondary | ICD-10-CM | POA: Diagnosis not present

## 2016-03-09 DIAGNOSIS — F419 Anxiety disorder, unspecified: Secondary | ICD-10-CM | POA: Diagnosis not present

## 2016-03-09 DIAGNOSIS — R0789 Other chest pain: Secondary | ICD-10-CM | POA: Diagnosis not present

## 2016-03-09 DIAGNOSIS — R740 Nonspecific elevation of levels of transaminase and lactic acid dehydrogenase [LDH]: Secondary | ICD-10-CM | POA: Diagnosis not present

## 2016-03-10 NOTE — Progress Notes (Signed)
HPI: 82 year old female for evaluation of abnormal ECG. No prior cardiac history. Patient has mild dyspnea with extreme activities but not routine activities. No orthopnea or PND. Occasional minimal pedal edema. She does not have exertional chest pain or syncope. In February she had an episode of epigastric pain for 15 minutes. It did not radiate. No associated symptoms. She attributed this to higher dose of lisinopril. She has had no chest pain since and is quite active.  Current Outpatient Prescriptions  Medication Sig Dispense Refill  . atorvastatin (LIPITOR) 10 MG tablet Take 10 mg by mouth daily.      Marland Kitchen. lisinopril (PRINIVIL,ZESTRIL) 10 MG tablet Take 10 mg by mouth daily.      . Multiple Vitamin (MULTIVITAMIN) tablet Take 1 tablet by mouth daily.      . Probiotic Product (PHILLIPS COLON HEALTH) CAPS Take by mouth daily.      Marland Kitchen. triamterene-hydrochlorothiazide (MAXZIDE-25) 37.5-25 MG per tablet Take 1 tablet by mouth daily.      Marland Kitchen. zolpidem (AMBIEN) 5 MG tablet Take 1 tablet (5 mg total) by mouth at bedtime as needed for sleep. 30 tablet 5   No current facility-administered medications for this visit.    Allergies  Allergen Reactions  . Mesalamine     REACTION: fuzzy headed and unsteady     Past Medical History  Diagnosis Date  . Colitis, collagenous 2010  . Nephrolithiasis   . HYPERTENSION   . HYPERLIPIDEMIA   . DEPRESSION   . ASTHMA   . Anxiety state, unspecified   . COLONIC POLYPS, HX OF 06/2002    adenomatous polyp  . Osteopenia     Past Surgical History  Procedure Laterality Date  . Tonsillectomy    . Dilation and curettage of uterus    . Ankle surgery      removed pins from ankle bone  . Breast surgery      Implants    Social History   Social History  . Marital Status: Widowed    Spouse Name: N/A  . Number of Children: 2  . Years of Education: N/A   Occupational History  . Not on file.   Social History Main Topics  . Smoking status: Former  Games developermoker  . Smokeless tobacco: Not on file     Comment: Stopped 45 years ago  . Alcohol Use: No  . Drug Use: No  . Sexual Activity: Not on file   Other Topics Concern  . Not on file   Social History Narrative   Widowed 07/2014 (spouse with dementia for years prior to death)   Lives w/ alcoholic son    Family History  Problem Relation Age of Onset  . Alzheimer's disease Mother   . Heart disease Father   . Alzheimer's disease Father   . Colon polyps Brother   . Breast cancer Paternal Aunt   . Alzheimer's disease Other     ROS: Arthralgias and fatigue as well as occasional productive cough but no fevers or chills, hemoptysis, dysphasia, odynophagia, melena, hematochezia, dysuria, hematuria, rash, seizure activity, orthopnea, PND, claudication. Remaining systems are negative.  Physical Exam:   Blood pressure 160/68, pulse 80, height 5\' 4"  (1.626 m), weight 121 lb 3.2 oz (54.976 kg).  General:  Well developed/well nourished in NAD Skin warm/dry Patient not depressed No peripheral clubbing Back-normal HEENT-normal/normal eyelids Neck supple/normal carotid upstroke bilaterally; no bruits; no JVD; no thyromegaly chest - CTA/ normal expansion CV - RRR/normal S1 and S2; no rubs or  gallops;  PMI nondisplaced; 1/6 systolic murmur LSB Abdomen -NT/ND, no HSM, no mass, + bowel sounds, no bruit 2+ femoral pulses, no bruits Ext-no edema, chords, 2+ DP Neuro-grossly nonfocal  ECG Electrocardiogram 03/09/2016 showed sinus rhythm at a rate of 90. Normal axis. Biatrial enlargement. Left ventricular hypertrophy and prior septal infarct cannot be excluded.

## 2016-03-11 ENCOUNTER — Ambulatory Visit (INDEPENDENT_AMBULATORY_CARE_PROVIDER_SITE_OTHER): Payer: Medicare Other | Admitting: Cardiology

## 2016-03-11 ENCOUNTER — Encounter: Payer: Self-pay | Admitting: Cardiology

## 2016-03-11 VITALS — BP 160/68 | HR 80 | Ht 64.0 in | Wt 121.2 lb

## 2016-03-11 DIAGNOSIS — I10 Essential (primary) hypertension: Secondary | ICD-10-CM | POA: Diagnosis not present

## 2016-03-11 DIAGNOSIS — R9431 Abnormal electrocardiogram [ECG] [EKG]: Secondary | ICD-10-CM | POA: Diagnosis not present

## 2016-03-11 DIAGNOSIS — R079 Chest pain, unspecified: Secondary | ICD-10-CM | POA: Insufficient documentation

## 2016-03-11 DIAGNOSIS — R072 Precordial pain: Secondary | ICD-10-CM | POA: Diagnosis not present

## 2016-03-11 NOTE — Patient Instructions (Signed)
Medication Instructions:  NO CHANGES.    Labwork: NONE  Testing/Procedures: Your physician has requested that you have an echocardiogram. Echocardiography is a painless test that uses sound waves to create images of your heart. It provides your doctor with information about the size and shape of your heart and how well your heart's chambers and valves are working. This procedure takes approximately one hour. There are no restrictions for this procedure.    Follow-Up: Your physician recommends that you schedule a follow-up appointment AS NEEDED, PENDING TEST RESULT.   Any Other Special Instructions Will Be Listed Below (If Applicable).     If you need a refill on your cardiac medications before your next appointment, please call your pharmacy.

## 2016-03-11 NOTE — Assessment & Plan Note (Signed)
Patient had 15 minutes of epigastric pain in February. However she has had no further symptoms and is quite active. She does not have exertional chest pain. If echocardiogram shows normal wall motion would not pursue further ischemia evaluation unless she has recurrent symptoms.

## 2016-03-11 NOTE — Assessment & Plan Note (Signed)
Patient's electrocardiogram shows sinus rhythm, biatrial enlargement, left ventricular hypertrophy and prior septal infarct cannot be excluded. She is not having significant cardiac symptoms. We will arrange an echocardiogram to assess atrial size, left ventricular wall thickness, wall motion abnormality.

## 2016-03-11 NOTE — Assessment & Plan Note (Signed)
Blood pressure is mildly elevated this morning. However she follows this at home and is typically controlled on present medications. Continue to monitor and advance lisinopril if needed.

## 2016-03-18 ENCOUNTER — Other Ambulatory Visit (HOSPITAL_COMMUNITY): Payer: Self-pay

## 2016-03-23 DIAGNOSIS — R7301 Impaired fasting glucose: Secondary | ICD-10-CM | POA: Diagnosis not present

## 2016-03-23 DIAGNOSIS — F411 Generalized anxiety disorder: Secondary | ICD-10-CM | POA: Diagnosis not present

## 2016-03-23 DIAGNOSIS — I10 Essential (primary) hypertension: Secondary | ICD-10-CM | POA: Diagnosis not present

## 2016-03-23 DIAGNOSIS — E559 Vitamin D deficiency, unspecified: Secondary | ICD-10-CM | POA: Diagnosis not present

## 2016-03-23 DIAGNOSIS — F419 Anxiety disorder, unspecified: Secondary | ICD-10-CM | POA: Diagnosis not present

## 2016-03-31 ENCOUNTER — Ambulatory Visit (HOSPITAL_COMMUNITY): Payer: Medicare Other | Attending: Cardiology

## 2016-03-31 ENCOUNTER — Other Ambulatory Visit: Payer: Self-pay

## 2016-03-31 DIAGNOSIS — I071 Rheumatic tricuspid insufficiency: Secondary | ICD-10-CM | POA: Diagnosis not present

## 2016-03-31 DIAGNOSIS — I340 Nonrheumatic mitral (valve) insufficiency: Secondary | ICD-10-CM | POA: Insufficient documentation

## 2016-03-31 DIAGNOSIS — I119 Hypertensive heart disease without heart failure: Secondary | ICD-10-CM | POA: Diagnosis not present

## 2016-03-31 DIAGNOSIS — R9431 Abnormal electrocardiogram [ECG] [EKG]: Secondary | ICD-10-CM

## 2016-03-31 DIAGNOSIS — I351 Nonrheumatic aortic (valve) insufficiency: Secondary | ICD-10-CM | POA: Diagnosis not present

## 2016-04-05 ENCOUNTER — Encounter: Payer: Self-pay | Admitting: Podiatry

## 2016-04-05 ENCOUNTER — Ambulatory Visit (INDEPENDENT_AMBULATORY_CARE_PROVIDER_SITE_OTHER): Payer: Medicare Other | Admitting: Podiatry

## 2016-04-05 DIAGNOSIS — M79676 Pain in unspecified toe(s): Secondary | ICD-10-CM | POA: Diagnosis not present

## 2016-04-05 DIAGNOSIS — B351 Tinea unguium: Secondary | ICD-10-CM | POA: Diagnosis not present

## 2016-04-05 NOTE — Progress Notes (Signed)
Patient ID: Erika Gibson, female   DOB: 04/02/1928, 82 y.o.   MRN: [id number] Complaint:  Visit Type: Patient returns to my office for continued preventative foot care services. Complaint: Patient states" my nails have grown long and thick and become painful to walk and wear shoes" . The patient presents for preventative foot care services. No changes to ROS  Podiatric Exam: Vascular: dorsalis pedis and posterior tibial pulses are palpable bilateral. Capillary return is immediate. Temperature gradient is WNL. Skin turgor WNL . Varicosities noted feet B/L Sensorium: Normal Semmes Weinstein monofilament test. Normal tactile sensation bilaterally. Nail Exam: Pt has thick disfigured discolored nails with subungual debris noted bilateral entire nail hallux through fifth toenails Ulcer Exam: There is no evidence of ulcer or pre-ulcerative changes or infection. Orthopedic Exam: Muscle tone and strength are WNL. No limitations in general ROM. No crepitus or effusions noted. Foot type and digits show no abnormalities. Bony prominences are unremarkable. Skin: No Porokeratosis. No infection or ulcers  Diagnosis:  Onychomycosis, , Pain in right toe, pain in left toes  Treatment & Plan Procedures and Treatment: Consent by patient was obtained for treatment procedures. The patient understood the discussion of treatment and procedures well. All questions were answered thoroughly reviewed. Debridement of mycotic and hypertrophic toenails, 1 through 5 bilateral and clearing of subungual debris. No ulceration, no infection noted.  Return Visit-Office Procedure: Patient instructed to return to the office for a follow up visit 3 months for continued evaluation and treatment.    Conrad Zajkowski DPM 

## 2016-04-13 ENCOUNTER — Other Ambulatory Visit: Payer: Self-pay | Admitting: Internal Medicine

## 2016-04-13 ENCOUNTER — Ambulatory Visit
Admission: RE | Admit: 2016-04-13 | Discharge: 2016-04-13 | Disposition: A | Discharge status: Home / Self Care | Payer: Medicare Other | Source: Ambulatory Visit | Attending: Internal Medicine | Admitting: Internal Medicine

## 2016-04-13 DIAGNOSIS — M25552 Pain in left hip: Principal | ICD-10-CM

## 2016-04-13 DIAGNOSIS — M160 Bilateral primary osteoarthritis of hip: Secondary | ICD-10-CM | POA: Diagnosis not present

## 2016-04-13 DIAGNOSIS — F411 Generalized anxiety disorder: Secondary | ICD-10-CM | POA: Diagnosis not present

## 2016-04-13 DIAGNOSIS — M25551 Pain in right hip: Secondary | ICD-10-CM

## 2016-04-13 DIAGNOSIS — R7301 Impaired fasting glucose: Secondary | ICD-10-CM | POA: Diagnosis not present

## 2016-04-13 DIAGNOSIS — R0789 Other chest pain: Secondary | ICD-10-CM | POA: Diagnosis not present

## 2016-04-14 ENCOUNTER — Telehealth: Payer: Self-pay | Admitting: Cardiology

## 2016-04-14 NOTE — Telephone Encounter (Signed)
Will defer to Dr. Jens Somrenshaw for any recommendations based on echo findings.

## 2016-04-14 NOTE — Telephone Encounter (Signed)
Pt is calling in to get the results to the Echo she had done on 4/20. Please f/u with her  Thanks

## 2016-04-14 NOTE — Telephone Encounter (Signed)
Echo shows normal LV function; no further cardiac eval unless she has further symptoms. Erika MillersBrian Aidenjames Gibson

## 2016-04-15 NOTE — Telephone Encounter (Signed)
Left message of results for pt  

## 2016-06-01 DIAGNOSIS — Z0000 Encounter for general adult medical examination without abnormal findings: Secondary | ICD-10-CM | POA: Diagnosis not present

## 2016-06-01 DIAGNOSIS — Z1389 Encounter for screening for other disorder: Secondary | ICD-10-CM | POA: Diagnosis not present

## 2016-06-13 DIAGNOSIS — Z1211 Encounter for screening for malignant neoplasm of colon: Secondary | ICD-10-CM | POA: Diagnosis not present

## 2016-06-15 DIAGNOSIS — F5102 Adjustment insomnia: Secondary | ICD-10-CM | POA: Diagnosis not present

## 2016-06-15 DIAGNOSIS — R0609 Other forms of dyspnea: Secondary | ICD-10-CM | POA: Diagnosis not present

## 2016-06-15 DIAGNOSIS — F4321 Adjustment disorder with depressed mood: Secondary | ICD-10-CM | POA: Diagnosis not present

## 2016-07-05 ENCOUNTER — Ambulatory Visit (INDEPENDENT_AMBULATORY_CARE_PROVIDER_SITE_OTHER): Payer: Medicare Other | Admitting: Podiatry

## 2016-07-05 ENCOUNTER — Encounter: Payer: Self-pay | Admitting: Podiatry

## 2016-07-05 ENCOUNTER — Encounter: Payer: Medicare Other | Admitting: Internal Medicine

## 2016-07-05 DIAGNOSIS — B351 Tinea unguium: Secondary | ICD-10-CM | POA: Diagnosis not present

## 2016-07-05 DIAGNOSIS — M79676 Pain in unspecified toe(s): Secondary | ICD-10-CM | POA: Diagnosis not present

## 2016-07-05 NOTE — Progress Notes (Signed)
Patient ID: Erika Gibson, female   DOB: Jul 20, 1928, 82 y.o.   MRN: [id number] Complaint:  Visit Type: Patient returns to my office for continued preventative foot care services. Complaint: Patient states" my nails have grown long and thick and become painful to walk and wear shoes" . The patient presents for preventative foot care services. No changes to ROS  Podiatric Exam: Vascular: dorsalis pedis and posterior tibial pulses are palpable bilateral. Capillary return is immediate. Temperature gradient is WNL. Skin turgor WNL . Varicosities noted feet B/L Sensorium: Normal Semmes Weinstein monofilament test. Normal tactile sensation bilaterally. Nail Exam: Pt has thick disfigured discolored nails with subungual debris noted bilateral entire nail hallux through fifth toenails Ulcer Exam: There is no evidence of ulcer or pre-ulcerative changes or infection. Orthopedic Exam: Muscle tone and strength are WNL. No limitations in general ROM. No crepitus or effusions noted. Foot type and digits show no abnormalities. Bony prominences are unremarkable. Skin: No Porokeratosis. No infection or ulcers  Diagnosis:  Onychomycosis, , Pain in right toe, pain in left toes  Treatment & Plan Procedures and Treatment: Consent by patient was obtained for treatment procedures. The patient understood the discussion of treatment and procedures well. All questions were answered thoroughly reviewed. Debridement of mycotic and hypertrophic toenails, 1 through 5 bilateral and clearing of subungual debris. No ulceration, no infection noted.  Return Visit-Office Procedure: Patient instructed to return to the office for a follow up visit 3 months for continued evaluation and treatment.    Helane Gunther DPM

## 2016-08-05 NOTE — Progress Notes (Signed)
      HPI: FU abnormal ECG. Echo 4/17 showed normal LV function, grade 1 diastolic dysfunction, trace AI, mild MR. Since last seen, She has dyspnea with more moderate activities but not routine activities. No orthopnea, PND, pedal edema, chest pain or syncope.  Current Outpatient Prescriptions  Medication Sig Dispense Refill  . atorvastatin (LIPITOR) 10 MG tablet Take 10 mg by mouth daily.      Marland Kitchen. lisinopril (PRINIVIL,ZESTRIL) 10 MG tablet Take 10 mg by mouth daily.      . Multiple Vitamin (MULTIVITAMIN) tablet Take 1 tablet by mouth daily.      Marland Kitchen. triamterene-hydrochlorothiazide (MAXZIDE-25) 37.5-25 MG per tablet Take 1 tablet by mouth daily.       No current facility-administered medications for this visit.      Past Medical History:  Diagnosis Date  . Anxiety state, unspecified   . ASTHMA   . Colitis, collagenous 2010  . COLONIC POLYPS, HX OF 06/2002   adenomatous polyp  . DEPRESSION   . HYPERLIPIDEMIA   . HYPERTENSION   . Nephrolithiasis   . Osteopenia     Past Surgical History:  Procedure Laterality Date  . ANKLE SURGERY     removed pins from ankle bone  . BREAST SURGERY     Implants  . DILATION AND CURETTAGE OF UTERUS    . TONSILLECTOMY      Social History   Social History  . Marital status: Widowed    Spouse name: N/A  . Number of children: 2  . Years of education: N/A   Occupational History  . Not on file.   Social History Main Topics  . Smoking status: Former Games developermoker  . Smokeless tobacco: Never Used     Comment: Stopped 45 years ago  . Alcohol use No  . Drug use: No  . Sexual activity: Not on file   Other Topics Concern  . Not on file   Social History Narrative   Widowed 07/2014 (spouse with dementia for years prior to death)   Lives w/ alcoholic son    Family History  Problem Relation Age of Onset  . Alzheimer's disease Mother   . Heart disease Father   . Alzheimer's disease Father   . Colon polyps Brother   . Breast cancer Paternal Aunt    . Alzheimer's disease Other     ROS: no fevers or chills, productive cough, hemoptysis, dysphasia, odynophagia, melena, hematochezia, dysuria, hematuria, rash, seizure activity, orthopnea, PND, pedal edema, claudication. Remaining systems are negative.  Physical Exam: Well-developed well-nourished in no acute distress.  Skin is warm and dry.  HEENT is normal.  Neck is supple.  Chest is clear to auscultation with normal expansion.  Cardiovascular exam is regular rate and rhythm.  Abdominal exam nontender or distended. No masses palpated. Extremities show no edema. neuro grossly intact   A/P  Electrocardiogram shows sinus rhythm at a rate of 78. Prior septal infarct cannot be excluded. Nonspecific ST changes.  1 Abnormal electrocardiogram-previous echocardiogram showed normal LV function. No significant symptoms. We will not pursue further cardiac evaluation.  2 chest pain-patient experienced an episode of epigastric discomfort in February but has had no symptoms since. Echocardiogram shows normal LV function. No plans for further ischemia evaluation.  3 hypertension-Blood pressure is mildly elevated. Continue present medications and follow at home. If it increases we will increase lisinopril further.  Olga MillersBrian Crenshaw, MD

## 2016-08-08 ENCOUNTER — Ambulatory Visit (INDEPENDENT_AMBULATORY_CARE_PROVIDER_SITE_OTHER): Payer: Medicare Other | Admitting: Cardiology

## 2016-08-08 ENCOUNTER — Encounter: Payer: Self-pay | Admitting: Cardiology

## 2016-08-08 VITALS — BP 146/74 | HR 78 | Ht 64.0 in | Wt 119.0 lb

## 2016-08-08 DIAGNOSIS — R9431 Abnormal electrocardiogram [ECG] [EKG]: Secondary | ICD-10-CM | POA: Diagnosis not present

## 2016-08-08 DIAGNOSIS — R072 Precordial pain: Secondary | ICD-10-CM | POA: Diagnosis not present

## 2016-08-08 DIAGNOSIS — I10 Essential (primary) hypertension: Secondary | ICD-10-CM

## 2016-08-08 IMAGING — CR DG HIP (WITH OR WITHOUT PELVIS) 3-4V BILAT
3 series · 3 of 3 positions shown · non-contrast
Comparison: None

CLINICAL DATA: Chronic pain in both hips for 1 year, history
hypertension, asthma, nephrolithiasis, collagenous colitis

EXAM:
DG HIP (WITH OR WITHOUT PELVIS) 3-4V BILAT

[w pelvis *]
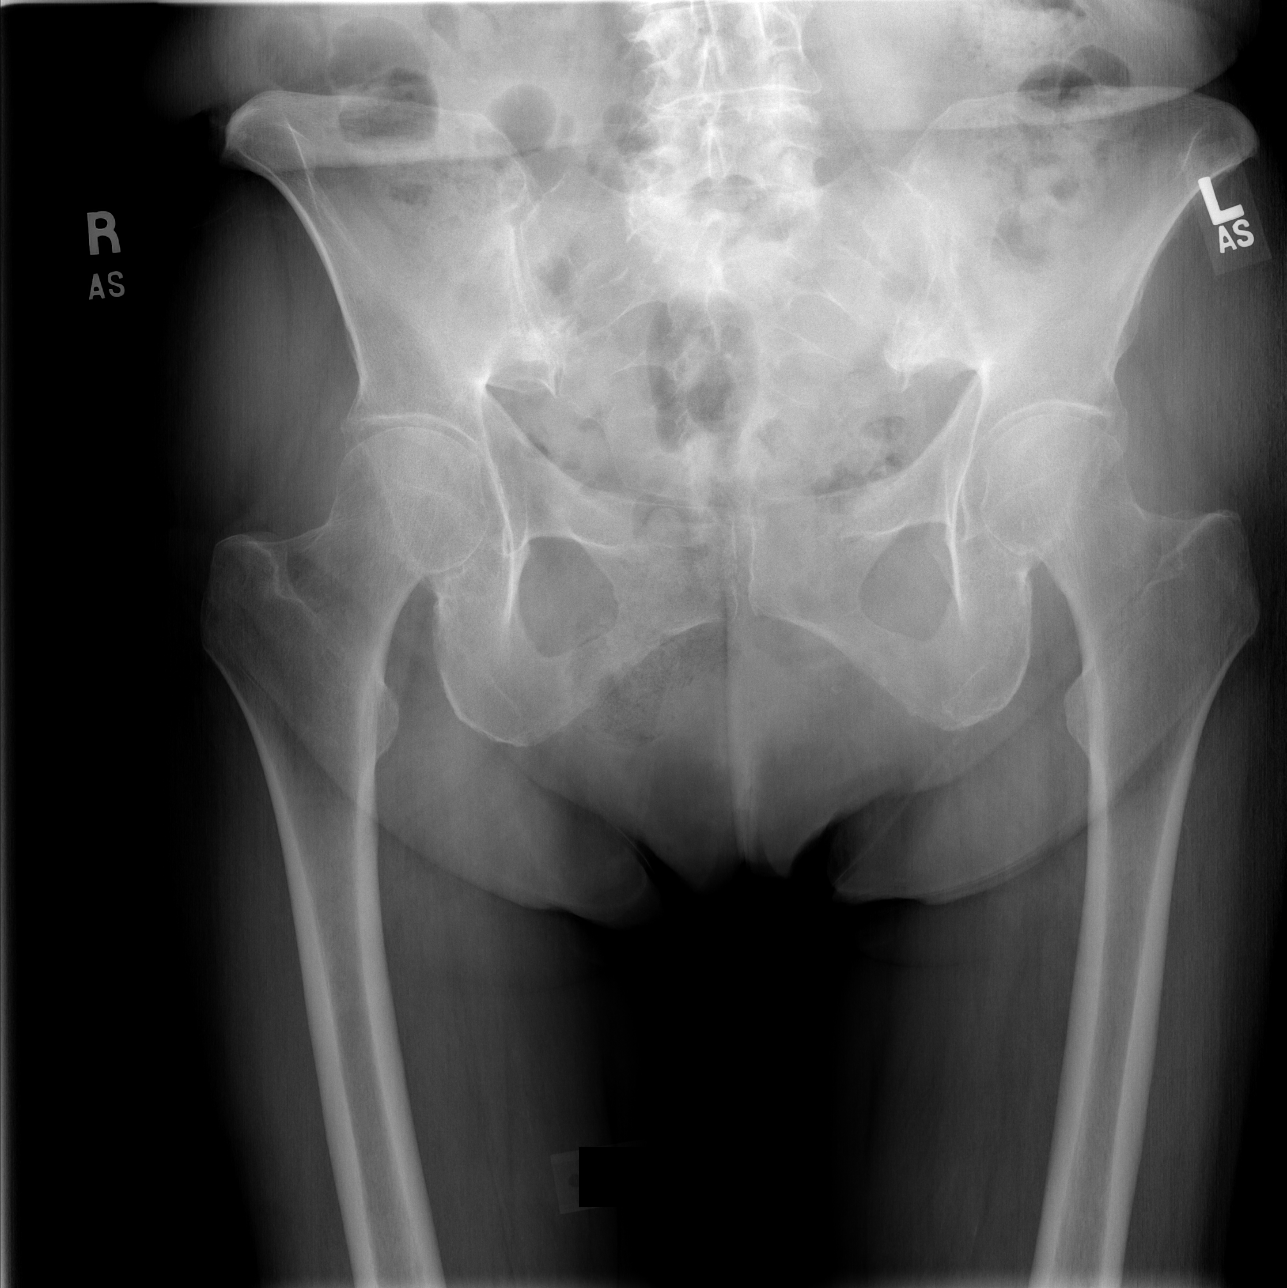

[t hip frog leg right]
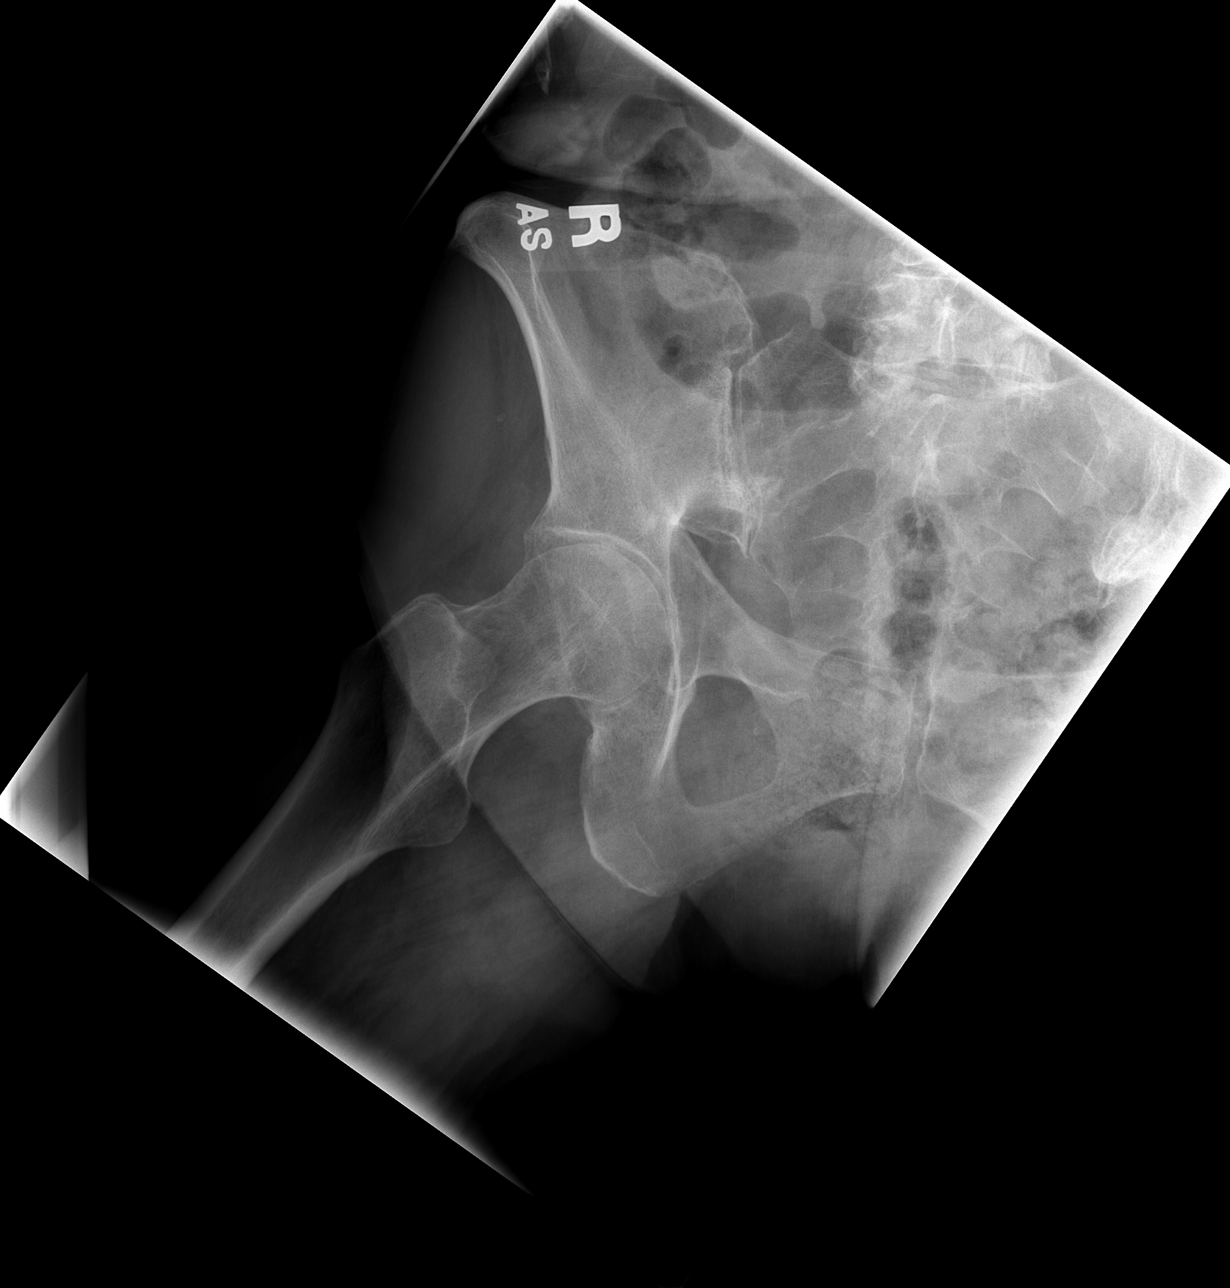

[t hip frog leg left]
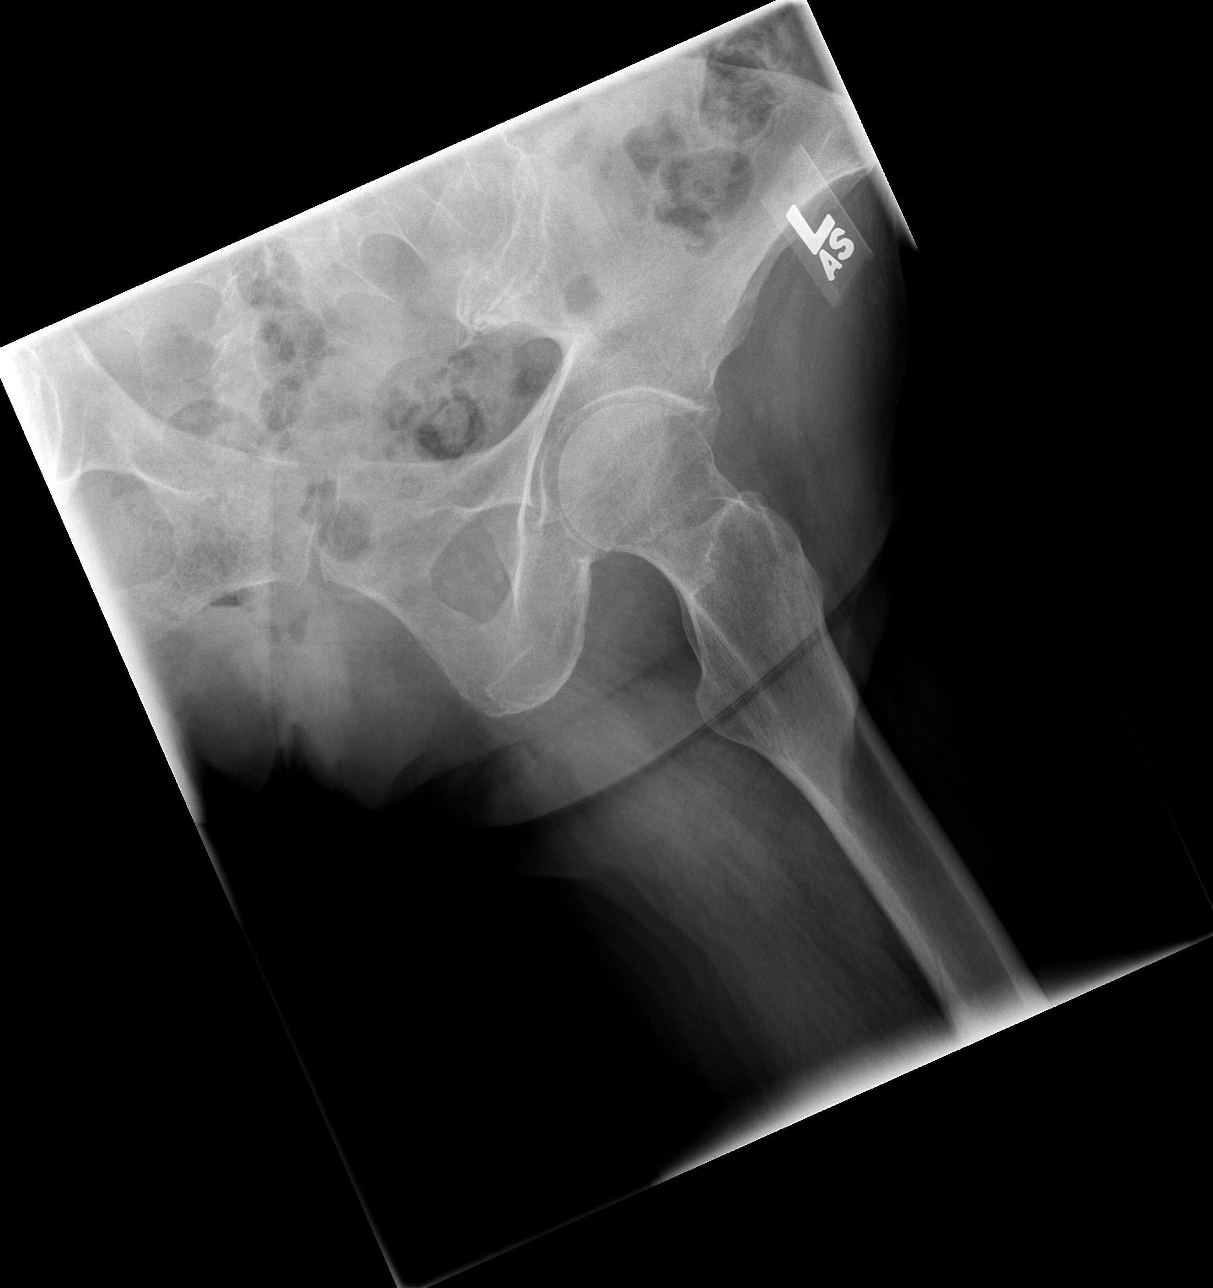

[3 of 3 positions shown; findings below may reference images not displayed]

FINDINGS: Osseous demineralization.

BILATERAL hip joint space narrowing.

SI joints fairly well preserved.

No acute fracture, dislocation or bone destruction.
IMPRESSION: Osseous demineralization with degenerative changes of both hip
joints.

## 2016-08-08 NOTE — Patient Instructions (Signed)
Medication Instructions:   NO CHANGE  Follow-Up: Your physician wants you to follow-up in: 6 MONTHS WITH DR CRENSHAW. You will receive a reminder letter in the mail two months in advance. If you don't receive a letter, please call our office to schedule the follow-up appointment.  If you need a refill on your cardiac medications before your next appointment, please call your pharmacy.   

## 2016-08-16 ENCOUNTER — Ambulatory Visit (INDEPENDENT_AMBULATORY_CARE_PROVIDER_SITE_OTHER): Payer: Medicare Other | Admitting: Psychology

## 2016-08-16 DIAGNOSIS — F4323 Adjustment disorder with mixed anxiety and depressed mood: Secondary | ICD-10-CM

## 2016-09-06 ENCOUNTER — Ambulatory Visit: Payer: Medicare Other | Admitting: Psychology

## 2016-09-07 DIAGNOSIS — Z23 Encounter for immunization: Secondary | ICD-10-CM | POA: Diagnosis not present

## 2016-10-04 ENCOUNTER — Encounter: Payer: Self-pay | Admitting: Podiatry

## 2016-10-04 ENCOUNTER — Ambulatory Visit (INDEPENDENT_AMBULATORY_CARE_PROVIDER_SITE_OTHER): Payer: Medicare Other | Admitting: Podiatry

## 2016-10-04 VITALS — BP 136/75 | HR 75 | Resp 14 | Ht 64.0 in | Wt 119.0 lb

## 2016-10-04 DIAGNOSIS — B351 Tinea unguium: Secondary | ICD-10-CM

## 2016-10-04 DIAGNOSIS — M79676 Pain in unspecified toe(s): Secondary | ICD-10-CM | POA: Diagnosis not present

## 2016-10-04 NOTE — Progress Notes (Signed)
Patient ID: Erika GashBetty J Assefa, female   DOB: 1928/04/29, 82 y.o.   MRN: [id number] Complaint:  Visit Type: Patient returns to my office for continued preventative foot care services. Complaint: Patient states" my nails have grown long and thick and become painful to walk and wear shoes" . The patient presents for preventative foot care services. No changes to ROS  Podiatric Exam: Vascular: dorsalis pedis and posterior tibial pulses are palpable bilateral. Capillary return is immediate. Temperature gradient is WNL. Skin turgor WNL . Varicosities noted feet B/L Sensorium: Normal Semmes Weinstein monofilament test. Normal tactile sensation bilaterally. Nail Exam: Pt has thick disfigured discolored nails with subungual debris noted bilateral entire nail hallux through fifth toenails Ulcer Exam: There is no evidence of ulcer or pre-ulcerative changes or infection. Orthopedic Exam: Muscle tone and strength are WNL. No limitations in general ROM. No crepitus or effusions noted. Foot type and digits show no abnormalities. Bony prominences are unremarkable. Skin: No Porokeratosis. No infection or ulcers  Diagnosis:  Onychomycosis, , Pain in right toe, pain in left toes  Treatment & Plan Procedures and Treatment: Consent by patient was obtained for treatment procedures. The patient understood the discussion of treatment and procedures well. All questions were answered thoroughly reviewed. Debridement of mycotic and hypertrophic toenails, 1 through 5 bilateral and clearing of subungual debris. No ulceration, no infection noted.  Return Visit-Office Procedure: Patient instructed to return to the office for a follow up visit 3 months for continued evaluation and treatment.    Helane GuntherGregory Lenisha Lacap DPM

## 2016-12-31 DIAGNOSIS — Z803 Family history of malignant neoplasm of breast: Secondary | ICD-10-CM | POA: Diagnosis not present

## 2016-12-31 DIAGNOSIS — Z1231 Encounter for screening mammogram for malignant neoplasm of breast: Secondary | ICD-10-CM | POA: Diagnosis not present

## 2017-01-06 ENCOUNTER — Encounter: Payer: Self-pay | Admitting: Podiatry

## 2017-01-06 ENCOUNTER — Ambulatory Visit (INDEPENDENT_AMBULATORY_CARE_PROVIDER_SITE_OTHER): Payer: Medicare Other | Admitting: Podiatry

## 2017-01-06 VITALS — Ht 64.0 in | Wt 119.0 lb

## 2017-01-06 DIAGNOSIS — Q828 Other specified congenital malformations of skin: Secondary | ICD-10-CM

## 2017-01-06 DIAGNOSIS — B351 Tinea unguium: Secondary | ICD-10-CM | POA: Diagnosis not present

## 2017-01-06 DIAGNOSIS — M79676 Pain in unspecified toe(s): Secondary | ICD-10-CM | POA: Diagnosis not present

## 2017-01-06 NOTE — Progress Notes (Signed)
Patient ID: Erika GashBetty J Gibson, female   DOB: [birth year], 83 y.o.   MRN: [id number] Complaint:  Visit Type: Patient returns to my office for continued preventative foot care services. Complaint: Patient states" my nails have grown long and thick and become painful to walk and wear shoes" . The patient presents for preventative foot care services. No changes to ROS  Podiatric Exam: Vascular: dorsalis pedis and posterior tibial pulses are palpable bilateral. Capillary return is immediate. Temperature gradient is WNL. Skin turgor WNL . Varicosities noted feet B/L Sensorium: Normal Semmes Weinstein monofilament test. Normal tactile sensation bilaterally. Nail Exam: Pt has thick disfigured discolored nails with subungual debris noted bilateral entire nail hallux through fifth toenails Ulcer Exam: There is no evidence of ulcer or pre-ulcerative changes or infection. Orthopedic Exam: Muscle tone and strength are WNL. No limitations in general ROM. No crepitus or effusions noted. Foot type and digits show no abnormalities. Bony prominences are unremarkable. Skin:  Porokeratosis sub 3 right No infection or ulcers  Diagnosis:  Onychomycosis, , Pain in right toe, pain in left toes  Treatment & Plan Procedures and Treatment: Consent by patient was obtained for treatment procedures. The patient understood the discussion of treatment and procedures well. All questions were answered thoroughly reviewed. Debridement of mycotic and hypertrophic toenails, 1 through 5 bilateral and clearing of subungual debris. No ulceration, no infection noted. Dispensed to padding for prominent PIPJ second toe right.  Debride porokeratosis. Return Visit-Office Procedure: Patient instructed to return to the office for a follow up visit 3 months for continued evaluation and treatment.    Helane GuntherGregory Polina Burmaster DPM

## 2017-01-09 NOTE — Progress Notes (Signed)
HPI: FU abnormal ECG and CP. Echo 4/17 showed normal LV function, grade 1 diastolic dysfunction, trace AI, mild MR. Since last seen, she notes dyspnea with more vigorous activities but not routine activities. No orthopnea, PND, pedal edema, exertional chest pain or syncope. Occasional orthostatic symptoms. Note recently her blood pressure medications were changed to lisinopril 40 mg daily without diuretic and her blood pressure increased. She has resumed her previous regimen and her systolic typically runs 140.  Current Outpatient Prescriptions  Medication Sig Dispense Refill  . atorvastatin (LIPITOR) 10 MG tablet Take 10 mg by mouth daily.      Marland Kitchen lisinopril (PRINIVIL,ZESTRIL) 10 MG tablet Take 10 mg by mouth daily.      . Multiple Vitamin (MULTIVITAMIN) tablet Take 1 tablet by mouth daily.      . Probiotic Product (PROBIOTIC DAILY PO) Take 1 tablet by mouth daily.    Marland Kitchen triamterene-hydrochlorothiazide (MAXZIDE-25) 37.5-25 MG per tablet Take 1 tablet by mouth daily.       No current facility-administered medications for this visit.      Past Medical History:  Diagnosis Date  . Anxiety state, unspecified   . ASTHMA   . Colitis, collagenous 2010  . COLONIC POLYPS, HX OF 06/2002   adenomatous polyp  . DEPRESSION   . HYPERLIPIDEMIA   . HYPERTENSION   . Nephrolithiasis   . Osteopenia     Past Surgical History:  Procedure Laterality Date  . ANKLE SURGERY     removed pins from ankle bone  . BREAST SURGERY     Implants  . DILATION AND CURETTAGE OF UTERUS    . TONSILLECTOMY      Social History   Social History  . Marital status: Widowed    Spouse name: N/A  . Number of children: 2  . Years of education: N/A   Occupational History  . Not on file.   Social History Main Topics  . Smoking status: Former Games developer  . Smokeless tobacco: Never Used     Comment: Stopped 45 years ago  . Alcohol use No  . Drug use: No  . Sexual activity: Not on file   Other Topics Concern    . Not on file   Social History Narrative   Widowed 07/2014 (spouse with dementia for years prior to death)   Lives w/ alcoholic son    Family History  Problem Relation Age of Onset  . Alzheimer's disease Mother   . Heart disease Father   . Alzheimer's disease Father   . Colon polyps Brother   . Breast cancer Paternal Aunt   . Alzheimer's disease Other     ROS: no fevers or chills, productive cough, hemoptysis, dysphasia, odynophagia, melena, hematochezia, dysuria, hematuria, rash, seizure activity, orthopnea, PND, pedal edema, claudication. Remaining systems are negative.  Physical Exam: Well-developed thin in no acute distress.  Skin is warm and dry.  HEENT is normal.  Neck is supple. No bruits Chest is clear to auscultation with normal expansion.  Cardiovascular exam is regular rate and rhythm.  Abdominal exam nontender or distended. No masses palpated. Extremities show no edema. neuro grossly intact  ECG-sinus tachycardia at a rate of 117. Occasional PVC. Nonspecific ST changes.  A/P  1 hypertension-blood pressure was elevated when her regimen was changed. Her systolic now runs approximately 140 on lisinopril 10 and maxzide. She also occasionally has some dizziness with standing. I am therefore hesitant to advance her lisinopril. She will follow and we will  adjust regimen as needed.  2 Abnormal ECG-previous echocardiogram showed normal LV function. No plans for further evaluation at this point.   3 Chest pain- patient has not had further chest pain. She does not have exertional chest pain. We will not pursue further ischemia evaluation.  Olga MillersBrian Crenshaw, MD

## 2017-01-16 ENCOUNTER — Ambulatory Visit (INDEPENDENT_AMBULATORY_CARE_PROVIDER_SITE_OTHER): Payer: Medicare Other | Admitting: Cardiology

## 2017-01-16 ENCOUNTER — Encounter: Payer: Self-pay | Admitting: Cardiology

## 2017-01-16 VITALS — BP 136/73 | HR 117 | Ht 62.0 in | Wt 116.0 lb

## 2017-01-16 DIAGNOSIS — R9431 Abnormal electrocardiogram [ECG] [EKG]: Secondary | ICD-10-CM | POA: Diagnosis not present

## 2017-01-16 DIAGNOSIS — I10 Essential (primary) hypertension: Secondary | ICD-10-CM

## 2017-01-16 NOTE — Patient Instructions (Signed)
Your physician wants you to follow-up in: 6 MONTHS WITH DR CRENSHAW You will receive a reminder letter in the mail two months in advance. If you don't receive a letter, please call our office to schedule the follow-up appointment.   If you need a refill on your cardiac medications before your next appointment, please call your pharmacy.  

## 2017-02-03 ENCOUNTER — Telehealth: Payer: Self-pay | Admitting: Emergency Medicine

## 2017-02-03 DIAGNOSIS — Z961 Presence of intraocular lens: Secondary | ICD-10-CM | POA: Diagnosis not present

## 2017-02-03 DIAGNOSIS — H26493 Other secondary cataract, bilateral: Secondary | ICD-10-CM | POA: Diagnosis not present

## 2017-02-03 DIAGNOSIS — H43813 Vitreous degeneration, bilateral: Secondary | ICD-10-CM | POA: Diagnosis not present

## 2017-02-03 NOTE — Telephone Encounter (Signed)
Pt came in and wants to know if you will except her as a new patient? Please advise thanks.

## 2017-02-03 NOTE — Telephone Encounter (Signed)
Pt was last seen 2016 by me, so is currently an active patient, since this is less than 3 yrs  OK to make ROV

## 2017-02-03 NOTE — Telephone Encounter (Signed)
Called pt and left a message with husband for her to give us a call back to schedule appt. Thanks.

## 2017-02-09 ENCOUNTER — Encounter: Payer: Self-pay | Admitting: Internal Medicine

## 2017-02-09 ENCOUNTER — Ambulatory Visit (INDEPENDENT_AMBULATORY_CARE_PROVIDER_SITE_OTHER): Payer: Medicare Other | Admitting: Internal Medicine

## 2017-02-09 VITALS — BP 128/68 | HR 60 | Temp 98.6°F | Ht 64.0 in | Wt 115.0 lb

## 2017-02-09 DIAGNOSIS — M25552 Pain in left hip: Secondary | ICD-10-CM

## 2017-02-09 DIAGNOSIS — M25551 Pain in right hip: Secondary | ICD-10-CM | POA: Diagnosis not present

## 2017-02-09 DIAGNOSIS — F329 Major depressive disorder, single episode, unspecified: Secondary | ICD-10-CM | POA: Diagnosis not present

## 2017-02-09 DIAGNOSIS — R739 Hyperglycemia, unspecified: Secondary | ICD-10-CM

## 2017-02-09 DIAGNOSIS — F411 Generalized anxiety disorder: Secondary | ICD-10-CM

## 2017-02-09 DIAGNOSIS — I10 Essential (primary) hypertension: Secondary | ICD-10-CM | POA: Diagnosis not present

## 2017-02-09 DIAGNOSIS — E785 Hyperlipidemia, unspecified: Secondary | ICD-10-CM

## 2017-02-09 DIAGNOSIS — F32A Depression, unspecified: Secondary | ICD-10-CM

## 2017-02-09 MED ORDER — TIZANIDINE HCL 4 MG PO TABS: 4.0000 mg | ORAL_TABLET | Freq: Every day | ORAL | 2 refills | Status: DC

## 2017-02-09 MED ORDER — CITALOPRAM HYDROBROMIDE 10 MG PO TABS: 10.0000 mg | ORAL_TABLET | Freq: Every day | ORAL | 3 refills | Status: DC

## 2017-02-09 NOTE — Progress Notes (Signed)
Subjective:    Patient ID: Erika Gibson, female    DOB: 09-04-1928, 83 y.o.   MRN: [id number]  HPI  Here to f/u, c/o worsening depressive symptoms without SI or HI, anxiety overall worsening and cont's to be assoc with intermittent diarrhea; c/o muscle tension to the bilat upper back, also c/o several wks bilat hip pain mild to mod but persistent, hurts to walk, does not see ortho, not sure what to do. No falls yet but more unsteady.  Pt denies chest pain, increased sob or doe, wheezing, orthopnea, PND, increased LE swelling, palpitations, dizziness or syncope.  Pt denies new neurological symptoms such as new headache, or facial or extremity weakness or numbness   Pt denies polydipsia, polyuria Past Medical History:  Diagnosis Date  . Anxiety state, unspecified   . ASTHMA   . Colitis, collagenous 2010  . COLONIC POLYPS, HX OF 06/2002   adenomatous polyp  . DEPRESSION   . HYPERLIPIDEMIA   . HYPERTENSION   . Nephrolithiasis   . Osteopenia    Past Surgical History:  Procedure Laterality Date  . ANKLE SURGERY     removed pins from ankle bone  . BREAST SURGERY     Implants  . DILATION AND CURETTAGE OF UTERUS    . TONSILLECTOMY      reports that she has quit smoking. She has never used smokeless tobacco. She reports that she does not drink alcohol or use drugs. family history includes Alzheimer's disease in her father, mother, and other; Breast cancer in her paternal aunt; Colon polyps in her brother; Heart disease in her father. Allergies  Allergen Reactions  . Mesalamine     REACTION: fuzzy headed and unsteady   Current Outpatient Prescriptions on File Prior to Visit  Medication Sig Dispense Refill  . atorvastatin (LIPITOR) 10 MG tablet Take 10 mg by mouth daily.      Marland Kitchen. lisinopril (PRINIVIL,ZESTRIL) 10 MG tablet Take 10 mg by mouth daily.      . Multiple Vitamin (MULTIVITAMIN) tablet Take 1 tablet by mouth daily.      . Probiotic Product (PROBIOTIC DAILY PO) Take 1 tablet by mouth  daily.    Marland Kitchen. triamterene-hydrochlorothiazide (MAXZIDE-25) 37.5-25 MG per tablet Take 1 tablet by mouth daily.       No current facility-administered medications on file prior to visit.    Review of Systems  Constitutional: Negative for unusual diaphoresis or night sweats HENT: Negative for ear swelling or discharge Eyes: Negative for worsening visual haziness  Respiratory: Negative for choking and stridor.   Gastrointestinal: Negative for distension or worsening eructation Genitourinary: Negative for retention or change in urine volume.  Musculoskeletal: Negative for other MSK pain or swelling Skin: Negative for color change and worsening wound Neurological: Negative for tremors and numbness other than noted  Psychiatric/Behavioral: Negative for decreased concentration or agitation other than above   All other system neg per pt    Objective:   Physical Exam BP 128/68   Pulse 60   Temp 98.6 F (37 C)   Ht 5\' 4"  (1.626 m)   Wt 115 lb (52.2 kg)   SpO2 98%   BMI 19.74 kg/m  VS noted,  Constitutional: Pt appears in no apparent distress HENT: Head: NCAT.  Right Ear: External ear normal.  Left Ear: External ear normal.  Eyes: . Pupils are equal, round, and reactive to light. Conjunctivae and EOM are normal Neck: Normal range of motion. Neck supple.  Cardiovascular: Normal rate and regular  rhythm.   Pulmonary/Chest: Effort normal and breath sounds without rales or wheezing.  Spine nontender Has mild tender to bilat paracervical and trapezoid area Neurological: Pt is alert. Not confused , motor grossly intact Skin: Skin is warm. No rash, no LE edema Psychiatric: Pt behavior is normal. No agitation.  No other new exam finding    Assessment & Plan:

## 2017-02-09 NOTE — Patient Instructions (Signed)
Please take all new medication as prescribed - the celexa 10 mg per day for nerves and low mood  Please take all new medication as prescribed - tizanidine 4 mg at bedtime to help with the muscles relaxer  You will be contacted regarding the referral for: Dr Katrinka BlazingSmith in this office for the hip pain on both sides  Please continue all other medications as before, and refills have been done if requested.  Please have the pharmacy call with any other refills you may need.  Please keep your appointments with your specialists as you may have planned  Please go to the LAB in the Basement (turn left off the elevator) for the tests to be done tomorrow  You will be contacted by phone if any changes need to be made immediately.  Otherwise, you will receive a letter about your results with an explanation, but please check with MyChart first.  Please remember to sign up for MyChart if you have not done so, as this will be important to you in the future with finding out test results, communicating by private email, and scheduling acute appointments online when needed.  Please return in 6 months, or sooner if needed

## 2017-02-10 ENCOUNTER — Other Ambulatory Visit (INDEPENDENT_AMBULATORY_CARE_PROVIDER_SITE_OTHER): Payer: Medicare Other

## 2017-02-10 DIAGNOSIS — E785 Hyperlipidemia, unspecified: Secondary | ICD-10-CM

## 2017-02-10 DIAGNOSIS — R739 Hyperglycemia, unspecified: Secondary | ICD-10-CM | POA: Diagnosis not present

## 2017-02-10 LAB — CBC WITH DIFFERENTIAL/PLATELET
BASOS ABS: 0.1 10*3/uL (ref 0.0–0.1)
Basophils Relative: 0.7 % (ref 0.0–3.0)
EOS ABS: 0.2 10*3/uL (ref 0.0–0.7)
EOS PCT: 2 % (ref 0.0–5.0)
HCT: 40.5 % (ref 36.0–46.0)
HEMOGLOBIN: 13.7 g/dL (ref 12.0–15.0)
Lymphocytes Relative: 20.5 % (ref 12.0–46.0)
Lymphs Abs: 1.8 10*3/uL (ref 0.7–4.0)
MCHC: 34 g/dL (ref 30.0–36.0)
MCV: 91.7 fl (ref 78.0–100.0)
MONO ABS: 0.9 10*3/uL (ref 0.1–1.0)
Monocytes Relative: 10.4 % (ref 3.0–12.0)
Neutro Abs: 5.9 10*3/uL (ref 1.4–7.7)
Neutrophils Relative %: 66.4 % (ref 43.0–77.0)
Platelets: 264 10*3/uL (ref 150.0–400.0)
RBC: 4.41 Mil/uL (ref 3.87–5.11)
RDW: 15 % (ref 11.5–15.5)
WBC: 9 10*3/uL (ref 4.0–10.5)

## 2017-02-10 LAB — HEMOGLOBIN A1C: HEMOGLOBIN A1C: 5.8 % (ref 4.6–6.5)

## 2017-02-10 LAB — URINALYSIS, ROUTINE W REFLEX MICROSCOPIC
Bilirubin Urine: NEGATIVE
Hgb urine dipstick: NEGATIVE
KETONES UR: NEGATIVE
LEUKOCYTES UA: NEGATIVE
NITRITE: NEGATIVE
PH: 5.5 (ref 5.0–8.0)
RBC / HPF: NONE SEEN (ref 0–?)
SPECIFIC GRAVITY, URINE: 1.025 (ref 1.000–1.030)
Total Protein, Urine: NEGATIVE
URINE GLUCOSE: NEGATIVE
UROBILINOGEN UA: 0.2 (ref 0.0–1.0)
WBC, UA: NONE SEEN (ref 0–?)

## 2017-02-10 LAB — LIPID PANEL
CHOL/HDL RATIO: 3
Cholesterol: 160 mg/dL (ref 0–200)
HDL: 59.2 mg/dL (ref >39.00)
LDL Cholesterol: 81 mg/dL (ref 0–99)
NONHDL: 100.63
TRIGLYCERIDES: 99 mg/dL (ref 0.0–149.0)
VLDL: 19.8 mg/dL (ref 0.0–40.0)

## 2017-02-10 LAB — BASIC METABOLIC PANEL
BUN: 29 mg/dL — AB (ref 6–23)
CALCIUM: 9.7 mg/dL (ref 8.4–10.5)
CHLORIDE: 104 meq/L (ref 96–112)
CO2: 27 meq/L (ref 19–32)
CREATININE: 1.38 mg/dL — AB (ref 0.40–1.20)
GFR: 38.48 mL/min — ABNORMAL LOW (ref >60.00)
Glucose, Bld: 83 mg/dL (ref 70–99)
Potassium: 4.4 mEq/L (ref 3.5–5.1)
Sodium: 140 mEq/L (ref 135–145)

## 2017-02-10 LAB — HEPATIC FUNCTION PANEL
ALBUMIN: 4.2 g/dL (ref 3.5–5.2)
ALT: 17 U/L (ref 0–35)
AST: 19 U/L (ref 0–37)
Alkaline Phosphatase: 56 U/L (ref 39–117)
Bilirubin, Direct: 0.1 mg/dL (ref 0.0–0.3)
Total Bilirubin: 0.9 mg/dL (ref 0.2–1.2)
Total Protein: 7 g/dL (ref 6.0–8.3)

## 2017-02-10 LAB — TSH: TSH: 3.93 u[IU]/mL (ref 0.35–4.50)

## 2017-02-11 DIAGNOSIS — M25551 Pain in right hip: Secondary | ICD-10-CM | POA: Insufficient documentation

## 2017-02-11 DIAGNOSIS — M25552 Pain in left hip: Secondary | ICD-10-CM

## 2017-02-11 NOTE — Assessment & Plan Note (Signed)
stable overall by history and exam, recent data reviewed with pt, and pt to continue medical treatment as before,  to f/u any worsening symptoms or concerns Lab Results  Component Value Date   LDLCALC 81 02/10/2017

## 2017-02-11 NOTE — Assessment & Plan Note (Signed)
Mild worsening, declines referral for counseling or psychiatry

## 2017-02-11 NOTE — Assessment & Plan Note (Addendum)
D/w pt, declines pain med, ok for tizanidine prn upper back and hip pain, refer sport med for further eval and tx

## 2017-02-11 NOTE — Assessment & Plan Note (Signed)
stable overall by history and exam, recent data reviewed with pt, and pt to continue medical treatment as before,  to f/u any worsening symptoms or concerns BP Readings from Last 3 Encounters:  02/09/17 128/68  01/16/17 136/73  10/04/16 136/75

## 2017-02-11 NOTE — Assessment & Plan Note (Addendum)
New worsening , for celexa 10 qd, declines counseling as above  Note:  Total time for pt hx, exam, review of record with pt in the room, determination of diagnoses and plan for further eval and tx is > 40 min, with over 50% spent in coordination and counseling of patient

## 2017-02-14 ENCOUNTER — Encounter: Payer: Self-pay | Admitting: Internal Medicine

## 2017-02-25 NOTE — Progress Notes (Signed)
[name] [address] [place], [place] [address] Phone: [phone] Subjective:    I'm seeing this patient by the request  of:  DEBBIE SCHOENHOFF, MD   CC: Hip pain  [id number]  Erika Gibson is a 83 y.o. female coming in with complaint of hip pain. Patient past medical history significant for chronic kidney disease, osteopenia, as well as sacroiliac pain. Patient states that she is having more bilateral hip pain. Seems to be on the lateral aspect. Seems to be worse after activity. Patient states that when she sits for some time and then gets up it causes severe amount of pain. Patient though denies any radiation of pain or any numbness or weakness. Patient denies any groin pain. States that she was to be more active with a better weather coming but is concerned that if she increases activity this will increase the pain.   patient did have x-rays taken 04/13/2016. These were independently visualized by me. Patient was found to have demineralization with moderate degenerative changes of both hips. Patient may also have some mild degenerative changes of the left sacroiliac joint.  Past Medical History:  Diagnosis Date  . Anxiety state, unspecified   . ASTHMA   . Colitis, collagenous 2010  . COLONIC POLYPS, HX OF 06/2002   adenomatous polyp  . DEPRESSION   . HYPERLIPIDEMIA   . HYPERTENSION   . Nephrolithiasis   . Osteopenia    Past Surgical History:  Procedure Laterality Date  . ANKLE SURGERY     removed pins from ankle bone  . BREAST SURGERY     Implants  . DILATION AND CURETTAGE OF UTERUS    . TONSILLECTOMY     Social History   Social History  . Marital status: Widowed    Spouse name: N/A  . Number of children: 2  . Years of education: N/A   Social History Main Topics  . Smoking status: Former Games developer  . Smokeless tobacco: Never Used     Comment: Stopped 45 years ago  . Alcohol use No  . Drug use: No  . Sexual activity: Not Asked    Other Topics Concern  . None   Social History Narrative   Widowed 07/2014 (spouse with dementia for years prior to death)   Lives w/ alcoholic son   Allergies  Allergen Reactions  . Mesalamine     REACTION: fuzzy headed and unsteady   Family History  Problem Relation Age of Onset  . Alzheimer's disease Mother   . Heart disease Father   . Alzheimer's disease Father   . Colon polyps Brother   . Breast cancer Paternal Aunt   . Alzheimer's disease Other     Past medical history, social, surgical and family history all reviewed in electronic medical record.  No pertanent information unless stated regarding to the chief complaint.   Review of Systems:Review of systems updated and as accurate as of 02/27/17  No headache, visual changes, nausea, vomiting, diarrhea, constipation, dizziness, abdominal pain, skin rash, fevers, chills, night sweats, weight loss, swollen lymph nodes, body aches, joint swelling, muscle aches, chest pain, shortness of breath, mood changes.   Objective  Blood pressure 136/64, pulse 80, height 5\' 4"  (1.626 m), weight 115 lb 6.4 oz (52.3 kg), SpO2 97 %. Systems examined below as of 02/27/17   General: No apparent distress alert and oriented x3 mood and affect normal, dressed appropriately.  HEENT: Pupils equal, extraocular movements intact  Respiratory: Patient's speak  in full sentences and does not appear short of breath  Cardiovascular: No lower extremity edema, non tender, no erythema  Skin: Warm dry intact with no signs of infection or rash on extremities or on axial skeleton.  Abdomen: Soft nontender  Neuro: Cranial nerves II through XII are intact, neurovascularly intact in all extremities with 2+ DTRs and 2+ pulses.  Lymph: No lymphadenopathy of posterior or anterior cervical chain or axillae bilaterally.  Gait normal with good balance and coordination.  MSK:  Non tender with full range of motion and good stability and symmetric strength and tone of  shoulders, elbows, wrist, hip, knee and ankles bilaterally. Arthritic changes of multiple joints Back Exam:  Inspection: Unremarkable  Motion: Flexion 45 deg, Extension 25 deg, Side Bending to 45 deg bilaterally,  Rotation to 45 deg bilaterally  SLR laying: Negative  XSLR laying: Negative  Palpable tenderness: None. FABER: Mild tightness bilaterally. Tenderness of the lateral aspect of the hips bilaterally. Very minimal decrease in internal range of motion lacking the last 5 on the right side of the last 10 on the left side.. Sensory change: Gross sensation intact to all lumbar and sacral dermatomes.  Reflexes: 2+ at both patellar tendons, 2+ at achilles tendons, Babinski's downgoing.  Strength at foot  Plantar-flexion: 5/5 Dorsi-flexion: 5/5 Eversion: 5/5 Inversion: 5/5  Leg strength  Quad: 5/5 Hamstring: 5/5 Hip flexor: 5/5 Hip abductors: 4/5 but symmetric Gait unremarkable.  Procedure note 97110; 15 minutes spent for Therapeutic exercises as stated in above notes.  This included exercises focusing on stretching, strengthening, with significant focus on eccentric aspects. Hip strengthening exercises which included:  Pelvic tilt/bracing to help with proper recruitment of the lower abs and pelvic floor muscles  Glute strengthening to properly contract glutes without over-engaging low back and hamstrings - prone hip extension and glute bridge exercises Proper stretching techniques to increase effectiveness for the hip flexors, groin, quads, piriformic and low back when appropriate      Proper technique shown and discussed handout in great detail with ATC.  All questions were discussed and answered.      Impression and Recommendations:     This case required medical decision making of moderate complexity.      Note: This dictation was prepared with Dragon dictation along with smaller phrase technology. Any transcriptional errors that result from this process are unintentional.

## 2017-02-27 ENCOUNTER — Encounter: Payer: Self-pay | Admitting: Family Medicine

## 2017-02-27 ENCOUNTER — Ambulatory Visit (INDEPENDENT_AMBULATORY_CARE_PROVIDER_SITE_OTHER): Payer: Medicare Other | Admitting: Family Medicine

## 2017-02-27 DIAGNOSIS — M7062 Trochanteric bursitis, left hip: Secondary | ICD-10-CM | POA: Diagnosis not present

## 2017-02-27 DIAGNOSIS — M7061 Trochanteric bursitis, right hip: Secondary | ICD-10-CM | POA: Diagnosis not present

## 2017-02-27 NOTE — Assessment & Plan Note (Signed)
Patient does have a bursitis bilaterally. Patient elected try conservative therapy. Given topical anti-inflammatories, we discussed icing regimen and home exercises, we discussed which activities to do an which was to avoid. Patient try to make these changes and come back and see me again in 3-4 weeks. No significant improvement we'll consider injections.

## 2017-02-27 NOTE — Patient Instructions (Signed)
Good to see you.  Ice 20 minutes 2 times daily. Usually after activity and before bed. Exercises 3 times a week.  pennsaid pinkie amount topically 2 times daily as needed.  Vitamin D 4000 IU daily for 2 weeks then 2000 IU daily thereafter Turmeric 500mg  daily  Good shoes with rigid bottom.  Erika HarnessKeen, Dansko, Merrell or New balance greater then 700 See me again in 3 weeks and if not better we can consider injection.

## 2017-03-09 ENCOUNTER — Other Ambulatory Visit: Payer: Self-pay | Admitting: Internal Medicine

## 2017-03-09 MED ORDER — CITALOPRAM HYDROBROMIDE 20 MG PO TABS: 20.0000 mg | ORAL_TABLET | Freq: Every day | ORAL | 3 refills | Status: DC

## 2017-03-09 NOTE — Telephone Encounter (Signed)
Pt left note to request incresase celexa -=   Ok to let pt know - ok for 20 mg daily

## 2017-03-13 ENCOUNTER — Other Ambulatory Visit: Payer: Self-pay | Admitting: Internal Medicine

## 2017-03-13 NOTE — Telephone Encounter (Signed)
Patient was informed and stated that she picked up the prescription 3 days ago.

## 2017-03-21 ENCOUNTER — Ambulatory Visit: Payer: Self-pay | Admitting: Family Medicine

## 2017-03-26 NOTE — Progress Notes (Signed)
[name] [address] [place], [place] [address] Phone: [phone] Subjective:    CC: Hip pain f/u  [id number]  Erika Gibson Erika Gibson is a 83 y.o. female coming in with complaint of hip pain. Patient past medical history significant for chronic kidney disease, osteopenia, as well as sacroiliac pain. Patient states that she is having more bilateral hip pain. Seems to be on the lateral aspect. Seems to be worse after activity.Patient sent have more of a greater trochanter bursitis. Patient will was not able to do the exercises regularly. Patient continues to have pain on the backside. Since then has severe enough to stop her from some activity. Patient is wanting more correction overall.   patient did have x-rays taken 04/13/2016. These were independently visualized by me. Patient was found to have demineralization with moderate degenerative changes of both hips. Patient may also have some mild degenerative changes of the left sacroiliac joint.   Past Medical History:  Diagnosis Date  . Anxiety state, unspecified   . ASTHMA   . Colitis, collagenous 2010  . COLONIC POLYPS, HX OF 06/2002   adenomatous polyp  . DEPRESSION   . HYPERLIPIDEMIA   . HYPERTENSION   . Nephrolithiasis   . Osteopenia    Past Surgical History:  Procedure Laterality Date  . ANKLE SURGERY     removed pins from ankle bone  . BREAST SURGERY     Implants  . DILATION AND CURETTAGE OF UTERUS    . TONSILLECTOMY     Social History   Social History  . Marital status: Widowed    Spouse name: Erika Gibson  . Number of children: 2  . Years of education: Erika Gibson   Social History Main Topics  . Smoking status: Former Games developer  . Smokeless tobacco: Never Used     Comment: Stopped 45 years ago  . Alcohol use No  . Drug use: No  . Sexual activity: Not Asked   Other Topics Concern  . None   Social History Narrative   Widowed 07/2014 (spouse with dementia for years prior to death)   Lives w/  alcoholic son   Allergies  Allergen Reactions  . Mesalamine     REACTION: fuzzy headed and unsteady   Family History  Problem Relation Age of Onset  . Alzheimer's disease Mother   . Heart disease Father   . Alzheimer's disease Father   . Colon polyps Brother   . Breast cancer Paternal Aunt   . Alzheimer's disease Other     Past medical history, social, surgical and family history all reviewed in electronic medical record.  No pertanent information unless stated regarding to the chief complaint.   Review of Systems: No headache, visual changes, nausea, vomiting, diarrhea, constipation, , abdominal pain, skin rash, fevers, chills, night sweats, weight loss, swollen lymph nodes,chest pain, shortness of breath, mood changes.  Positive muscle aches and joint aches positive dizziness with her vertigo  Objective  Blood pressure 108/62, pulse 92, resp. rate 18, weight 114 lb 6 oz (51.9 kg), SpO2 97 %. Systems examined below as of 03/27/17   General: No apparent distress alert and oriented x3 mood and affect normal, dressed appropriately.  HEENT: Pupils equal, extraocular movements intact  Respiratory: Patient's speak in full sentences and does not appear short of breath  Cardiovascular: No lower extremity edema, non tender, no erythema  Skin: Warm dry intact with no signs of infection or rash on extremities or on axial skeleton.  Abdomen:  Soft nontender  Neuro: Cranial nerves II through XII are intact, neurovascularly intact in all extremities with 2+ DTRs and 2+ pulses.  Lymph: No lymphadenopathy of posterior or anterior cervical chain or axillae bilaterally.  Gait normal with good balance and coordination.  MSK:  Non tender with full range of motion and good stability and symmetric strength and tone of shoulders, elbows, wrist, hip, knee and ankles bilaterally. Arthritic changes of multiple joints Back Exam:  Inspection: Unremarkable  Motion: Flexion 45 deg, Extension 25 deg, Side  Bending to 45 deg bilaterally,  Rotation to 45 deg bilaterally  SLR laying: Negative  XSLR laying: Negative  Palpable tenderness: Tender to palpation in the paraspinal musculature lumbar spine as well as over the left greater trochanteric area FABER: Continued pain mostly on the lateral aspect of the left hip Very minimal decrease in internal range of motion lacking the last 5 on the right side of the last 10 on the left side.. Does not appear to give her pain Sensory change: Gross sensation intact to all lumbar and sacral dermatomes.  Reflexes: 2+ at both patellar tendons, 2+ at achilles tendons, Babinski's downgoing.  Strength at foot  Plantar-flexion: 5/5 Dorsi-flexion: 5/5 Eversion: 5/5 Inversion: 5/5  Leg strength  Quad: 5/5 Hamstring: 5/5 Hip flexor: 5/5 Hip abductors: 4/5 but symmetric Gait unremarkable.   Wt Readings from Last 3 Encounters:  03/27/17 114 lb 6 oz (51.9 kg)  02/27/17 115 lb 6.4 oz (52.3 kg)  02/09/17 115 lb (52.2 kg)     Impression and Recommendations:     This case required medical decision making of moderate complexity.      Note: This dictation was prepared with Dragon dictation along with smaller phrase technology. Any transcriptional errors that result from this process are unintentional.

## 2017-03-27 ENCOUNTER — Encounter: Payer: Self-pay | Admitting: Family Medicine

## 2017-03-27 ENCOUNTER — Ambulatory Visit (INDEPENDENT_AMBULATORY_CARE_PROVIDER_SITE_OTHER): Payer: Medicare Other | Admitting: Family Medicine

## 2017-03-27 VITALS — BP 108/62 | HR 92 | Resp 18 | Wt 114.4 lb

## 2017-03-27 DIAGNOSIS — M7060 Trochanteric bursitis, unspecified hip: Secondary | ICD-10-CM

## 2017-03-27 DIAGNOSIS — M7061 Trochanteric bursitis, right hip: Secondary | ICD-10-CM | POA: Diagnosis not present

## 2017-03-27 DIAGNOSIS — M7062 Trochanteric bursitis, left hip: Secondary | ICD-10-CM | POA: Diagnosis not present

## 2017-03-27 NOTE — Patient Instructions (Signed)
Good to see you  We will get you in with physical therapy  I think they will do great  Ice is your friend.  Stay active See me again in 4-6 weeks and if not better we will consider injection of look into the back.

## 2017-03-27 NOTE — Progress Notes (Signed)
Pre-visit discussion using our clinic review tool. No additional management support is needed unless otherwise documented below in the visit note.  

## 2017-03-27 NOTE — Assessment & Plan Note (Signed)
I believe the patient is having more discomfort secondary to the greater trochanter bursitis. I do believe the patient does have some muscle atrophy. Patient will go to formal physical therapy with her not being able to do the exercises on her own very well. Think that this could be beneficial. We discussed core stability and exercises. We discussed over-the-counter orthotics. We discussed proper shoes. Patient declined any type of injection. Follow-up again in 4-6 weeks.  Spent  25 minutes with patient face-to-face and had greater than 50% of counseling including as described above in assessment and plan.

## 2017-03-31 ENCOUNTER — Ambulatory Visit (INDEPENDENT_AMBULATORY_CARE_PROVIDER_SITE_OTHER): Payer: Medicare Other | Admitting: Internal Medicine

## 2017-03-31 ENCOUNTER — Encounter: Payer: Self-pay | Admitting: Internal Medicine

## 2017-03-31 VITALS — BP 150/82 | HR 94 | Ht 64.0 in | Wt 116.0 lb

## 2017-03-31 DIAGNOSIS — I10 Essential (primary) hypertension: Secondary | ICD-10-CM

## 2017-03-31 DIAGNOSIS — G4700 Insomnia, unspecified: Secondary | ICD-10-CM | POA: Diagnosis not present

## 2017-03-31 DIAGNOSIS — F411 Generalized anxiety disorder: Secondary | ICD-10-CM | POA: Diagnosis not present

## 2017-03-31 DIAGNOSIS — F329 Major depressive disorder, single episode, unspecified: Secondary | ICD-10-CM

## 2017-03-31 DIAGNOSIS — F32A Depression, unspecified: Secondary | ICD-10-CM

## 2017-03-31 NOTE — Progress Notes (Signed)
Subjective:    Patient ID: Erika Gibson, female    DOB: [birth year], 83 y.o.   MRN: [id number]  HPI  Here to f/u, as for some reason was hesitant to increase the celexa after last phone note she initiated.  Has had some improvement in depressive symptoms but still markedly nervous, and insomnia but improved with unisom.  Denies worsening depressive symptoms, suicidal ideation, or panic.  Pt denies chest pain, increased sob or doe, wheezing, orthopnea, PND, increased LE swelling, palpitations, dizziness or syncope.  Pt denies new neurological symptoms such as new headache, or facial or extremity weakness or numbness  Had recent eval and tx for bilat hip bursitis per sport med successfully. Past Medical History:  Diagnosis Date  . Anxiety state, unspecified   . ASTHMA   . Colitis, collagenous 2010  . COLONIC POLYPS, HX OF 06/2002   adenomatous polyp  . DEPRESSION   . HYPERLIPIDEMIA   . HYPERTENSION   . Nephrolithiasis   . Osteopenia    Past Surgical History:  Procedure Laterality Date  . ANKLE SURGERY     removed pins from ankle bone  . BREAST SURGERY     Implants  . DILATION AND CURETTAGE OF UTERUS    . TONSILLECTOMY      reports that she has quit smoking. She has never used smokeless tobacco. She reports that she does not drink alcohol or use drugs. family history includes Alzheimer's disease in her father, mother, and other; Breast cancer in her paternal aunt; Colon polyps in her brother; Heart disease in her father. Allergies  Allergen Reactions  . Mesalamine     REACTION: fuzzy headed and unsteady   Current Outpatient Prescriptions on File Prior to Visit  Medication Sig Dispense Refill  . aspirin EC 81 MG tablet Take 81 mg by mouth daily.    Marland Kitchen atorvastatin (LIPITOR) 10 MG tablet Take 10 mg by mouth daily.      . Cholecalciferol (VITAMIN D PO) Take by mouth.    . citalopram (CELEXA) 20 MG tablet Take 1 tablet (20 mg total) by mouth daily. 90 tablet 3  . lisinopril  (PRINIVIL,ZESTRIL) 10 MG tablet Take 10 mg by mouth daily.      . Multiple Vitamin (MULTIVITAMIN) tablet Take 1 tablet by mouth daily.      . Probiotic Product (PROBIOTIC DAILY PO) Take 1 tablet by mouth daily.    Marland Kitchen triamterene-hydrochlorothiazide (MAXZIDE-25) 37.5-25 MG per tablet Take 1 tablet by mouth daily.      . Turmeric 500 MG CAPS Take 1 capsule by mouth daily.     No current facility-administered medications on file prior to visit.    Review of Systems  Constitutional: Negative for other unusual diaphoresis or sweats HENT: Negative for ear discharge or swelling Eyes: Negative for other worsening visual disturbances Respiratory: Negative for stridor or other swelling  Gastrointestinal: Negative for worsening distension or other blood Genitourinary: Negative for retention or other urinary change Musculoskeletal: Negative for other MSK pain or swelling Skin: Negative for color change or other new lesions Neurological: Negative for worsening tremors and other numbness  Psychiatric/Behavioral: Negative for worsening agitation or other fatigue All other     Objective:   Physical Exam BP (!) 150/82   Pulse 94   Ht  (1.626 m)   Wt 116 lb (52.6 kg)   SpO2 98%   BMI 19.91 kg/m  VS noted,  Constitutional: Pt appears in NAD HENT: Head: NCAT.  Right Ear: External  ear normal.  Left Ear: External ear normal.  Eyes: . Pupils are equal, round, and reactive to light. Conjunctivae and EOM are normal Nose: without d/c or deformity Neck: Neck supple. Gross normal ROM Cardiovascular: Normal rate and regular rhythm.   Pulmonary/Chest: Effort normal and breath sounds without rales or wheezing.  Abd:  Soft, NT, ND, + BS, no organomegaly Neurological: Pt is alert. At baseline orientation, motor grossly intact Skin: Skin is warm. No rashes, other new lesions, no LE edema Psychiatric: Pt behavior is normal without agitation but + depressed nervous affect    Assessment & Plan:

## 2017-03-31 NOTE — Assessment & Plan Note (Signed)
Some improved symptomatically, but for increased celexa 20 qd 

## 2017-03-31 NOTE — Progress Notes (Signed)
Pre visit review using our clinic review tool, if applicable. No additional management support is needed unless otherwise documented below in the visit note. 

## 2017-03-31 NOTE — Assessment & Plan Note (Signed)
Ok to continue the [employer] prn,  to f/u any worsening symptoms or concerns

## 2017-03-31 NOTE — Assessment & Plan Note (Signed)
Some improved symptomatically, but for increased celexa 20 qd

## 2017-03-31 NOTE — Assessment & Plan Note (Signed)
Mild elev today likely situational, o/w stable overall by history and exam, recent data reviewed with pt, and pt to continue medical treatment as before,  to f/u any worsening symptoms or concerns BP Readings from Last 3 Encounters:  03/31/17 (!) 150/82  03/27/17 108/62  02/27/17 136/64

## 2017-03-31 NOTE — Patient Instructions (Signed)
OK to increase the celexa to 20 mg per day  OK to cont the Unisom as needed  Please continue all other medications as before, and refills have been done if requested.  Please have the pharmacy call with any other refills you may need  Please keep your appointments with your specialists as you may have planned

## 2017-04-03 ENCOUNTER — Ambulatory Visit (INDEPENDENT_AMBULATORY_CARE_PROVIDER_SITE_OTHER): Payer: Medicare Other | Admitting: Physical Therapy

## 2017-04-03 DIAGNOSIS — M25551 Pain in right hip: Secondary | ICD-10-CM

## 2017-04-03 DIAGNOSIS — M6281 Muscle weakness (generalized): Secondary | ICD-10-CM

## 2017-04-03 DIAGNOSIS — M25552 Pain in left hip: Secondary | ICD-10-CM

## 2017-04-03 NOTE — Patient Instructions (Addendum)
Bridge   Lie back, legs bent. Inhale, pressing hips up. Hold for 5 seconds.  Slowly lower. Repeat _10___ times. Do __1-2__ sessions per day.  Copyright  VHI. All rights reserved.     Copyright  VHI. All rights reserved. Knee to Chest (Flexion)   Pull knee toward chest. Feel stretch in lower back or buttock area. Breathing deeply, Hold __20-30__ seconds. Repeat with other knee. Repeat _2-3___ times. Do _1-2___ sessions per day.  YOU CAN ALSO BRING KNEE TO OPPOSITE SHOULDER FOR DIFFERENT STRETCH.  HOLD 20-30 SECONDS.  PERFORM 2-3 REPS ON EACH SIDE.  http://gt2.exer.us/225   Copyright  VHI. All rights reserved.     Lower Trunk Rotation Stretch   Keeping back flat and feet together, rotate knees to left side. Hold _20-30___ seconds. Repeat __2-3__ times per set. Do __1__ sets per session. Do __1-2__ sessions per day.  http://orth.exer.us/122   Copyright  VHI. All rights reserved.    Hip Flexion / Knee Extension: Straight-Leg Raise (Eccentric)   Lie on back. Lift leg with knee straight. Lift 6-8 inches off bed.  Slowly lower leg. _10__ reps per set, _1__ sets per day, _7__ days per week. Lower like elevator, stopping at each floor.   ABDUCTION: Side-Lying (Active)   Lie on left side, top leg straight. Raise top leg 6-8 inches keeping toe pointed ahead. Repeat with other leg. Complete _1__ sets of __10_ repetitions. Perform __1-2_ sessions per day.  http://gtsc.exer.us/94   (Home) Extension: Hip    Lift right leg in line with body. Repeat with other leg.  Repeat _10___ times per set. Do _1___ sets per session. Do __7__ sessions per week.

## 2017-04-03 NOTE — Therapy (Signed)
[facility] [address] [place], [place], [address] Phone: [phone]   Fax:  [phone]  Physical Therapy Evaluation  Patient Details  Name: Erika Gibson MRN: [id number] Date of Birth: 10/23/1928 Referring Provider: Dr. Terrilee Files  Encounter Date: 04/03/2017      PT End of Session - 04/03/17 1056    Visit Number 1   Number of Visits 12   Date for PT Re-Evaluation 05/15/17   Authorization Type Medicare   PT Start Time 1010   PT Stop Time 1054   PT Time Calculation (min) 44 min   Activity Tolerance Patient tolerated treatment well   Behavior During Therapy [facility] for tasks assessed/performed      Past Medical History:  Diagnosis Date  . Anxiety state, unspecified   . ASTHMA   . Colitis, collagenous 2010  . COLONIC POLYPS, HX OF 06/2002   adenomatous polyp  . DEPRESSION   . HYPERLIPIDEMIA   . HYPERTENSION   . Nephrolithiasis   . Osteopenia     Past Surgical History:  Procedure Laterality Date  . ANKLE SURGERY     removed pins from ankle bone  . BREAST SURGERY     Implants  . DILATION AND CURETTAGE OF UTERUS    . TONSILLECTOMY      There were no vitals filed for this visit.       Subjective Assessment - 04/03/17 1011    Subjective Pt is an 83 y/o who presents to OPPT for bil hip pain, most pronounced with forward bending and prolonged walking.  Pt reports MD provided exercises but unable to complete.  Pt states pain began approx 6 months ago with gradual onset and no known injury.  Pt reports decreased exercise due to winter which may be contributing to symptoms.   Pertinent History memory   Limitations Walking;House hold activities   How long can you stand comfortably? 20-30 minutes   How long can you walk comfortably? 1 mile (some pain after)   Diagnostic tests xrays: DJD   Patient Stated Goals improve pain   Currently in Pain? Yes   Pain Score 0-No pain  up to 8/10   Pain Location Hip   Pain Orientation  Left;Right;Lateral;Posterior   Pain Descriptors / Indicators Sharp;Aching   Pain Type Chronic pain   Pain Onset More than a month ago   Pain Frequency Intermittent   Aggravating Factors  walking, bending, standing, sleeping on side   Pain Relieving Factors sitting, rest   Effect of Pain on Daily Activities changes in sleep and activities            [facility] PT Assessment - 04/03/17 1019      Assessment   Medical Diagnosis bil trochanteric bursitis   Referring Provider Dr. Terrilee Files   Onset Date/Surgical Date --  6 months ago   Next MD Visit 05/09/17   Prior Therapy none     Precautions   Precautions None     Restrictions   Weight Bearing Restrictions No     Balance Screen   Has the patient fallen in the past 6 months Yes   How many times? 1-tripped over rug in kitchen   Has the patient had a decrease in activity level because of a fear of falling?  No   Is the patient reluctant to leave their home because of a fear of falling?  No     Home Nurse, mental health Private residence   Living  Arrangements Children  son (temporary)   Type of Home House   Home Access Stairs to enter   Entrance Stairs-Number of Steps 2   Entrance Stairs-Rails Right;Left;Can reach both   Home Layout Able to live on main level with bedroom/bathroom   Additional Comments has upstairs; reports hip pain with flight, no difficulty with 2 steps outside     Prior Function   Level of Independence Independent   Vocation Retired   Leisure walking outside (1 mile, 3-4 times/wk); [employer], gardening, Doctor, general practice   Overall Cognitive Status No family/caregiver present to determine baseline cognitive functioning   Memory Impaired   Memory Impairment Decreased short term memory  frequent reminders and cues needed with each exercise     AROM   Overall AROM Comments bil hip/knee ROM WNL     Strength   Strength Assessment Site Hip;Knee   Right/Left Hip Right;Left   Right Hip  Flexion 3/5   Right Hip Extension 3-/5   Right Hip External Rotation  3/5   Right Hip Internal Rotation 4-/5   Right Hip ABduction 3/5   Left Hip Flexion 3/5   Left Hip Extension 3-/5   Left Hip External Rotation 3/5   Left Hip Internal Rotation 3+/5   Left Hip ABduction 3/5   Right/Left Knee Right;Left   Right Knee Flexion 3+/5   Right Knee Extension 4/5   Left Knee Flexion 3+/5   Left Knee Extension 4/5     Palpation   Palpation comment tenderness bil piriformis and glut med                   [facility] Adult PT Treatment/Exercise - 04/03/17 1155      Exercises   Exercises Knee/Hip     Lumbar Exercises: Stretches   Single Knee to Chest Stretch 2 reps;30 seconds   Single Knee to Chest Stretch Limitations bil   Lower Trunk Rotation 2 reps;30 seconds   Lower Trunk Rotation Limitations bil   Piriformis Stretch 2 reps;30 seconds   Piriformis Stretch Limitations bil     Knee/Hip Exercises: Supine   Bridges Both;10 reps   Bridges Limitations mod cues for technique   Straight Leg Raises Both;5 reps   Straight Leg Raises Limitations mod cues for technique     Knee/Hip Exercises: Sidelying   Hip ABduction Both;5 reps     Knee/Hip Exercises: Prone   Straight Leg Raises Both;5 reps                PT Education - 04/03/17 1056    Education provided Yes   Education Details HEP   Person(s) Educated Patient   Methods Explanation;Demonstration;Handout;Verbal cues   Comprehension Verbalized understanding;Returned demonstration;Verbal cues required;Need further instruction             PT Long Term Goals - 04/03/17 1206      PT LONG TERM GOAL #1   Title independent with HEP (05/15/17)   Time 6   Period Weeks   Status New     PT LONG TERM GOAL #2   Title report ability to walk at least 1 mile with pain < 3/10 for improved function and return to regular exercise (05/15/17)   Time 6   Period Weeks   Status New     PT LONG TERM GOAL #3   Title  demonstrate at least 4/5 strength in bil hips for improved functional mobility and tolerance to exercise (05/15/17)   Time 6  Period Weeks   Status New     PT LONG TERM GOAL #4   Title report ability to perform yardwork with pain < 4/10 for improved functional mobility (05/15/17)   Time 6   Period Weeks   Status New               Plan - 04/03/17 1158    Clinical Impression Statement Pt is an 83 y/o female who presents to OPPT for low complexity PT eval for bil hip pain.  Pt demonstrates pain and weakness affecting functional mobility.  Will benefit from PT to address deficits.  Noted word finding difficulties and decreased recall of new information during eval so cognition may affect rehab potential.     Rehab Potential Good   Clinical Impairments Affecting Rehab Potential ? cognitive impairments   PT Frequency 2x / week   PT Duration 6 weeks   PT Treatment/Interventions ADLs/Self Care Home Management;Cryotherapy;Electrical Stimulation;Iontophoresis /ml Dexamethasone;Moist Heat;Ultrasound;Therapeutic exercise;Therapeutic activities;Functional mobility training;Patient/family education;Manual techniques;Taping;Dry needling   PT Next Visit Plan review HEP, add standing hip exercises, modalities and ionto PRN (ionto if order signed)   Consulted and Agree with Plan of Care Patient      Patient will benefit from skilled therapeutic intervention in order to improve the following deficits and impairments:  Decreased cognition, Pain, Decreased strength, Decreased mobility  Visit Diagnosis: Pain in left hip - Plan: PT plan of care cert/re-cert  Pain in right hip - Plan: PT plan of care cert/re-cert  Muscle weakness (generalized) - Plan: PT plan of care cert/re-cert     Problem List Patient Active Problem List   Diagnosis Date Noted  . Greater trochanteric bursitis of both hips 02/27/2017  . Bilateral hip pain 02/11/2017  . Abnormal electrocardiogram 03/11/2016  . Chest pain  03/11/2016  . Hammertoe 01/04/2016  . Osteopenia   . Allergic reaction to bee sting 06/21/2012  . CKD (chronic kidney disease) 03/06/2012  . SI (sacroiliac) pain 03/11/2011  . Insomnia 03/11/2011  . POSTMENOPAUSAL SYNDROME 02/10/2010  . Anxiety state 10/14/2009  . RENAL CALCULUS 08/18/2009  . Dyslipidemia 06/30/2009  . Depression 06/30/2009  . Essential hypertension 06/30/2009  . ALLERGIC RHINITIS 06/30/2009  . COLLAGENOUS COLITIS 04/08/2009      Clarita Crane, PT, DPT 04/03/17 12:11 PM    [facility] [address] [place], [place], [address] Phone: [phone]   Fax:  [phone]  Name: JADAN ROUILLARD MRN: [id number] Date of Birth: [birth year]

## 2017-04-06 ENCOUNTER — Ambulatory Visit (INDEPENDENT_AMBULATORY_CARE_PROVIDER_SITE_OTHER): Payer: Medicare Other | Admitting: Physical Therapy

## 2017-04-06 DIAGNOSIS — M25551 Pain in right hip: Secondary | ICD-10-CM | POA: Diagnosis not present

## 2017-04-06 DIAGNOSIS — M6281 Muscle weakness (generalized): Secondary | ICD-10-CM

## 2017-04-06 DIAGNOSIS — M25552 Pain in left hip: Secondary | ICD-10-CM

## 2017-04-06 NOTE — Therapy (Signed)
[place] [address] [place], [place], [address] Phone: [phone]   Fax:  [phone]  Physical Therapy Treatment  Patient Details  Name: Erika Gibson MRN: [id number] Date of Birth: 10-25-1928 Referring Provider: Dr. Charlann Boxer  Encounter Date: 04/06/2017      PT End of Session - 04/06/17 1253    Visit Number 2   Number of Visits 12   Date for PT Re-Evaluation 05/15/17   Authorization Type Medicare   PT Start Time 1100   PT Stop Time 1143   PT Time Calculation (min) 43 min   Activity Tolerance Patient tolerated treatment well   Behavior During Therapy [facility] for tasks assessed/performed      Past Medical History:  Diagnosis Date  . Anxiety state, unspecified   . ASTHMA   . Colitis, collagenous 2010  . COLONIC POLYPS, HX OF 06/2002   adenomatous polyp  . DEPRESSION   . HYPERLIPIDEMIA   . HYPERTENSION   . Nephrolithiasis   . Osteopenia     Past Surgical History:  Procedure Laterality Date  . ANKLE SURGERY     removed pins from ankle bone  . BREAST SURGERY     Implants  . DILATION AND CURETTAGE OF UTERUS    . TONSILLECTOMY      There were no vitals filed for this visit.      Subjective Assessment - 04/06/17 1104    Subjective doing well; did some gardening and that went "okay."   Patient Stated Goals improve pain   Pain Score 0-No pain                         [facility] Adult PT Treatment/Exercise - 04/06/17 1106      Lumbar Exercises: Stretches   Single Knee to Chest Stretch 2 reps;30 seconds   Single Knee to Chest Stretch Limitations bil   Piriformis Stretch 2 reps;30 seconds   Piriformis Stretch Limitations bil     Knee/Hip Exercises: Aerobic   Recumbent Bike L2 x 6 min     Knee/Hip Exercises: Seated   Sit to Sand 10 reps;without UE support  with cues for technique     Knee/Hip Exercises: Supine   Bridges Both;10 reps   Straight Leg Raises Both;10 reps     Knee/Hip Exercises:  Sidelying   Hip ABduction Both;10 reps     Modalities   Modalities Iontophoresis     Iontophoresis   Type of Iontophoresis Dexamethasone   Location Lt hip   Dose 1.0 cc   Time 6 hour patch                PT Education - 04/06/17 1253    Education provided Yes   Education Details ionto    Person(s) Educated Patient   Methods Explanation;Handout   Comprehension Verbalized understanding             PT Long Term Goals - 04/03/17 1206      PT LONG TERM GOAL #1   Title independent with HEP (05/15/17)   Time 6   Period Weeks   Status New     PT LONG TERM GOAL #2   Title report ability to walk at least 1 mile with pain < 3/10 for improved function and return to regular exercise (05/15/17)   Time 6   Period Weeks   Status New     PT LONG TERM GOAL #3   Title demonstrate at least  4/5 strength in bil hips for improved functional mobility and tolerance to exercise (05/15/17)   Time 6   Period Weeks   Status New     PT LONG TERM GOAL #4   Title report ability to perform yardwork with pain < 4/10 for improved functional mobility (05/15/17)   Time 6   Period Weeks   Status New               Plan - 04/06/17 1253    Clinical Impression Statement Pt reports compliance with HEP, and needed min cues today for proper technique.  No goals met as only second visit.  Will plan to progress exercises next session and assess response to ionto, which was applied to Lt hip today.  Will continue to benefit from PT to maximize function.   PT Treatment/Interventions ADLs/Self Care Home Management;Cryotherapy;Electrical Stimulation;Iontophoresis 95m/ml Dexamethasone;Moist Heat;Ultrasound;Therapeutic exercise;Therapeutic activities;Functional mobility training;Patient/family education;Manual techniques;Taping;Dry needling   PT Next Visit Plan assess response to ionto, add standing hip exercises, modalities and ionto PRN    Consulted and Agree with Plan of Care Patient      Patient  will benefit from skilled therapeutic intervention in order to improve the following deficits and impairments:  Decreased cognition, Pain, Decreased strength, Decreased mobility  Visit Diagnosis: Pain in left hip  Pain in right hip  Muscle weakness (generalized)     Problem List Patient Active Problem List   Diagnosis Date Noted  . Greater trochanteric bursitis of both hips 02/27/2017  . Bilateral hip pain 02/11/2017  . Abnormal electrocardiogram 03/11/2016  . Chest pain 03/11/2016  . Hammertoe 01/04/2016  . Osteopenia   . Allergic reaction to bee sting 06/21/2012  . CKD (chronic kidney disease) 03/06/2012  . SI (sacroiliac) pain 03/11/2011  . Insomnia 03/11/2011  . POSTMENOPAUSAL SYNDROME 02/10/2010  . Anxiety state 10/14/2009  . RENAL CALCULUS 08/18/2009  . Dyslipidemia 06/30/2009  . Depression 06/30/2009  . Essential hypertension 06/30/2009  . ALLERGIC RHINITIS 06/30/2009  . COLLAGENOUS COLITIS 04/08/2009      SLaureen Abrahams PT, DPT 04/06/17 12:56 PM    [place] [address]Phone: [phone]  Fax:  [phone] Name: Erika SCHLOTTERMRN: [id number] of Birth: [birth year]

## 2017-04-06 NOTE — Patient Instructions (Signed)

## 2017-04-07 ENCOUNTER — Ambulatory Visit (INDEPENDENT_AMBULATORY_CARE_PROVIDER_SITE_OTHER): Payer: Medicare Other | Admitting: Podiatry

## 2017-04-07 ENCOUNTER — Encounter: Payer: Self-pay | Admitting: Podiatry

## 2017-04-07 DIAGNOSIS — Q828 Other specified congenital malformations of skin: Secondary | ICD-10-CM

## 2017-04-07 DIAGNOSIS — B351 Tinea unguium: Secondary | ICD-10-CM

## 2017-04-07 DIAGNOSIS — M79676 Pain in unspecified toe(s): Secondary | ICD-10-CM

## 2017-04-07 NOTE — Progress Notes (Signed)
Patient ID: Erika Gibson, female   DOB: 12/18/1927, 83 y.o.   MRN: [id number] Complaint:  Visit Type: Patient returns to my office for continued preventative foot care services. Complaint: Patient states" my nails have grown long and thick and become painful to walk and wear shoes" . The patient presents for preventative foot care services. No changes to ROS  Podiatric Exam: Vascular: dorsalis pedis and posterior tibial pulses are palpable bilateral. Capillary return is immediate. Temperature gradient is WNL. Skin turgor WNL . Varicosities noted feet B/L Sensorium: Normal Semmes Weinstein monofilament test. Normal tactile sensation bilaterally. Nail Exam: Pt has thick disfigured discolored nails with subungual debris noted bilateral entire nail hallux through fifth toenails Ulcer Exam: There is no evidence of ulcer or pre-ulcerative changes or infection. Orthopedic Exam: Muscle tone and strength are WNL. No limitations in general ROM. No crepitus or effusions noted. Hammer toes 2-5  B/L Skin:  Porokeratosis sub 3 right No infection or ulcers  Diagnosis:  Onychomycosis, , Pain in right toe, pain in left toes  Treatment & Plan Procedures and Treatment: Consent by patient was obtained for treatment procedures. The patient understood the discussion of treatment and procedures well. All questions were answered thoroughly reviewed. Debridement of mycotic and hypertrophic toenails, 1 through 5 bilateral and clearing of subungual debris. No ulceration, no infection noted.   Debride porokeratosis. Return Visit-Office Procedure: Patient instructed to return to the office for a follow up visit 3 months for continued evaluation and treatment.    Trevaughn Schear DPM 

## 2017-04-10 ENCOUNTER — Ambulatory Visit (INDEPENDENT_AMBULATORY_CARE_PROVIDER_SITE_OTHER): Payer: Medicare Other | Admitting: Physical Therapy

## 2017-04-10 DIAGNOSIS — M25551 Pain in right hip: Secondary | ICD-10-CM

## 2017-04-10 DIAGNOSIS — M25552 Pain in left hip: Secondary | ICD-10-CM | POA: Diagnosis not present

## 2017-04-10 DIAGNOSIS — M6281 Muscle weakness (generalized): Secondary | ICD-10-CM | POA: Diagnosis not present

## 2017-04-10 NOTE — Therapy (Signed)
[facility] [address] [place], [place], [address] Phone: [phone]   Fax:  [phone]  Physical Therapy Treatment  Patient Details  Name: Erika Gibson MRN: [id number] Date of Birth: 05/10/1928 Referring Provider: Dr. Terrilee Files  Encounter Date: 04/10/2017      PT End of Session - 04/10/17 1305    Visit Number 3   Number of Visits 12   Date for PT Re-Evaluation 05/15/17   Authorization Type Medicare   PT Start Time 1300   PT Stop Time 1344   PT Time Calculation (min) 44 min   Activity Tolerance Patient tolerated treatment well;No increased pain   Behavior During Therapy [facility] for tasks assessed/performed      Past Medical History:  Diagnosis Date  . Anxiety state, unspecified   . ASTHMA   . Colitis, collagenous 2010  . COLONIC POLYPS, HX OF 06/2002   adenomatous polyp  . DEPRESSION   . HYPERLIPIDEMIA   . HYPERTENSION   . Nephrolithiasis   . Osteopenia   . Substance abuse     Past Surgical History:  Procedure Laterality Date  . ANKLE SURGERY     removed pins from ankle bone  . BREAST SURGERY     Implants  . DILATION AND CURETTAGE OF UTERUS    . TONSILLECTOMY      There were no vitals filed for this visit.      Subjective Assessment - 04/10/17 1303    Subjective hips are "okay."  completely forgot about patch.  had some tenderness Rt ischial tuberosity.  was able to stand and bake cookies on Sunday without pain   Patient Stated Goals improve pain   Pain Score 0-No pain                         [facility] Adult PT Treatment/Exercise - 04/10/17 1306      Knee/Hip Exercises: Stretches   Passive Hamstring Stretch 3 reps;30 seconds;Both   Passive Hamstring Stretch Limitations with full knee extension and with knee flexed     Knee/Hip Exercises: Aerobic   Recumbent Bike L2 x 6 min     Knee/Hip Exercises: Standing   Hip Abduction Both;Knee straight;10 reps   Abduction Limitations 2#; mod cues  for proper form   Hip Extension Both;10 reps;Knee straight   Extension Limitations 2#     Knee/Hip Exercises: Seated   Other Seated Knee/Hip Exercises ir/er with red theraband x 10 reps each; bil   Sit to Sand 10 reps;without UE support  on compliant surface; increased cues needed     Knee/Hip Exercises: Supine   Bridges Limitations bridging with march x 5 reps bil                     PT Long Term Goals - 04/03/17 1206      PT LONG TERM GOAL #1   Title independent with HEP (05/15/17)   Time 6   Period Weeks   Status New     PT LONG TERM GOAL #2   Title report ability to walk at least 1 mile with pain < 3/10 for improved function and return to regular exercise (05/15/17)   Time 6   Period Weeks   Status New     PT LONG TERM GOAL #3   Title demonstrate at least 4/5 strength in bil hips for improved functional mobility and tolerance to exercise (05/15/17)   Time 6  Period Weeks   Status New     PT LONG TERM GOAL #4   Title report ability to perform yardwork with pain < 4/10 for improved functional mobility (05/15/17)   Time 6   Period Weeks   Status New               Plan - 04/10/17 1459    Clinical Impression Statement Pt reports she completely forgot about the ionto patch, and didn't feel any difference after.  Pt does report some improvement in function and pain, stating she is doing yardwork for up to 2 hours, and baking cookies without pain.  Pain is still elevated after yardwork and rated today a 7/10.  Will continue to benefit from PT to maximize function and decrease pain.   PT Treatment/Interventions ADLs/Self Care Home Management;Cryotherapy;Electrical Stimulation;Iontophoresis /ml Dexamethasone;Moist Heat;Ultrasound;Therapeutic exercise;Therapeutic activities;Functional mobility training;Patient/family education;Manual techniques;Taping;Dry needling   PT Next Visit Plan add standing hip exercises, continue strengthening/stretching, modalities and  ionto PRN    Consulted and Agree with Plan of Care Patient      Patient will benefit from skilled therapeutic intervention in order to improve the following deficits and impairments:  Decreased cognition, Pain, Decreased strength, Decreased mobility  Visit Diagnosis: Pain in left hip  Pain in right hip  Muscle weakness (generalized)     Problem List Patient Active Problem List   Diagnosis Date Noted  . Greater trochanteric bursitis of both hips 02/27/2017  . Bilateral hip pain 02/11/2017  . Abnormal electrocardiogram 03/11/2016  . Chest pain 03/11/2016  . Hammertoe 01/04/2016  . Osteopenia   . Allergic reaction to bee sting 06/21/2012  . CKD (chronic kidney disease) 03/06/2012  . SI (sacroiliac) pain 03/11/2011  . Insomnia 03/11/2011  . POSTMENOPAUSAL SYNDROME 02/10/2010  . Anxiety state 10/14/2009  . RENAL CALCULUS 08/18/2009  . Dyslipidemia 06/30/2009  . Depression 06/30/2009  . Essential hypertension 06/30/2009  . ALLERGIC RHINITIS 06/30/2009  . COLLAGENOUS COLITIS 04/08/2009       Clarita Crane, PT, DPT 04/10/17 3:02 PM    [facility] [address] [place], [place], [address] Phone: [phone]   Fax:  [phone]  Name: Erika Gibson MRN: [id number] Date of Birth: [birth year]

## 2017-04-13 ENCOUNTER — Ambulatory Visit (INDEPENDENT_AMBULATORY_CARE_PROVIDER_SITE_OTHER): Payer: Medicare Other | Admitting: Physical Therapy

## 2017-04-13 DIAGNOSIS — M25551 Pain in right hip: Secondary | ICD-10-CM | POA: Diagnosis not present

## 2017-04-13 DIAGNOSIS — M6281 Muscle weakness (generalized): Secondary | ICD-10-CM

## 2017-04-13 DIAGNOSIS — M25552 Pain in left hip: Secondary | ICD-10-CM

## 2017-04-13 NOTE — Therapy (Signed)
[facility] [address] [place], [place], [address] Phone: [phone]   Fax:  [phone]  Physical Therapy Treatment  Patient Details  Name: Erika Gibson MRN: [id number] Date of Birth: [birth year] Referring Provider: Dr. Terrilee Files  Encounter Date: 04/13/2017      PT End of Session - 04/13/17 1148    Visit Number 4   Number of Visits 12   Date for PT Re-Evaluation 05/15/17   Authorization Type Medicare   PT Start Time 1101   PT Stop Time 1143   PT Time Calculation (min) 42 min   Activity Tolerance Patient tolerated treatment well   Behavior During Therapy [facility] for tasks assessed/performed      Past Medical History:  Diagnosis Date  . Anxiety state, unspecified   . ASTHMA   . Colitis, collagenous 2010  . COLONIC POLYPS, HX OF 06/2002   adenomatous polyp  . DEPRESSION   . HYPERLIPIDEMIA   . HYPERTENSION   . Nephrolithiasis   . Osteopenia   . Substance abuse     Past Surgical History:  Procedure Laterality Date  . ANKLE SURGERY     removed pins from ankle bone  . BREAST SURGERY     Implants  . DILATION AND CURETTAGE OF UTERUS    . TONSILLECTOMY      There were no vitals filed for this visit.      Subjective Assessment - 04/13/17 1104    Subjective hips feel "some better." no pain right now.  has had some diarrhea since last session, so "I've been out of commission."   Patient Stated Goals improve pain   Currently in Pain? No/denies                         [facility] Adult PT Treatment/Exercise - 04/13/17 1105      Knee/Hip Exercises: Aerobic   Recumbent Bike L2 x 6 min     Knee/Hip Exercises: Standing   Heel Raises Both;20 reps   Heel Raises Limitations toe raises x 20 reps   Lateral Step Up Both;10 reps;Hand Hold: 2  onto BOSU   Forward Step Up Both;10 reps;Hand Hold: 1  onto BOSU     Knee/Hip Exercises: Seated   Clamshell with TheraBand Red  x15 reps   Hamstring Curl Both;15 reps   Hamstring Limitations red theraband   Sit to Sand 10 reps;without UE support     Knee/Hip Exercises: Supine   Other Supine Knee/Hip Exercises prone ir/er with red theraband x 10 bil; mod facilitation for correct technique     Knee/Hip Exercises: Prone   Straight Leg Raises Both;10 reps                     PT Long Term Goals - 04/03/17 1206      PT LONG TERM GOAL #1   Title independent with HEP (05/15/17)   Time 6   Period Weeks   Status New     PT LONG TERM GOAL #2   Title report ability to walk at least 1 mile with pain < 3/10 for improved function and return to regular exercise (05/15/17)   Time 6   Period Weeks   Status New     PT LONG TERM GOAL #3   Title demonstrate at least 4/5 strength in bil hips for improved functional mobility and tolerance to exercise (05/15/17)   Time 6   Period Weeks   Status  New     PT LONG TERM GOAL #4   Title report ability to perform yardwork with pain < 4/10 for improved functional mobility (05/15/17)   Time 6   Period Weeks   Status New               Plan - 04/13/17 1148    Clinical Impression Statement Pt tolerates sessions well without c/o increase in pain.  Instructed to perform yard work this weekend (weather permitting) and to report back next week about pain.  Continues to perform well with PT, and pt reporting subjective improvement in pain.   PT Treatment/Interventions ADLs/Self Care Home Management;Cryotherapy;Electrical Stimulation;Iontophoresis 4mg /ml Dexamethasone;Moist Heat;Ultrasound;Therapeutic exercise;Therapeutic activities;Functional mobility training;Patient/family education;Manual techniques;Taping;Dry needling   PT Next Visit Plan add standing hip exercises, continue strengthening/stretching, modalities and ionto PRN, manual (? distraction and ball work)   Financial plannerConsulted and Agree with Plan of Care Patient      Patient will benefit from skilled therapeutic intervention in order to improve the following  deficits and impairments:  Decreased cognition, Pain, Decreased strength, Decreased mobility  Visit Diagnosis: Pain in left hip  Pain in right hip  Muscle weakness (generalized)     Problem List Patient Active Problem List   Diagnosis Date Noted  . Greater trochanteric bursitis of both hips 02/27/2017  . Bilateral hip pain 02/11/2017  . Abnormal electrocardiogram 03/11/2016  . Chest pain 03/11/2016  . Hammertoe 01/04/2016  . Osteopenia   . Allergic reaction to bee sting 06/21/2012  . CKD (chronic kidney disease) 03/06/2012  . SI (sacroiliac) pain 03/11/2011  . Insomnia 03/11/2011  . POSTMENOPAUSAL SYNDROME 02/10/2010  . Anxiety state 10/14/2009  . RENAL CALCULUS 08/18/2009  . Dyslipidemia 06/30/2009  . Depression 06/30/2009  . Essential hypertension 06/30/2009  . ALLERGIC RHINITIS 06/30/2009  . COLLAGENOUS COLITIS 04/08/2009      Clarita CraneStephanie F Waunetta Riggle, PT, DPT 04/13/17 11:51 AM    [facility] [address] [place], [place], [address] Phone: [phone]   Fax:  [phone]  Name: Erika Gibson MRN: [id number] Date of Birth: [birth year]

## 2017-04-14 ENCOUNTER — Telehealth: Payer: Self-pay | Admitting: Internal Medicine

## 2017-04-14 NOTE — Telephone Encounter (Signed)
Pt came in and states she has had diarrhea for a week and a lot of gas.  Believes it may be because of the Celexa 20mg .   She has been taking 10mg .  Had PT on thursday and went home and was planting and could not get up she could not bear her own weight.   Made appt for 5/8

## 2017-04-14 NOTE — Telephone Encounter (Signed)
Pt has been informed and expressed understanding.  

## 2017-04-14 NOTE — Telephone Encounter (Signed)
Most likely not the celexa since this is not a common side effect  OK to see at OV

## 2017-04-17 ENCOUNTER — Ambulatory Visit (INDEPENDENT_AMBULATORY_CARE_PROVIDER_SITE_OTHER): Payer: Medicare Other | Admitting: Physical Therapy

## 2017-04-17 DIAGNOSIS — M25551 Pain in right hip: Secondary | ICD-10-CM

## 2017-04-17 DIAGNOSIS — M6281 Muscle weakness (generalized): Secondary | ICD-10-CM | POA: Diagnosis not present

## 2017-04-17 DIAGNOSIS — M25552 Pain in left hip: Secondary | ICD-10-CM | POA: Diagnosis not present

## 2017-04-17 NOTE — Therapy (Signed)
[facility] [address] [place], [place], [address] Phone: [phone]   Fax:  [phone]  Physical Therapy Treatment  Patient Details  Name: Erika Gibson MRN: [id number] Date of Birth: Aug 29, 1928 Referring Provider: Dr. Terrilee Files  Encounter Date: 04/17/2017      PT End of Session - 04/17/17 1340    Visit Number 5   Number of Visits 12   Date for PT Re-Evaluation 05/15/17   Authorization Type Medicare   PT Start Time 1258   PT Stop Time 1340   PT Time Calculation (min) 42 min   Activity Tolerance Patient tolerated treatment well   Behavior During Therapy [facility] for tasks assessed/performed      Past Medical History:  Diagnosis Date  . Anxiety state, unspecified   . ASTHMA   . Colitis, collagenous 2010  . COLONIC POLYPS, HX OF 06/2002   adenomatous polyp  . DEPRESSION   . HYPERLIPIDEMIA   . HYPERTENSION   . Nephrolithiasis   . Osteopenia   . Substance abuse     Past Surgical History:  Procedure Laterality Date  . ANKLE SURGERY     removed pins from ankle bone  . BREAST SURGERY     Implants  . DILATION AND CURETTAGE OF UTERUS    . TONSILLECTOMY      There were no vitals filed for this visit.      Subjective Assessment - 04/17/17 1258    Subjective GI issues seem to be resolved; has follow up with MD tomorrow. Working out in yard Thursday after session, sat down and was then unable to get up.  Son was present to assist pt up.  Pt states "my legs went numb and I had no strength."  Also, noticed "clicking" in bil hips which is now resolved and no pain with symptom.  Hip pain is improving overall.   Pertinent History memory   Patient Stated Goals improve pain   Currently in Pain? No/denies                         [facility] Adult PT Treatment/Exercise - 04/17/17 1305      Knee/Hip Exercises: Aerobic   Recumbent Bike L2 x 6 min     Knee/Hip Exercises: Standing   Heel Raises Both;20 reps   Heel  Raises Limitations toe raises x 20 reps   Hip Flexion Both;2 sets;10 reps;Knee bent   Hip Flexion Limitations 3#   Hip Abduction Both;2 sets;10 reps;Knee straight   Abduction Limitations 3#   Hip Extension Both;2 sets;10 reps;Knee straight   Extension Limitations 3#   Walking with Sports Cord resisted lateral and backwards walking 6' x 3 each direction     Knee/Hip Exercises: Supine   Bridges Both;10 reps   Bridges Limitations with orange physioball     Knee/Hip Exercises: Sidelying   Clams x10 bil with red theraband                     PT Long Term Goals - 04/03/17 1206      PT LONG TERM GOAL #1   Title independent with HEP (05/15/17)   Time 6   Period Weeks   Status New     PT LONG TERM GOAL #2   Title report ability to walk at least 1 mile with pain < 3/10 for improved function and return to regular exercise (05/15/17)   Time 6   Period Weeks  Status New     PT LONG TERM GOAL #3   Title demonstrate at least 4/5 strength in bil hips for improved functional mobility and tolerance to exercise (05/15/17)   Time 6   Period Weeks   Status New     PT LONG TERM GOAL #4   Title report ability to perform yardwork with pain < 4/10 for improved functional mobility (05/15/17)   Time 6   Period Weeks   Status New               Plan - 04/17/17 1340    Clinical Impression Statement Pt continues to do well with PT, and reports improvement in pain overall.  Pt with sudden onset of numbness and weakness in bil LEs after sitting and doing yardwork; which resolved with assist of son and pt in standing.  Advised pt to discuss with MD at next visit, and monitored for symptoms today.  Anticipate likely d/c next week as pt doing well.   PT Treatment/Interventions ADLs/Self Care Home Management;Cryotherapy;Electrical Stimulation;Iontophoresis 4mg /ml Dexamethasone;Moist Heat;Ultrasound;Therapeutic exercise;Therapeutic activities;Functional mobility training;Patient/family  education;Manual techniques;Taping;Dry needling   PT Next Visit Plan add standing hip exercises to HEP, continue strengthening/stretching, modalities and ionto PRN, manual (? distraction and ball work)   Financial plannerConsulted and Agree with Plan of Care Patient      Patient will benefit from skilled therapeutic intervention in order to improve the following deficits and impairments:  Decreased cognition, Pain, Decreased strength, Decreased mobility  Visit Diagnosis: Pain in left hip  Pain in right hip  Muscle weakness (generalized)     Problem List Patient Active Problem List   Diagnosis Date Noted  . Greater trochanteric bursitis of both hips 02/27/2017  . Bilateral hip pain 02/11/2017  . Abnormal electrocardiogram 03/11/2016  . Chest pain 03/11/2016  . Hammertoe 01/04/2016  . Osteopenia   . Allergic reaction to bee sting 06/21/2012  . CKD (chronic kidney disease) 03/06/2012  . SI (sacroiliac) pain 03/11/2011  . Insomnia 03/11/2011  . POSTMENOPAUSAL SYNDROME 02/10/2010  . Anxiety state 10/14/2009  . RENAL CALCULUS 08/18/2009  . Dyslipidemia 06/30/2009  . Depression 06/30/2009  . Essential hypertension 06/30/2009  . ALLERGIC RHINITIS 06/30/2009  . COLLAGENOUS COLITIS 04/08/2009      Clarita CraneStephanie F Rogan Ecklund, PT, DPT 04/17/17 1:42 PM     [facility] [address] [place], [place], [address] Phone: [phone]   Fax:  [phone]  Name: Hiram GashBetty J Rudman MRN: [id number] Date of Birth: July 27, 1928

## 2017-04-18 ENCOUNTER — Encounter: Payer: Self-pay | Admitting: Internal Medicine

## 2017-04-18 ENCOUNTER — Other Ambulatory Visit (INDEPENDENT_AMBULATORY_CARE_PROVIDER_SITE_OTHER): Payer: Medicare Other

## 2017-04-18 ENCOUNTER — Ambulatory Visit (INDEPENDENT_AMBULATORY_CARE_PROVIDER_SITE_OTHER): Payer: Medicare Other | Admitting: Internal Medicine

## 2017-04-18 ENCOUNTER — Telehealth: Payer: Self-pay

## 2017-04-18 VITALS — BP 124/78 | HR 72 | Ht 64.0 in | Wt 114.0 lb

## 2017-04-18 DIAGNOSIS — F329 Major depressive disorder, single episode, unspecified: Secondary | ICD-10-CM | POA: Diagnosis not present

## 2017-04-18 DIAGNOSIS — I10 Essential (primary) hypertension: Secondary | ICD-10-CM | POA: Diagnosis not present

## 2017-04-18 DIAGNOSIS — R197 Diarrhea, unspecified: Secondary | ICD-10-CM

## 2017-04-18 DIAGNOSIS — N189 Chronic kidney disease, unspecified: Secondary | ICD-10-CM | POA: Diagnosis not present

## 2017-04-18 DIAGNOSIS — F32A Depression, unspecified: Secondary | ICD-10-CM

## 2017-04-18 LAB — CBC WITH DIFFERENTIAL/PLATELET
BASOS PCT: 1.1 % (ref 0.0–3.0)
Basophils Absolute: 0.1 10*3/uL (ref 0.0–0.1)
EOS PCT: 2.2 % (ref 0.0–5.0)
Eosinophils Absolute: 0.1 10*3/uL (ref 0.0–0.7)
HCT: 40.1 % (ref 36.0–46.0)
HEMOGLOBIN: 13.4 g/dL (ref 12.0–15.0)
LYMPHS ABS: 1.5 10*3/uL (ref 0.7–4.0)
LYMPHS PCT: 22.6 % (ref 12.0–46.0)
MCHC: 33.5 g/dL (ref 30.0–36.0)
MCV: 92.6 fl (ref 78.0–100.0)
MONOS PCT: 9.5 % (ref 3.0–12.0)
Monocytes Absolute: 0.6 10*3/uL (ref 0.1–1.0)
NEUTROS PCT: 64.6 % (ref 43.0–77.0)
Neutro Abs: 4.3 10*3/uL (ref 1.4–7.7)
Platelets: 236 10*3/uL (ref 150.0–400.0)
RBC: 4.33 Mil/uL (ref 3.87–5.11)
RDW: 13.8 % (ref 11.5–15.5)
WBC: 6.7 10*3/uL (ref 4.0–10.5)

## 2017-04-18 LAB — BASIC METABOLIC PANEL
BUN: 23 mg/dL (ref 6–23)
CALCIUM: 9.9 mg/dL (ref 8.4–10.5)
CO2: 29 meq/L (ref 19–32)
CREATININE: 1.15 mg/dL (ref 0.40–1.20)
Chloride: 106 mEq/L (ref 96–112)
GFR: 47.47 mL/min — ABNORMAL LOW (ref >60.00)
GLUCOSE: 88 mg/dL (ref 70–99)
Potassium: 4.7 mEq/L (ref 3.5–5.1)
Sodium: 140 mEq/L (ref 135–145)

## 2017-04-18 MED ORDER — CITALOPRAM HYDROBROMIDE 20 MG PO TABS: 10.0000 mg | ORAL_TABLET | Freq: Every day | ORAL | 3 refills | Status: DC

## 2017-04-18 NOTE — Progress Notes (Signed)
Pre visit review using our clinic review tool, if applicable. No additional management support is needed unless otherwise documented below in the visit note. 

## 2017-04-18 NOTE — Telephone Encounter (Signed)
Left verbal msg with a female who answer phone for pt to give me a call to discuss results.

## 2017-04-18 NOTE — Patient Instructions (Signed)
OK to continue the celexa at 10 mg per day as you are doing  Please continue all other medications as before, and refills have been done if requested.  Please have the pharmacy call with any other refills you may need.  Please keep your appointments with your specialists as you may have planned  Please go to the LAB in the Basement (turn left off the elevator) for the tests to be done today  You will be contacted by phone if any changes need to be made immediately.  Otherwise, you will receive a letter about your results with an explanation, but please check with MyChart first.  Please remember to sign up for MyChart if you have not done so, as this will be important to you in the future with finding out test results, communicating by private email, and scheduling acute appointments online when needed.

## 2017-04-18 NOTE — Telephone Encounter (Signed)
-----   Message from [name], MD sent at 04/18/2017 12:35 PM EDT ----- Left message on MyChart, pt to cont same tx  OK for Erika Gibson to let pt know - labs ok and she not have anemia

## 2017-04-18 NOTE — Progress Notes (Signed)
Subjective:    Patient ID: Erika Gibson, female    DOB: September 21, 1928, 83 y.o.   MRN: [id number]  HPI  Here to f/u with c/o start freqeunt loose and watery stools over the past month since began the increased celexa 20 mg, then reduced and now essentially resolved since went back to the 10 mg dosing. Has overall good med compliance including the diuretic and experienced some weakness and dizzy with moving too fast during this time.  Also had several instances when the stool seemed black, but did not seek medical attention at that time.  Denies worsening reflux, abd pain, dysphagia, n/v.  Did not stop the diuretic as doing this before led to worsening LLE swelling.  No current dizziness or weakness.   Pt denies fever, wt loss, night sweats, loss of appetite, or other constitutional symptoms  Did have an episode of sitting on the ground working in the yard where her legs fell asleep and had to have some get her up and stand up for several minutes before she could move and feel her legs well.   Past Medical History:  Diagnosis Date  . Anxiety state, unspecified   . ASTHMA   . Colitis, collagenous 2010  . COLONIC POLYPS, HX OF 06/2002   adenomatous polyp  . DEPRESSION   . HYPERLIPIDEMIA   . HYPERTENSION   . Nephrolithiasis   . Osteopenia   . Substance abuse    Past Surgical History:  Procedure Laterality Date  . ANKLE SURGERY     removed pins from ankle bone  . BREAST SURGERY     Implants  . DILATION AND CURETTAGE OF UTERUS    . TONSILLECTOMY      reports that she has quit smoking. She has never used smokeless tobacco. She reports that she does not drink alcohol or use drugs. family history includes Alzheimer's disease in her father, mother, and other; Breast cancer in her paternal aunt; Colon polyps in her brother; Heart disease in her father. Allergies  Allergen Reactions  . Mesalamine     REACTION: fuzzy headed and unsteady   Current Outpatient Prescriptions on File Prior to Visit    Medication Sig Dispense Refill  . aspirin EC 81 MG tablet Take 81 mg by mouth daily.    Marland Kitchen atorvastatin (LIPITOR) 10 MG tablet Take 10 mg by mouth daily.      . Cholecalciferol (VITAMIN D PO) Take by mouth.    Marland Kitchen lisinopril (PRINIVIL,ZESTRIL) 10 MG tablet Take 10 mg by mouth daily.      . Multiple Vitamin (MULTIVITAMIN) tablet Take 1 tablet by mouth daily.      . Probiotic Product (PROBIOTIC DAILY PO) Take 1 tablet by mouth daily.    Marland Kitchen triamterene-hydrochlorothiazide (MAXZIDE-25) 37.5-25 MG per tablet Take 1 tablet by mouth daily.      . Turmeric 500 MG CAPS Take 1 capsule by mouth daily.     No current facility-administered medications on file prior to visit.    Review of Systems  Constitutional: Negative for other unusual diaphoresis or sweats HENT: Negative for ear discharge or swelling Eyes: Negative for other worsening visual disturbances Respiratory: Negative for stridor or other swelling  Gastrointestinal: Negative for worsening distension or other blood Genitourinary: Negative for retention or other urinary change Musculoskeletal: Negative for other MSK pain or swelling Skin: Negative for color change or other new lesions Neurological: Negative for worsening tremors and other numbness  Psychiatric/Behavioral: Negative for worsening agitation or other fatigue All  other system neg per pt    Objective:   Physical Exam BP 124/78   Pulse 72   Ht 5\' 4"  (1.626 m)   Wt 114 lb (51.7 kg)   SpO2 96%   BMI 19.57 kg/m  VS noted,  Constitutional: Pt appears in NAD HENT: Head: NCAT.  Right Ear: External ear normal.  Left Ear: External ear normal.  Eyes: . Pupils are equal, round, and reactive to light. Conjunctivae and EOM are normal Nose: without d/c or deformity Neck: Neck supple. Gross normal ROM Cardiovascular: Normal rate and regular rhythm.   Pulmonary/Chest: Effort normal and breath sounds without rales or wheezing.  Abd:  Soft, NT, ND, + BS, no organomegaly, no flank  tender - benign exam Neurological: Pt is alert. At baseline orientation, motor grossly intact Skin: Skin is warm. No rashes, other new lesions, no LE edema Psychiatric: Pt behavior is normal without agitation Mild nervous No other exam findings   Lab Results  Component Value Date   WBC 9.0 02/10/2017   HGB 13.7 02/10/2017   HCT 40.5 02/10/2017   PLT 264.0 02/10/2017   GLUCOSE 83 02/10/2017   CHOL 160 02/10/2017   TRIG 99.0 02/10/2017   HDL 59.20 02/10/2017   LDLCALC 81 02/10/2017   ALT 17 02/10/2017   AST 19 02/10/2017   NA 140 02/10/2017   K 4.4 02/10/2017   CL 104 02/10/2017   CREATININE 1.38 (H) 02/10/2017   BUN 29 (H) 02/10/2017   CO2 27 02/10/2017   TSH 3.93 02/10/2017   HGBA1C 5.8 02/10/2017       Assessment & Plan:

## 2017-04-18 NOTE — Assessment & Plan Note (Signed)
By hx does seem related to increased celexa as now improved with reduction dose back to 10 mg; now essentially resolved symptoms and exam benign, will hold on further testing except for BMP and cbc assoc with recent dark stools and dizziness

## 2017-04-18 NOTE — Assessment & Plan Note (Signed)
stable overall by history and exam, recent data reviewed with pt, and pt to continue medical treatment as before,  to f/u any worsening symptoms or concerns Lab Results  Component Value Date   CREATININE 1.38 (H) 02/10/2017   For f/u today given recent symptoms

## 2017-04-18 NOTE — Assessment & Plan Note (Signed)
Chronic persistent, will hold on further med changes at this time, declins counseling referral

## 2017-04-18 NOTE — Assessment & Plan Note (Signed)
stable overall by history and exam, recent data reviewed with pt, and pt to continue medical treatment as before,  to f/u any worsening symptoms or concerns BP Readings from Last 3 Encounters:  04/18/17 124/78  03/31/17 (!) 150/82  03/27/17 108/62

## 2017-04-20 ENCOUNTER — Ambulatory Visit (INDEPENDENT_AMBULATORY_CARE_PROVIDER_SITE_OTHER): Payer: Medicare Other | Admitting: Physical Therapy

## 2017-04-20 DIAGNOSIS — M6281 Muscle weakness (generalized): Secondary | ICD-10-CM | POA: Diagnosis not present

## 2017-04-20 DIAGNOSIS — M25551 Pain in right hip: Secondary | ICD-10-CM | POA: Diagnosis not present

## 2017-04-20 DIAGNOSIS — M25552 Pain in left hip: Secondary | ICD-10-CM | POA: Diagnosis not present

## 2017-04-20 NOTE — Therapy (Addendum)
[facility] [address] [place], [place], [address] Phone: [phone]   Fax:  [phone]  Physical Therapy Treatment  Patient Details  Name: Erika Gibson MRN: [id number] Date of Birth: 06/13/1928 Referring Provider: Dr. Charlann Boxer  Encounter Date: 04/20/2017      PT End of Session - 04/20/17 1143    Visit Number 6   Number of Visits 12   Date for PT Re-Evaluation 05/15/17   Authorization Type Medicare   PT Start Time 1101   PT Stop Time 1140   PT Time Calculation (min) 39 min   Activity Tolerance Patient tolerated treatment well   Behavior During Therapy [facility] for tasks assessed/performed      Past Medical History:  Diagnosis Date  . Anxiety state, unspecified   . ASTHMA   . Colitis, collagenous 2010  . COLONIC POLYPS, HX OF 06/2002   adenomatous polyp  . DEPRESSION   . HYPERLIPIDEMIA   . HYPERTENSION   . Nephrolithiasis   . Osteopenia   . Substance abuse     Past Surgical History:  Procedure Laterality Date  . ANKLE SURGERY     removed pins from ankle bone  . BREAST SURGERY     Implants  . DILATION AND CURETTAGE OF UTERUS    . TONSILLECTOMY      There were no vitals filed for this visit.      Subjective Assessment - 04/20/17 1103    Subjective wants to stop taking her anit-depressant (advised to discuss with MD).  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  hips are doing really well; doing yardwork.   Pertinent History memory   Patient Stated Goals improve pain   Currently in Pain? Yes                         [facility] Adult PT Treatment/Exercise - 04/20/17 1108      Lumbar Exercises: Stretches   Single Knee to Chest Stretch 2 reps;30 seconds   Single Knee to Chest Stretch Limitations bil   Lower Trunk Rotation 2 reps;30 seconds   Lower Trunk Rotation Limitations bil   Piriformis Stretch 2 reps;30 seconds   Piriformis  Stretch Limitations bil     Knee/Hip Exercises: Aerobic   Recumbent Bike L2 x 6 min     Knee/Hip Exercises: Standing   Walking with Sports Cord lateral and backwards walking 30'x2; UE support needed for backwards - both with red theraband                     PT Long Term Goals - 04/03/17 1206      PT LONG TERM GOAL #1   Title independent with HEP (05/15/17)   Time 6   Period Weeks   Status New     PT LONG TERM GOAL #2   Title report ability to walk at least 1 mile with pain < 3/10 for improved function and return to regular exercise (05/15/17)   Time 6   Period Weeks   Status New     PT LONG TERM GOAL #3   Title demonstrate at least 4/5 strength in bil hips for improved functional mobility and tolerance to exercise (05/15/17)   Time 6   Period Weeks   Status New     PT LONG TERM GOAL #4   Title report ability to perform yardwork  with pain < 4/10 for improved functional mobility (05/15/17)   Time 6   Period Weeks   Status New               Plan - 04/20/17 1143    Clinical Impression Statement Pt states she's doing really well and has increased activity and yardwork at home.  Has some soreness afterwards, but pain decreased overall.  Discussed progress and current plan, and pt reports she feels ready for d/c next week so will plan to assess goals and address any final concerns.  Will review and update HEP PRN.   PT Treatment/Interventions ADLs/Self Care Home Management;Cryotherapy;Electrical Stimulation;Iontophoresis 75m/ml Dexamethasone;Moist Heat;Ultrasound;Therapeutic exercise;Therapeutic activities;Functional mobility training;Patient/family education;Manual techniques;Taping;Dry needling   PT Next Visit Plan check HEP and update; plan for d/c next week (5/18) unless something changes (pt aware and in agreement)   Consulted and Agree with Plan of Care Patient      Patient will benefit from skilled therapeutic intervention in order to improve the following  deficits and impairments:  Decreased cognition, Pain, Decreased strength, Decreased mobility  Visit Diagnosis: Pain in left hip  Pain in right hip  Muscle weakness (generalized)   Late Entry G-Code Functional Assessment Tool Used  clinical judgement   Functional Limitations Mobility: Walking and moving around   Mobility: Walking and Moving Around Goal Status ([phone] At least 1 percent but less than 20 percent impaired, limited or restricted   Mobility: Walking and Moving Around Discharge Status [phone] At least 1 percent but less than 20 percent impaired, limited or restricted   SLaureen Abrahams PT, DPT 05/03/17 2:08 PM   Problem List Patient Active Problem List   Diagnosis Date Noted  . Diarrhea 04/18/2017  . Greater trochanteric bursitis of both hips 02/27/2017  . Bilateral hip pain 02/11/2017  . Abnormal electrocardiogram 03/11/2016  . Chest pain 03/11/2016  . Hammertoe 01/04/2016  . Osteopenia   . Allergic reaction to bee sting 06/21/2012  . CKD (chronic kidney disease) 03/06/2012  . SI (sacroiliac) pain 03/11/2011  . Insomnia 03/11/2011  . POSTMENOPAUSAL SYNDROME 02/10/2010  . Anxiety state 10/14/2009  . RENAL CALCULUS 08/18/2009  . Dyslipidemia 06/30/2009  . Depression 06/30/2009  . Essential hypertension 06/30/2009  . ALLERGIC RHINITIS 06/30/2009  . COLLAGENOUS COLITIS 04/08/2009       SLaureen Abrahams PT, DPT 04/20/17 11:46 AM    [place] [address]Phone: [phone]  Fax:  [phone] Name: Erika PIZZINIMRN: [id number] of Birth: 811-Oct-1929    PHYSICAL THERAPY DISCHARGE SUMMARY  Visits from Start of Care: 6  Current functional level related to goals / functional outcomes: See above   Remaining deficits: N/a; preparing for d/c but pt canceled last appointments before ability to assess goals   Education / Equipment: HEP  Plan: Patient  agrees to discharge.  Patient goals were not met. Patient is being discharged due to being pleased with the current functional level.  ?????     SLaureen Abrahams PT, DPT 05/03/17 2:06 PM

## 2017-05-07 NOTE — Progress Notes (Deleted)
[name] D.O. [facility] [address] [place], [place] [address] Phone: [phone] Subjective:    CC: Hip pain f/u  VHQ:[id number]:Subjective  [name] is a 83 y.o. female coming in with complaint of hip pain. Patient past medical history significant for chronic kidney disease, osteopenia, as well as sacroiliac pain. Patient states that she is having more bilateral hip pain. Seems to be on the lateral aspect. Patient hasn't started with formal physical therapy. Patient has been going very regularly. Patient states   patient did have x-rays taken 04/13/2016. These were independently visualized by me. Patient was found to have demineralization with moderate degenerative changes of both hips. Patient may also have some mild degenerative changes of the left sacroiliac joint.   Past Medical History:  Diagnosis Date  . Anxiety state, unspecified   . ASTHMA   . Colitis, collagenous 2010  . COLONIC POLYPS, HX OF 06/2002   adenomatous polyp  . DEPRESSION   . HYPERLIPIDEMIA   . HYPERTENSION   . Nephrolithiasis   . Osteopenia   . Substance abuse    Past Surgical History:  Procedure Laterality Date  . ANKLE SURGERY     removed pins from ankle bone  . BREAST SURGERY     Implants  . DILATION AND CURETTAGE OF UTERUS    . TONSILLECTOMY     Social History   Social History  . Marital status: Widowed    Spouse name: N/A  . Number of children: 2  . Years of education: N/A   Social History Main Topics  . Smoking status: Former Games developermoker  . Smokeless tobacco: Never Used     Comment: Stopped 45 years ago  . Alcohol use No  . Drug use: No  . Sexual activity: Not on file   Other Topics Concern  . Not on file   Social History Narrative   Widowed 07/2014 (spouse with dementia for years prior to death)   Lives w/ alcoholic son   Allergies  Allergen Reactions  . Mesalamine     REACTION: fuzzy headed and unsteady   Family History  Problem Relation Age of Onset  . Alzheimer's  disease Mother   . Heart disease Father   . Alzheimer's disease Father   . Colon polyps Brother   . Breast cancer Paternal Aunt   . Alzheimer's disease Other     Past medical history, social, surgical and family history all reviewed in electronic medical record.  No pertanent information unless stated regarding to the chief complaint.   Review of Systems: No headache, visual changes, nausea, vomiting, diarrhea, constipation, , abdominal pain, skin rash, fevers, chills, night sweats, weight loss, swollen lymph nodes,chest pain, shortness of breath, mood changes.  Positive muscle aches and joint aches positive dizziness with her vertigo  Objective  There were no vitals taken for this visit. Systems examined below as of 05/07/17   General: No apparent distress alert and oriented x3 mood and affect normal, dressed appropriately.  HEENT: Pupils equal, extraocular movements intact  Respiratory: Patient's speak in full sentences and does not appear short of breath  Cardiovascular: No lower extremity edema, non tender, no erythema  Skin: Warm dry intact with no signs of infection or rash on extremities or on axial skeleton.  Abdomen: Soft nontender  Neuro: Cranial nerves II through XII are intact, neurovascularly intact in all extremities with 2+ DTRs and 2+ pulses.  Lymph: No lymphadenopathy of posterior or anterior cervical chain or axillae bilaterally.  Gait normal with good balance and coordination.  MSK:  Non tender with full range of motion and good stability and symmetric strength and tone of shoulders, elbows, wrist, hip, knee and ankles bilaterally. Arthritic changes of multiple joints Back Exam:  Inspection: Unremarkable  Motion: Flexion 45 deg, Extension 25 deg, Side Bending to 45 deg bilaterally,  Rotation to 45 deg bilaterally  SLR laying: Negative  XSLR laying: Negative  Palpable tenderness: Tender to palpation in the paraspinal musculature lumbar spine as well as over the left  greater trochanteric area FABER: Continued pain mostly on the lateral aspect of the left hip Very minimal decrease in internal range of motion lacking the last 5 on the right side of the last 10 on the left side.. Does not appear to give her pain Sensory change: Gross sensation intact to all lumbar and sacral dermatomes.  Reflexes: 2+ at both patellar tendons, 2+ at achilles tendons, Babinski's downgoing.  Strength at foot  Plantar-flexion: 5/5 Dorsi-flexion: 5/5 Eversion: 5/5 Inversion: 5/5  Leg strength  Quad: 5/5 Hamstring: 5/5 Hip flexor: 5/5 Hip abductors: 4/5 but symmetric Gait unremarkable.   Wt Readings from Last 3 Encounters:  04/18/17 114 lb (51.7 kg)  03/31/17 116 lb (52.6 kg)  03/27/17 114 lb 6 oz (51.9 kg)     Impression and Recommendations:     This case required medical decision making of moderate complexity.      Note: This dictation was prepared with Dragon dictation along with smaller phrase technology. Any transcriptional errors that result from this process are unintentional.

## 2017-05-09 ENCOUNTER — Ambulatory Visit: Payer: Medicare Other | Admitting: Family Medicine

## 2017-05-29 ENCOUNTER — Telehealth: Payer: Self-pay | Admitting: Family Medicine

## 2017-05-29 NOTE — Telephone Encounter (Signed)
Pt would like her No Show fee on 5/29 for Erika Gibson dropped, there is an appointment note that she called and she had a family emergency, she called through the night call center, but the EOD status was not changed, can the $50 fee be dropped for her? Thanks

## 2017-06-02 NOTE — Telephone Encounter (Signed)
Please notify patient that I have advised billing to remove the No Show fee for 05/09/17 as a courtesy.

## 2017-06-02 NOTE — Telephone Encounter (Signed)
Pt.notified

## 2017-06-15 ENCOUNTER — Ambulatory Visit (INDEPENDENT_AMBULATORY_CARE_PROVIDER_SITE_OTHER): Payer: Medicare Other | Admitting: Internal Medicine

## 2017-06-15 ENCOUNTER — Encounter: Payer: Self-pay | Admitting: Internal Medicine

## 2017-06-15 DIAGNOSIS — F32A Depression, unspecified: Secondary | ICD-10-CM

## 2017-06-15 DIAGNOSIS — I10 Essential (primary) hypertension: Secondary | ICD-10-CM

## 2017-06-15 DIAGNOSIS — F411 Generalized anxiety disorder: Secondary | ICD-10-CM

## 2017-06-15 DIAGNOSIS — F329 Major depressive disorder, single episode, unspecified: Secondary | ICD-10-CM

## 2017-06-15 MED ORDER — ZOLPIDEM TARTRATE 5 MG PO TABS: 5.0000 mg | ORAL_TABLET | Freq: Every evening | ORAL | 5 refills | Status: DC | PRN

## 2017-06-15 MED ORDER — PAROXETINE HCL 10 MG PO TABS: 10.0000 mg | ORAL_TABLET | Freq: Every day | ORAL | 3 refills | Status: DC

## 2017-06-15 NOTE — Patient Instructions (Addendum)
Please take all new medication as prescribed - the generic Paxil 10 mg per day, as well as the ambien 10 mg at bedtime if needed for sleep  Please continue all other medications as before, and refills have been done if requested.  Please have the pharmacy call with any other refills you may need.  You will be contacted regarding the referral for: Counseling  Please keep your appointments with your specialists as you may have planned  Please return in 3 months, or sooner if needed

## 2017-06-15 NOTE — Assessment & Plan Note (Signed)
Also for counseling referral, reassurance, declines atarax prn

## 2017-06-15 NOTE — Assessment & Plan Note (Signed)
Major suspect recurrent, for paxil 10 qd start, refer counseling

## 2017-06-15 NOTE — Progress Notes (Signed)
Subjective:    Patient ID: Erika Gibson, female    DOB: 05/03/1928, 83 y.o.   MRN: [id number]  HPI  Here to f/u, c/o of an increased balance difficulty, so thought might be from celexa so stopped that after last visit may 8  Still having "brain fog" with ramlbing thoughts and speech and thinks this is still from the celexa. .  Also stopped her probiotic since her diarrhea seemed better.  Is doing better orthopedically after seeing Dr Katrinka BlazingSmith.  Still taking asa, vit d, statin and BP meds. Does c/o ongoing fatigue, but denies signficant daytime hypersomnolence. Has had mild worsening depressive symptoms with low mood, no interest in things, not enjoying anything, and no  suicidal ideation, or panic; has ongoing anxiety as well.  Has difficultly sleeping so cut herself back on the otc med, cant remember name.  Tumeric has helped for arthritis/bursitis but stopped that, and today seemed better anyway.   Pt denies fever,  night sweats, loss of appetite, or other constitutional symptoms except lost 4 lbs . Still having bruising to the arms.  Recent labs march and may 2018 neg for acute.  Just not "where to go from here."  Has had more stress recently but now son moved to [place] and no longer as much of a stressor.   No recent substance abuse  No agitation, hallucinations or paranoia  Pt denies chest pain, increased sob or doe, wheezing, orthopnea, PND, increased LE swelling, palpitations, dizziness or syncope, except for some left foot swelling at the end of most days. Past Medical History:  Diagnosis Date  . Anxiety state, unspecified   . ASTHMA   . Colitis, collagenous 2010  . COLONIC POLYPS, HX OF 06/2002   adenomatous polyp  . DEPRESSION   . HYPERLIPIDEMIA   . HYPERTENSION   . Nephrolithiasis   . Osteopenia   . Substance abuse    Past Surgical History:  Procedure Laterality Date  . ANKLE SURGERY     removed pins from ankle bone  . BREAST SURGERY     Implants  . DILATION AND CURETTAGE OF UTERUS      . TONSILLECTOMY      reports that she has quit smoking. She has never used smokeless tobacco. She reports that she does not drink alcohol or use drugs. family history includes Alzheimer's disease in her father, mother, and other; Breast cancer in her paternal aunt; Colon polyps in her brother; Heart disease in her father. Allergies  Allergen Reactions  . Mesalamine     REACTION: fuzzy headed and unsteady   Current Outpatient Prescriptions on File Prior to Visit  Medication Sig Dispense Refill  . aspirin EC 81 MG tablet Take 81 mg by mouth daily.    Marland Kitchen. atorvastatin (LIPITOR) 10 MG tablet Take 10 mg by mouth daily.      . Cholecalciferol (VITAMIN D PO) Take by mouth.    Marland Kitchen. lisinopril (PRINIVIL,ZESTRIL) 10 MG tablet Take 10 mg by mouth daily.      . Multiple Vitamin (MULTIVITAMIN) tablet Take 1 tablet by mouth daily.      Marland Kitchen. triamterene-hydrochlorothiazide (MAXZIDE-25) 37.5-25 MG per tablet Take 1 tablet by mouth daily.       No current facility-administered medications on file prior to visit.    Review of Systems  Constitutional: Negative for other unusual diaphoresis or sweats HENT: Negative for ear discharge or swelling Eyes: Negative for other worsening visual disturbances Respiratory: Negative for stridor or other swelling  Gastrointestinal:  Negative for worsening distension or other blood Genitourinary: Negative for retention or other urinary change Musculoskeletal: Negative for other MSK pain or swelling Skin: Negative for color change or other new lesions Neurological: Negative for worsening tremors and other numbness  Psychiatric/Behavioral: Negative for worsening agitation or other fatigue All other system neg per pt    Objective:   Physical Exam BP 128/76   Pulse 79   Ht 5\' 4"  (1.626 m)   Wt 115 lb (52.2 kg)   SpO2 99%   BMI 19.74 kg/m  VS noted, not ill appaering Constitutional: Pt appears in NAD HENT: Head: NCAT.  Right Ear: External ear normal.  Left Ear:  External ear normal.  Eyes: . Pupils are equal, round, and reactive to light. Conjunctivae and EOM are normal Nose: without d/c or deformity Neck: Neck supple. Gross normal ROM Cardiovascular: Normal rate and regular rhythm.   Pulmonary/Chest: Effort normal and breath sounds without rales or wheezing.  Abd:  Soft, NT, ND, + BS, no organomegaly Neurological: Pt is alert. At baseline orientation, motor grossly intact Skin: Skin is warm. No rashes, other new lesions except for several senile purpura to right arm, no LE edema Psychiatric: Pt behavior is normal without agitation , + markedly depressed/falt affect, mild nervous No other exam findings  Lab Results  Component Value Date   WBC 6.7 04/18/2017   HGB 13.4 04/18/2017   HCT 40.1 04/18/2017   PLT 236.0 04/18/2017   GLUCOSE 88 04/18/2017   CHOL 160 02/10/2017   TRIG 99.0 02/10/2017   HDL 59.20 02/10/2017   LDLCALC 81 02/10/2017   ALT 17 02/10/2017   AST 19 02/10/2017   NA 140 04/18/2017   K 4.7 04/18/2017   CL 106 04/18/2017   CREATININE 1.15 04/18/2017   BUN 23 04/18/2017   CO2 29 04/18/2017   TSH 3.93 02/10/2017   HGBA1C 5.8 02/10/2017       Assessment & Plan:

## 2017-06-15 NOTE — Assessment & Plan Note (Signed)
stable overall by history and exam, recent data reviewed with pt, and pt to continue medical treatment as before,  to f/u any worsening symptoms or concerns BP Readings from Last 3 Encounters:  06/15/17 128/76  04/18/17 124/78  03/31/17 (!) 150/82

## 2017-07-05 ENCOUNTER — Ambulatory Visit (INDEPENDENT_AMBULATORY_CARE_PROVIDER_SITE_OTHER): Payer: Medicare Other | Admitting: Podiatry

## 2017-07-05 ENCOUNTER — Encounter: Payer: Self-pay | Admitting: Podiatry

## 2017-07-05 DIAGNOSIS — B351 Tinea unguium: Secondary | ICD-10-CM | POA: Diagnosis not present

## 2017-07-05 DIAGNOSIS — Q828 Other specified congenital malformations of skin: Secondary | ICD-10-CM

## 2017-07-05 DIAGNOSIS — M79676 Pain in unspecified toe(s): Secondary | ICD-10-CM

## 2017-07-05 NOTE — Progress Notes (Signed)
Patient ID: [name], female   DOB: 14-Aug-1928, 83 y.o.   MRN: [id number] Complaint:  Visit Type: Patient returns to my office for continued preventative foot care services. Complaint: Patient states" my nails have grown long and thick and become painful to walk and wear shoes" . The patient presents for preventative foot care services. No changes to ROS  Podiatric Exam: Vascular: dorsalis pedis and posterior tibial pulses are palpable bilateral. Capillary return is immediate. Temperature gradient is WNL. Skin turgor WNL . Varicosities noted feet B/L Sensorium: Normal Semmes Weinstein monofilament test. Normal tactile sensation bilaterally. Nail Exam: Pt has thick disfigured discolored nails with subungual debris noted bilateral entire nail hallux through fifth toenails Ulcer Exam: There is no evidence of ulcer or pre-ulcerative changes or infection. Orthopedic Exam: Muscle tone and strength are WNL. No limitations in general ROM. No crepitus or effusions noted. Hammer toes 2-5  B/L Skin:  Porokeratosis sub 3 right No infection or ulcers  Diagnosis:  Onychomycosis, , Pain in right toe, pain in left toes  Treatment & Plan Procedures and Treatment: Consent by patient was obtained for treatment procedures. The patient understood the discussion of treatment and procedures well. All questions were answered thoroughly reviewed. Debridement of mycotic and hypertrophic toenails, 1 through 5 bilateral and clearing of subungual debris. No ulceration, no infection noted.   Debride porokeratosis. Return Visit-Office Procedure: Patient instructed to return to the office for a follow up visit 3 months for continued evaluation and treatment.    Erika Gibson DPM

## 2017-07-07 ENCOUNTER — Ambulatory Visit: Payer: Medicare Other | Admitting: Podiatry

## 2017-07-21 ENCOUNTER — Encounter: Payer: Self-pay | Admitting: Internal Medicine

## 2017-07-21 DIAGNOSIS — M8589 Other specified disorders of bone density and structure, multiple sites: Secondary | ICD-10-CM | POA: Diagnosis not present

## 2017-07-27 ENCOUNTER — Telehealth: Payer: Self-pay | Admitting: Internal Medicine

## 2017-07-27 MED ORDER — ALENDRONATE SODIUM 70 MG PO TABS: 70.0000 mg | ORAL_TABLET | ORAL | 3 refills | Status: DC

## 2017-07-27 NOTE — Telephone Encounter (Signed)
[facility]  It is c/w new Osteoporosis  We would like to start tx with fosamax 70 mg weekly to slow down the process, and help reduce risk of bone fracture  Shirron to please inform pt, I will do rx

## 2017-08-02 NOTE — Telephone Encounter (Signed)
Unfortunately no, but the decision to take is really up to her (though I would recommend it)

## 2017-08-02 NOTE — Telephone Encounter (Signed)
Pt came by the office stating that she recently had a bone density scan done through [facility]. She then got a call from [facility] saying that a prescription was ready for pick up. She did not know what it was for. I read her MD response about Fosamax prescription. She said that she wanted to know if there was another way for her to accomplish what Dr Jonny Ruiz is wanting without taking a prescription medication. Please advise. Pt can be reached at [phone].

## 2017-08-02 NOTE — Telephone Encounter (Signed)
At this time the pt would not like to start Fosamax. She would like to up her calcium and vitamin D. I reinforced to her that JJ recommends that she start the fosamax to reduce the risk of bone fracture and slow down the process of osteoporosis. She is aware that she can start the fosamax if she changes her mind.

## 2017-08-22 NOTE — Progress Notes (Signed)
HPI: FU abnormal ECG and CP. Echo 4/17 showed normal LV function, grade 1 diastolic dysfunction, trace AI, mild MR. Since last seen, patient denies dyspnea, chest pain, palpitations or syncope.  Current Outpatient Prescriptions  Medication Sig Dispense Refill  . alendronate (FOSAMAX) 70 MG tablet Take 1 tablet (70 mg total) by mouth every 7 (seven) days. Take with a full glass of water on an empty stomach. 12 tablet 3  . aspirin EC 81 MG tablet Take 81 mg by mouth daily.    Marland Kitchen atorvastatin (LIPITOR) 10 MG tablet Take 10 mg by mouth daily.      . Cholecalciferol (VITAMIN D PO) Take by mouth.    Marland Kitchen lisinopril (PRINIVIL,ZESTRIL) 10 MG tablet Take 10 mg by mouth daily.      . Multiple Vitamin (MULTIVITAMIN) tablet Take 1 tablet by mouth daily.      Marland Kitchen triamterene-hydrochlorothiazide (MAXZIDE-25) 37.5-25 MG per tablet Take 1 tablet by mouth daily.      Marland Kitchen zolpidem (AMBIEN) 5 MG tablet Take 1 tablet (5 mg total) by mouth at bedtime as needed for sleep. 30 tablet 5   No current facility-administered medications for this visit.      Past Medical History:  Diagnosis Date  . Anxiety state, unspecified   . ASTHMA   . Colitis, collagenous 2010  . COLONIC POLYPS, HX OF 06/2002   adenomatous polyp  . DEPRESSION   . HYPERLIPIDEMIA   . HYPERTENSION   . Nephrolithiasis   . Osteopenia   . Substance abuse     Past Surgical History:  Procedure Laterality Date  . ANKLE SURGERY     removed pins from ankle bone  . BREAST SURGERY     Implants  . DILATION AND CURETTAGE OF UTERUS    . TONSILLECTOMY      Social History   Social History  . Marital status: Widowed    Spouse name: N/A  . Number of children: 2  . Years of education: N/A   Occupational History  . Not on file.   Social History Main Topics  . Smoking status: Former Games developer  . Smokeless tobacco: Never Used     Comment: Stopped 45 years ago  . Alcohol use No  . Drug use: No  . Sexual activity: Not on file   Other Topics  Concern  . Not on file   Social History Narrative   Widowed 07/2014 (spouse with dementia for years prior to death)   Lives w/ alcoholic son    Family History  Problem Relation Age of Onset  . Alzheimer's disease Mother   . Heart disease Father   . Alzheimer's disease Father   . Colon polyps Brother   . Breast cancer Paternal Aunt   . Alzheimer's disease Other     ROS: Arthralgias but no fevers or chills, productive cough, hemoptysis, dysphasia, odynophagia, melena, hematochezia, dysuria, hematuria, rash, seizure activity, orthopnea, PND, pedal edema, claudication. Remaining systems are negative.  Physical Exam: Well-developed well-nourished in no acute distress.  Skin is warm and dry.  HEENT is normal.  Neck is supple.  Chest is clear to auscultation with normal expansion.  Cardiovascular exam is regular rate and rhythm.  Abdominal exam nontender or distended. No masses palpated. Extremities show no edema. neuro grossly intact  ECG- sinus rhythm at a rate of 84. Occasional PVC. Cannot rule out septal infarct. Nonspecific ST changes. personally reviewed  A/P  1 Hypertension-blood pressure is controlled. Continue present medications.  2  chest pain-she has had no further chest pain. No further ischemia evaluation.  3 history of abnormal ECG-echocardiogram showed normal LV function and wall motion. No plans for further workup.  4 Hyperlipidemia-continue statin; management per primary care.  Olga MillersBrian Gerron Guidotti, MD

## 2017-08-28 ENCOUNTER — Encounter: Payer: Self-pay | Admitting: Cardiology

## 2017-08-28 ENCOUNTER — Ambulatory Visit (INDEPENDENT_AMBULATORY_CARE_PROVIDER_SITE_OTHER): Payer: Medicare Other | Admitting: Cardiology

## 2017-08-28 VITALS — BP 138/74 | HR 84 | Ht 64.0 in | Wt 117.0 lb

## 2017-08-28 DIAGNOSIS — E7800 Pure hypercholesterolemia, unspecified: Secondary | ICD-10-CM

## 2017-08-28 DIAGNOSIS — I10 Essential (primary) hypertension: Secondary | ICD-10-CM | POA: Diagnosis not present

## 2017-08-28 DIAGNOSIS — R072 Precordial pain: Secondary | ICD-10-CM

## 2017-08-28 NOTE — Patient Instructions (Signed)
Your physician recommends that you schedule a follow-up appointment in: AS NEEDED  

## 2017-08-29 ENCOUNTER — Ambulatory Visit (INDEPENDENT_AMBULATORY_CARE_PROVIDER_SITE_OTHER): Payer: Medicare Other | Admitting: Internal Medicine

## 2017-08-29 ENCOUNTER — Other Ambulatory Visit: Payer: Medicare Other

## 2017-08-29 ENCOUNTER — Telehealth: Payer: Self-pay | Admitting: Internal Medicine

## 2017-08-29 ENCOUNTER — Encounter: Payer: Self-pay | Admitting: Internal Medicine

## 2017-08-29 VITALS — BP 118/78 | HR 82 | Temp 98.6°F | Ht 64.0 in | Wt 117.0 lb

## 2017-08-29 DIAGNOSIS — M8580 Other specified disorders of bone density and structure, unspecified site: Secondary | ICD-10-CM

## 2017-08-29 DIAGNOSIS — I10 Essential (primary) hypertension: Secondary | ICD-10-CM | POA: Diagnosis not present

## 2017-08-29 DIAGNOSIS — R197 Diarrhea, unspecified: Secondary | ICD-10-CM

## 2017-08-29 DIAGNOSIS — F411 Generalized anxiety disorder: Secondary | ICD-10-CM | POA: Diagnosis not present

## 2017-08-29 NOTE — Progress Notes (Signed)
Subjective:    Patient ID: Erika Gibson, female    DOB: 06-14-1928, 83 y.o.   MRN: [id number]  HPI  Here with 1 wk diarrheal stools, was black or dark to start the first 2 days then more brown watery since then, several times per day, no recent change in meds, taking probiotic.  No fever, Denies worsening reflux, abd pain, dysphagia, n/v. Appetite no change, Wt Readings from Last 3 Encounters:  08/29/17 117 lb (53.1 kg)  08/28/17 117 lb (53.1 kg)  06/15/17 115 lb (52.2 kg)  No sick contacts, only lives with dog.  Eats apples but not more lately. BRAT diet no help.  Nothing seems to make worse.  Pt denies chest pain, increased sob or doe, wheezing, orthopnea, PND, increased LE swelling, palpitations, dizziness or syncope.  No hx of c diff.   Did see Dr Crenshaw/card yesterday, doing well per pt, has some doe only with more humid days.  Wants to know if she can continue the tumeric for arthritis.  PT did not help so much.  Thinking about f/u with Dr Katrinka Blazing?sport med for cortisone.  Denies worsening depressive symptoms, suicidal ideation, or panic Past Medical History:  Diagnosis Date  . Anxiety state, unspecified   . ASTHMA   . Colitis, collagenous 2010  . COLONIC POLYPS, HX OF 06/2002   adenomatous polyp  . DEPRESSION   . HYPERLIPIDEMIA   . HYPERTENSION   . Nephrolithiasis   . Osteopenia   . Substance abuse    Past Surgical History:  Procedure Laterality Date  . ANKLE SURGERY     removed pins from ankle bone  . BREAST SURGERY     Implants  . DILATION AND CURETTAGE OF UTERUS    . TONSILLECTOMY      reports that she has quit smoking. She has never used smokeless tobacco. She reports that she does not drink alcohol or use drugs. family history includes Alzheimer's disease in her father, mother, and other; Breast cancer in her paternal aunt; Colon polyps in her brother; Heart disease in her father. Allergies  Allergen Reactions  . Mesalamine     REACTION: fuzzy headed and unsteady    Current Outpatient Prescriptions on File Prior to Visit  Medication Sig Dispense Refill  . alendronate (FOSAMAX) 70 MG tablet Take 1 tablet (70 mg total) by mouth every 7 (seven) days. Take with a full glass of water on an empty stomach. 12 tablet 3  . aspirin EC 81 MG tablet Take 81 mg by mouth daily.    Marland Kitchen atorvastatin (LIPITOR) 10 MG tablet Take 10 mg by mouth daily.      . Cholecalciferol (VITAMIN D PO) Take by mouth.    Marland Kitchen lisinopril (PRINIVIL,ZESTRIL) 10 MG tablet Take 10 mg by mouth daily.      . Multiple Vitamin (MULTIVITAMIN) tablet Take 1 tablet by mouth daily.      Marland Kitchen triamterene-hydrochlorothiazide (MAXZIDE-25) 37.5-25 MG per tablet Take 1 tablet by mouth daily.      Marland Kitchen zolpidem (AMBIEN) 5 MG tablet Take 1 tablet (5 mg total) by mouth at bedtime as needed for sleep. 30 tablet 5   No current facility-administered medications on file prior to visit.    Review of Systems  Constitutional: Negative for other unusual diaphoresis or sweats HENT: Negative for ear discharge or swelling Eyes: Negative for other worsening visual disturbances Respiratory: Negative for stridor or other swelling  Gastrointestinal: Negative for worsening distension or other blood Genitourinary: Negative for retention  or other urinary change Musculoskeletal: Negative for other MSK pain or swelling Skin: Negative for color change or other new lesions Neurological: Negative for worsening tremors and other numbness  Psychiatric/Behavioral: Negative for worsening agitation or other fatigue All other     Objective:   Physical Exam BP 118/78   Pulse 82   Temp 98.6 F (37 C) (Oral)   Ht  (1.626 m)   Wt 117 lb (53.1 kg)   SpO2 97%   BMI 20.08 kg/m  VS noted, not ill appearing Constitutional: Pt appears in NAD HENT: Head: NCAT.  Right Ear: External ear normal.  Left Ear: External ear normal.  Eyes: . Pupils are equal, round, and reactive to light. Conjunctivae and EOM are normal Nose: without d/c  or deformity Neck: Neck supple. Gross normal ROM Cardiovascular: Normal rate and regular rhythm.   Pulmonary/Chest: Effort normal and breath sounds without rales or wheezing.  Abd:  Soft, NT, ND, + BS, no organomegaly - benign exam Neurological: Pt is alert. At baseline orientation, motor grossly intact Skin: Skin is warm. No rashes, other new lesions, no LE edema Psychiatric: Pt behavior is normal without agitation , 1+ nervous No other exam findings    Assessment & Plan:

## 2017-08-29 NOTE — Patient Instructions (Signed)
Please take all new medication as prescribed - the Prolia when this can be arranged  OK to stop the fosamax  Please continue all other medications as before, and refills have been done if requested.  Please have the pharmacy call with any other refills you may need.  Please continue your efforts at being more active, and low cholesterol diet  Please keep your appointments with your specialists as you may have planned  Please consider follow up with Dr Smith/sports medicine if not better in 1 week with the Tumeric

## 2017-08-30 ENCOUNTER — Telehealth: Payer: Self-pay

## 2017-08-30 ENCOUNTER — Other Ambulatory Visit: Payer: Medicare Other

## 2017-08-30 DIAGNOSIS — R197 Diarrhea, unspecified: Secondary | ICD-10-CM

## 2017-08-30 NOTE — Telephone Encounter (Signed)
Per dr Jonny Ruiz, patient needs to start prolia---I am verifying insurance and will call patient back with summary of benefits

## 2017-08-31 ENCOUNTER — Encounter: Payer: Self-pay | Admitting: Internal Medicine

## 2017-08-31 NOTE — Assessment & Plan Note (Signed)
Mild situationally worsening, reassured, declines other med tx change for now

## 2017-08-31 NOTE — Assessment & Plan Note (Signed)
stable overall by history and exam, recent data reviewed with pt, and pt to continue medical treatment as before,  to f/u any worsening symptoms or concerns BP Readings from Last 3 Encounters:  08/29/17 118/78  08/28/17 138/74  06/15/17 128/76

## 2017-08-31 NOTE — Assessment & Plan Note (Signed)
Will try to arrange prolia in lieu of fosamax intolerance

## 2017-08-31 NOTE — Assessment & Plan Note (Signed)
Mild to mod persistent, exam benign, no BRB or fever or pain; will check c dif and stool culture, but suspect benign functional type of diarrhea, for immodium prn,  to f/u any worsening symptoms or concerns

## 2017-09-02 LAB — STOOL CULTURE
MICRO NUMBER:: 81035698
MICRO NUMBER:: 81035699
MICRO NUMBER:: 81035700
SHIGA RESULT: NOT DETECTED
SPECIMEN QUALITY: ADEQUATE
SPECIMEN QUALITY:: ADEQUATE
SPECIMEN QUALITY:: ADEQUATE

## 2017-09-07 ENCOUNTER — Telehealth: Payer: Self-pay | Admitting: Internal Medicine

## 2017-09-07 NOTE — Telephone Encounter (Signed)
Pt came by the office. She said that after doing her own research, she does not want to have the prolia injection.

## 2017-09-07 NOTE — Telephone Encounter (Signed)
Patient has been archived in prolia portal

## 2017-09-13 NOTE — Telephone Encounter (Signed)
error 

## 2017-09-15 ENCOUNTER — Ambulatory Visit (INDEPENDENT_AMBULATORY_CARE_PROVIDER_SITE_OTHER): Payer: Medicare Other | Admitting: Internal Medicine

## 2017-09-15 ENCOUNTER — Encounter: Payer: Self-pay | Admitting: Internal Medicine

## 2017-09-15 VITALS — BP 132/78 | HR 83 | Temp 98.0°F | Ht 64.0 in | Wt 120.0 lb

## 2017-09-15 DIAGNOSIS — F411 Generalized anxiety disorder: Secondary | ICD-10-CM

## 2017-09-15 DIAGNOSIS — N189 Chronic kidney disease, unspecified: Secondary | ICD-10-CM | POA: Diagnosis not present

## 2017-09-15 DIAGNOSIS — E785 Hyperlipidemia, unspecified: Secondary | ICD-10-CM | POA: Diagnosis not present

## 2017-09-15 DIAGNOSIS — Z23 Encounter for immunization: Secondary | ICD-10-CM

## 2017-09-15 DIAGNOSIS — I10 Essential (primary) hypertension: Secondary | ICD-10-CM

## 2017-09-15 MED ORDER — ALENDRONATE SODIUM 70 MG PO TABS: 70.0000 mg | ORAL_TABLET | ORAL | 3 refills | Status: DC

## 2017-09-15 NOTE — Assessment & Plan Note (Signed)
stable overall by history and exam, recent data reviewed with pt, and pt to continue medical treatment as before,  to f/u any worsening symptoms or concerns BP Readings from Last 3 Encounters:  09/15/17 132/78  08/29/17 118/78  08/28/17 138/74

## 2017-09-15 NOTE — Progress Notes (Signed)
Subjective:    Patient ID: Erika Gibson, female    DOB: 1928-06-24, 83 y.o.   MRN: [id number]  HPI  Here to fu; Pt denies chest pain, increased sob or doe, wheezing, orthopnea, PND, increased LE swelling, palpitations, dizziness or syncope.  Pt denies new neurological symptoms such as new headache, or facial or extremity weakness or numbness   Pt denies polydipsia, polyuria  Dairrhea resolved.  Declines prolia for now, wants to cont weekly bisphosphonate.  Does think she had a reaction and side effect of weakness and off balance the next am after doxylamine succinate otc, only took half of 25 mg.  Does not want to try ambien if had this kind of trouble with otc.  Has not tried melatonin but willing to try.  Has been taking tumeric for morning bilat hip pain and seems to be helping.  Denies worsening depressive symptoms, suicidal ideation, or panic; has ongoing anxiety, some increased recently.  Has been on multiple SSRI in past , and trials of buspar and xanax as well.  States she had one in mind, now forgets but will call back with name. Past Medical History:  Diagnosis Date  . Anxiety state, unspecified   . ASTHMA   . Colitis, collagenous 2010  . COLONIC POLYPS, HX OF 06/2002   adenomatous polyp  . DEPRESSION   . HYPERLIPIDEMIA   . HYPERTENSION   . Nephrolithiasis   . Osteopenia   . Substance abuse [facility])    Past Surgical History:  Procedure Laterality Date  . ANKLE SURGERY     removed pins from ankle bone  . BREAST SURGERY     Implants  . DILATION AND CURETTAGE OF UTERUS    . TONSILLECTOMY      reports that she has quit smoking. She has never used smokeless tobacco. She reports that she does not drink alcohol or use drugs. family history includes Alzheimer's disease in her father, mother, and other; Breast cancer in her paternal aunt; Colon polyps in her brother; Heart disease in her father. Allergies  Allergen Reactions  . Mesalamine     REACTION: fuzzy headed and unsteady    Current Outpatient Prescriptions on File Prior to Visit  Medication Sig Dispense Refill  . aspirin EC 81 MG tablet Take 81 mg by mouth daily.    Marland Kitchen atorvastatin (LIPITOR) 10 MG tablet Take 10 mg by mouth daily.      . Cholecalciferol (VITAMIN D PO) Take by mouth.    Marland Kitchen lisinopril (PRINIVIL,ZESTRIL) 10 MG tablet Take 10 mg by mouth daily.      . Multiple Vitamin (MULTIVITAMIN) tablet Take 1 tablet by mouth daily.      Marland Kitchen triamterene-hydrochlorothiazide (MAXZIDE-25) 37.5-25 MG per tablet Take 1 tablet by mouth daily.       No current facility-administered medications on file prior to visit.    Review of Systems  Constitutional: Negative for other unusual diaphoresis or sweats HENT: Negative for ear discharge or swelling Eyes: Negative for other worsening visual disturbances Respiratory: Negative for stridor or other swelling  Gastrointestinal: Negative for worsening distension or other blood Genitourinary: Negative for retention or other urinary change Musculoskeletal: Negative for other MSK pain or swelling Skin: Negative for color change or other new lesions Neurological: Negative for worsening tremors and other numbness  Psychiatric/Behavioral: Negative for worsening agitation or other fatigue All other system neg per pt    Objective:   Physical Exam BP 132/78   Pulse 83   Temp 98  F (36.7 C) (Oral)   Ht  (1.626 m)   Wt 120 lb (54.4 kg)   SpO2 98%   BMI 20.60 kg/m  VS noted,  Constitutional: Pt appears in NAD HENT: Head: NCAT.  Right Ear: External ear normal.  Left Ear: External ear normal.  Eyes: . Pupils are equal, round, and reactive to light. Conjunctivae and EOM are normal Nose: without d/c or deformity Neck: Neck supple. Gross normal ROM Cardiovascular: Normal rate and regular rhythm.   Pulmonary/Chest: Effort normal and breath sounds without rales or wheezing.  Neurological: Pt is alert. At baseline orientation, motor grossly intact Skin: Skin is warm. No  rashes, other new lesions, no LE edema Psychiatric: Pt behavior is normal without agitation , 1-2+ nervous No other exam findings Lab Results  Component Value Date   WBC 6.7 04/18/2017   HGB 13.4 04/18/2017   HCT 40.1 04/18/2017   PLT 236.0 04/18/2017   GLUCOSE 88 04/18/2017   CHOL 160 02/10/2017   TRIG 99.0 02/10/2017   HDL 59.20 02/10/2017   LDLCALC 81 02/10/2017   ALT 17 02/10/2017   AST 19 02/10/2017   NA 140 04/18/2017   K 4.7 04/18/2017   CL 106 04/18/2017   CREATININE 1.15 04/18/2017   BUN 23 04/18/2017   CO2 29 04/18/2017   TSH 3.93 02/10/2017   HGBA1C 5.8 02/10/2017       Assessment & Plan:

## 2017-09-15 NOTE — Patient Instructions (Signed)
Please continue all other medications as before, and refills have been done if requested.  Please have the pharmacy call with any other refills you may need.  Please continue your efforts at being more active, low cholesterol diet, and weight control.  You are otherwise up to date with prevention measures today.  Please keep your appointments with your specialists as you may have planned  Please return in 6 months, or sooner if needed 

## 2017-09-15 NOTE — Assessment & Plan Note (Signed)
stable overall by history and exam, recent data reviewed with pt, and pt to continue medical treatment as before,  to f/u any worsening symptoms or concerns Lab Results  Component Value Date   LDLCALC 81 02/10/2017

## 2017-09-15 NOTE — Assessment & Plan Note (Signed)
Chronic persistent, wary of any new meds, will call back with name of what meant to ask for, o/w stable overall by history and exam, and pt to continue medical treatment as before,  to f/u any worsening symptoms or concerns

## 2017-09-15 NOTE — Assessment & Plan Note (Signed)
Lab Results  Component Value Date   CREATININE 1.15 04/18/2017  stable overall by history and exam, recent data reviewed with pt, and pt to continue medical treatment as before,  to f/u any worsening symptoms or concerns

## 2017-10-03 ENCOUNTER — Ambulatory Visit (INDEPENDENT_AMBULATORY_CARE_PROVIDER_SITE_OTHER): Payer: Medicare Other | Admitting: Podiatry

## 2017-10-03 DIAGNOSIS — B351 Tinea unguium: Secondary | ICD-10-CM

## 2017-10-03 DIAGNOSIS — M79676 Pain in unspecified toe(s): Secondary | ICD-10-CM

## 2017-10-03 NOTE — Progress Notes (Signed)
Patient ID: Erika GashBetty J Orser, female   DOB: 1928-01-21, 83 y.o.   MRN: [id number] Complaint:  Visit Type: Patient returns to my office for continued preventative foot care services. Complaint: Patient states" my nails have grown long and thick and become painful to walk and wear shoes" . The patient presents for preventative foot care services. No changes to ROS  Podiatric Exam: Vascular: dorsalis pedis and posterior tibial pulses are palpable bilateral. Capillary return is immediate. Temperature gradient is WNL. Skin turgor WNL . Varicosities noted feet B/L Sensorium: Normal Semmes Weinstein monofilament test. Normal tactile sensation bilaterally. Nail Exam: Pt has thick disfigured discolored nails with subungual debris noted bilateral entire nail hallux through fifth toenails Ulcer Exam: There is no evidence of ulcer or pre-ulcerative changes or infection. Orthopedic Exam: Muscle tone and strength are WNL. No limitations in general ROM. No crepitus or effusions noted. Hammer toes 2-5  B/L Skin:  Porokeratosis sub 3 right, asymptomatic No infection or ulcers  Diagnosis:  Onychomycosis, , Pain in right toe, pain in left toes  Treatment & Plan Procedures and Treatment: Consent by patient was obtained for treatment procedures. The patient understood the discussion of treatment and procedures well. All questions were answered thoroughly reviewed. Debridement of mycotic and hypertrophic toenails, 1 through 5 bilateral and clearing of subungual debris. No ulceration, no infection noted. .  ABN signed for 2018   Return Visit-Office Procedure: Patient instructed to return to the office for a follow up visit 3 months for continued evaluation and treatment.    Helane GuntherGregory Sheniqua Carolan DPM

## 2017-10-05 ENCOUNTER — Ambulatory Visit (INDEPENDENT_AMBULATORY_CARE_PROVIDER_SITE_OTHER)
Admission: RE | Admit: 2017-10-05 | Discharge: 2017-10-05 | Disposition: A | Discharge status: Home / Self Care | Payer: Medicare Other | Source: Ambulatory Visit | Attending: Nurse Practitioner | Admitting: Nurse Practitioner

## 2017-10-05 ENCOUNTER — Encounter: Payer: Self-pay | Admitting: Nurse Practitioner

## 2017-10-05 ENCOUNTER — Telehealth: Payer: Self-pay | Admitting: Internal Medicine

## 2017-10-05 ENCOUNTER — Ambulatory Visit (INDEPENDENT_AMBULATORY_CARE_PROVIDER_SITE_OTHER): Payer: Medicare Other | Admitting: Nurse Practitioner

## 2017-10-05 VITALS — BP 146/80 | HR 75 | Temp 98.1°F | Ht 64.0 in | Wt 123.0 lb

## 2017-10-05 DIAGNOSIS — S060X1A Concussion with loss of consciousness of 30 minutes or less, initial encounter: Secondary | ICD-10-CM

## 2017-10-05 DIAGNOSIS — W19XXXA Unspecified fall, initial encounter: Secondary | ICD-10-CM

## 2017-10-05 DIAGNOSIS — S0993XA Unspecified injury of face, initial encounter: Secondary | ICD-10-CM | POA: Diagnosis not present

## 2017-10-05 DIAGNOSIS — G44311 Acute post-traumatic headache, intractable: Secondary | ICD-10-CM | POA: Diagnosis not present

## 2017-10-05 DIAGNOSIS — S0990XA Unspecified injury of head, initial encounter: Secondary | ICD-10-CM

## 2017-10-05 NOTE — Patient Instructions (Addendum)
Encourage adequate rest and oral hydration.  Normal head and facial CT.  Follow concussion instructions.  If symptoms do not improve in another 2weeks, please return to office.   Head Injury, Adult There are many types of head injuries. They can be as minor as a Erika Gibson. Some head injuries can be worse. Worse injuries include:  A strong hit to the head that hurts the brain (concussion).  A bruise of the brain (contusion). This means there is bleeding in the brain that can cause swelling.  A cracked skull (skull fracture).  Bleeding in the brain that gathers, gets thick (makes a clot), and forms a Erika Gibson (hematoma).  Most problems from a head injury come in the first 24 hours. However, you may still have side effects up to 7-10 days after your injury. It is important to watch your condition for any changes. Follow these instructions at home: Activity  Rest as much as possible.  Avoid activities that are hard or tiring.  Make sure you get enough sleep.  Limit activities that need a lot of thought or attention, such as: ? Watching TV. ? Playing memory games and puzzles. ? Job-related work or homework. ? Working on [employer], and texting.  Avoid activities that could cause another head injury until your doctor says it is okay. This includes playing sports.  Ask your doctor when it is safe for you to go back to your normal activities, such as work or school. Ask your doctor for a step-by-step plan for slowly going back to your normal activities.  Ask your doctor when you can drive, ride a bicycle, or use heavy machinery. Never do these activities if you are dizzy.  Lifestyle  Do not drink alcohol until your doctor says it is okay.  Avoid drug use.  If it is harder than usual to remember things, write them down.  If you are easily distracted, try to do one thing at a time.  Talk with family members or close friends when making important decisions.  Tell your  friends, family, a trusted coworker, and work Production designer, theatre/television/film about your injury, symptoms, and limits (restrictions). Have them watch for any problems that are new or getting worse.  General instructions  Take over-the-counter and prescription medicines only as told by your doctor.  Have someone stay with you for 24 hours after your head injury. This person should watch you for any changes in your symptoms and be ready to get help.  Keep all follow-up visits as told by your doctor. This is important.  Prevention  Work on Therapist, music. This can help you avoid falls.  Wear a seatbelt when you are in a moving vehicle.  Wear a helmet when: ? Riding a bicycle. ? Skiing. ? Doing any other sport or activity that has a risk of injury.  Drink alcohol only in moderation.  Make your home safer by: ? Getting rid of clutter from the floors and stairs, like things that can make you trip. ? Using grab bars in bathrooms and handrails by stairs. ? Placing non-slip mats on floors and in bathtubs. ? Putting more light in dim areas. Get help right away if:  You have: ? A very bad (severe) headache that is not helped by medicine. ? Trouble walking or weakness in your arms and legs. ? Clear or bloody fluid coming from your nose or ears. ? Changes in your seeing (vision). ? Jerky movements that you cannot control (seizure).  You  throw up (vomit).  Your symptoms get worse.  You lose balance.  Your speech is slurred.  You pass out.  You are sleepier and have trouble staying awake.  The black centers of your eyes (pupils) change in size. These symptoms may be an emergency. Do not wait to see if the symptoms will go away. Get medical help right away. Call your local emergency services (911 in the [place] Do not drive yourself to the hospital. This information is not intended to replace advice given to you by your health care provider. Make sure you discuss any questions you have with your  health care provider. Document Released: 11/10/2008 Document Revised: 06/23/2016 Document Reviewed: 06/07/2016 Elsevier Interactive Patient Education  2017 [employer]

## 2017-10-05 NOTE — Telephone Encounter (Signed)
Patient called in to get CT results from [place].  Gave [place] response to patient.

## 2017-10-05 NOTE — Progress Notes (Signed)
Subjective:  Patient ID: Erika Gibson, female    DOB: March 08, 1928  Age: 83 y.o. MRN: [id number]  CC: Fall (fall 10/16, hit on right side of face,bruises,swelling,disoriented when got up. experiencing ringing and buzzing in head, little disoriented.  )   Fall  The accident occurred more than 1 week ago. The fall occurred while walking (pulled by her 80lb dog). She fell from an unknown height. She landed on concrete. The point of impact was the head and face. The pain is present in the head and face. The pain is moderate. The symptoms are aggravated by movement. Associated symptoms include headaches, a loss of consciousness and numbness. Pertinent negatives include no abdominal pain, bowel incontinence, fever, hearing loss, hematuria, nausea, tingling, visual change or vomiting. Associated symptoms comments: Ringing in right ear. Unsteady gait. Difficulty with decision making. She has tried ice and rest for the symptoms. The treatment provided mild relief.   Larey Seat 09/26/17 while walking her dog, unsure if she lost consciousness or not. Disoriented after fall, cleared within a few hours. impact of right side of face. Residual headache and ringing in ears, unsteady gait and fogginess.  Outpatient Medications Prior to Visit  Medication Sig Dispense Refill  . alendronate (FOSAMAX) 70 MG tablet Take 1 tablet (70 mg total) by mouth every 7 (seven) days. Take with a full glass of water on an empty stomach. 12 tablet 3  . aspirin EC 81 MG tablet Take 81 mg by mouth daily.    Marland Kitchen atorvastatin (LIPITOR) 10 MG tablet Take 10 mg by mouth daily.      . Cholecalciferol (VITAMIN D PO) Take by mouth.    Marland Kitchen lisinopril (PRINIVIL,ZESTRIL) 10 MG tablet Take 10 mg by mouth daily.      . Multiple Vitamin (MULTIVITAMIN) tablet Take 1 tablet by mouth daily.      Marland Kitchen triamterene-hydrochlorothiazide (MAXZIDE-25) 37.5-25 MG per tablet Take 1 tablet by mouth daily.       No facility-administered medications prior to visit.      ROS See HPI  Objective:  BP (!) 146/80   Pulse 75   Temp 98.1 F (36.7 C)   Ht 5\' 4"  (1.626 m)   Wt 123 lb (55.8 kg)   SpO2 96%   BMI 21.11 kg/m   BP Readings from Last 3 Encounters:  10/05/17 (!) 146/80  09/15/17 132/78  08/29/17 118/78    Wt Readings from Last 3 Encounters:  10/05/17 123 lb (55.8 kg)  09/15/17 120 lb (54.4 kg)  08/29/17 117 lb (53.1 kg)    Physical Exam  Constitutional: She is oriented to person, place, and time.  HENT:  Head: Head is with abrasion and with right periorbital erythema. Head is without laceration.  Right Ear: Tympanic membrane, external ear and ear canal normal.  Left Ear: Tympanic membrane, external ear and ear canal normal.  Nose: Nose normal.  Mouth/Throat: Uvula is midline and oropharynx is clear and moist.  Eyes: Pupils are equal, round, and reactive to light. Conjunctivae and EOM are normal.  Neck: Normal range of motion. Neck supple.  Cardiovascular: Normal rate.   Pulmonary/Chest: Effort normal.  Neurological: She is alert and oriented to person, place, and time. Coordination normal.  Skin: Skin is warm and dry.  Psychiatric: She has a normal mood and affect. Her behavior is normal.  Vitals reviewed.   Lab Results  Component Value Date   WBC 6.7 04/18/2017   HGB 13.4 04/18/2017   HCT 40.1 04/18/2017  PLT 236.0 04/18/2017   GLUCOSE 88 04/18/2017   CHOL 160 02/10/2017   TRIG 99.0 02/10/2017   HDL 59.20 02/10/2017   LDLCALC 81 02/10/2017   ALT 17 02/10/2017   AST 19 02/10/2017   NA 140 04/18/2017   K 4.7 04/18/2017   CL 106 04/18/2017   CREATININE 1.15 04/18/2017   BUN 23 04/18/2017   CO2 29 04/18/2017   TSH 3.93 02/10/2017   HGBA1C 5.8 02/10/2017    Dg Hips Bilat With Pelvis 3-4 Views  Result Date: 04/13/2016 CLINICAL DATA:  Chronic pain in both hips for 1 year, history hypertension, asthma, nephrolithiasis, collagenous colitis EXAM: DG HIP (WITH OR WITHOUT PELVIS) 3-4V BILAT COMPARISON:  None FINDINGS:  Osseous demineralization. BILATERAL hip joint space narrowing. SI joints fairly well preserved. No acute fracture, dislocation or bone destruction. IMPRESSION: Osseous demineralization with degenerative changes of both hip joints. Electronically Signed   By: Ulyses SouthwardMark  Boles M.D.   On: 04/13/2016 16:40    Assessment & Plan:   Erika RhodesBetty was seen today for fall.  Diagnoses and all orders for this visit:  Fall, initial encounter -     CT Head Wo Contrast; Future -     CT Maxillofacial WO CM; Future  Traumatic injury of head, initial encounter -     CT Head Wo Contrast; Future -     CT Maxillofacial WO CM; Future  Concussion with loss of consciousness of 30 minutes or less, initial encounter -     CT Head Wo Contrast; Future -     CT Maxillofacial WO CM; Future  Intractable acute post-traumatic headache -     CT Head Wo Contrast; Future -     CT Maxillofacial WO CM; Future  Other orders -     Cancel: CT MAXILLOFACIAL WO CONTRAST; Future   I am having Erika Gibson maintain her atorvastatin, lisinopril, multivitamin, triamterene-hydrochlorothiazide, Cholecalciferol (VITAMIN D PO), aspirin EC, and alendronate.  No orders of the defined types were placed in this encounter.   Follow-up: Return if symptoms worsen or fail to improve.  Erika [place] Yanis Larin, NP

## 2017-10-19 ENCOUNTER — Ambulatory Visit (INDEPENDENT_AMBULATORY_CARE_PROVIDER_SITE_OTHER): Payer: Medicare Other | Admitting: Internal Medicine

## 2017-10-19 ENCOUNTER — Encounter: Payer: Self-pay | Admitting: Internal Medicine

## 2017-10-19 DIAGNOSIS — S0990XD Unspecified injury of head, subsequent encounter: Secondary | ICD-10-CM

## 2017-10-19 DIAGNOSIS — S0990XA Unspecified injury of head, initial encounter: Secondary | ICD-10-CM | POA: Insufficient documentation

## 2017-10-19 DIAGNOSIS — I10 Essential (primary) hypertension: Secondary | ICD-10-CM | POA: Diagnosis not present

## 2017-10-19 DIAGNOSIS — H9319 Tinnitus, unspecified ear: Secondary | ICD-10-CM

## 2017-10-19 NOTE — Assessment & Plan Note (Signed)
Stable, exam benign, advised to let someone else manage the dog

## 2017-10-19 NOTE — Patient Instructions (Signed)
Please continue all other medications as before, and refills have been done if requested.  Please have the pharmacy call with any other refills you may need.  Please continue your efforts at being more active, low cholesterol diet, and weight control.  Please keep your appointments with your specialists as you may have planned     

## 2017-10-19 NOTE — Assessment & Plan Note (Signed)
D/w pt, hopefully to resolve post fall and head injury soon, no specific tx for now, pt declines MRI to r/o inner ear issue

## 2017-10-19 NOTE — Assessment & Plan Note (Signed)
Uncontrolled today, pt states much better at home, nervous today, o/w stable overall by history and exam, recent data reviewed with pt, and pt to continue medical treatment as before - declines any change,  to f/u any worsening symptoms or concerns. BP Readings from Last 3 Encounters:  10/19/17 (!) 162/90  10/05/17 (!) 146/80  09/15/17 132/78

## 2017-10-19 NOTE — Progress Notes (Signed)
Subjective:     Patient ID: Erika Gibson, female   DOB: [birth year], 83 y.o.   MRN: [id number]  HPI   Here to f/u, c/o Fall with visit here oct 25 after she was walking her 85 lb lunging dog; she is now considering hiring someone to walk or train the dog instead of her.  Did have concussion symptoms and right facial swelling and bruising, now much improved.  CT head and maxillofacial without fx or other significant abnormality.  Unfortunately, ringing and buzzing tinnitus persists.  Declines MRI for now after d/w regarding such.  Pt denies new neurological symptoms such as new headache, or facial or extremity weakness or numbness   Pt denies polydipsia, polyuria.   Pt denies fever, wt loss, night sweats, loss of appetite, or other constitutional symptoms Past Medical History:  Diagnosis Date  . Anxiety state, unspecified   . ASTHMA   . Colitis, collagenous 2010  . COLONIC POLYPS, HX OF 06/2002   adenomatous polyp  . DEPRESSION   . HYPERLIPIDEMIA   . HYPERTENSION   . Nephrolithiasis   . Osteopenia   . Substance abuse [facility](HCC)    Past Surgical History:  Procedure Laterality Date  . ANKLE SURGERY     removed pins from ankle bone  . BREAST SURGERY     Implants  . DILATION AND CURETTAGE OF UTERUS    . TONSILLECTOMY      reports that she has quit smoking. she has never used smokeless tobacco. She reports that she does not drink alcohol or use drugs. family history includes Alzheimer's disease in her father, mother, and other; Breast cancer in her paternal aunt; Colon polyps in her brother; Heart disease in her father. Allergies  Allergen Reactions  . Mesalamine     REACTION: fuzzy headed and unsteady   Current Outpatient Medications on File Prior to Visit  Medication Sig Dispense Refill  . alendronate (FOSAMAX) 70 MG tablet Take 1 tablet (70 mg total) by mouth every 7 (seven) days. Take with a full glass of water on an empty stomach. 12 tablet 3  . aspirin EC 81 MG tablet Take 81 mg by mouth  daily.    Marland Kitchen. atorvastatin (LIPITOR) 10 MG tablet Take 10 mg by mouth daily.      . Cholecalciferol (VITAMIN D PO) Take by mouth.    Marland Kitchen. lisinopril (PRINIVIL,ZESTRIL) 10 MG tablet Take 10 mg by mouth daily.      . Multiple Vitamin (MULTIVITAMIN) tablet Take 1 tablet by mouth daily.      Marland Kitchen. triamterene-hydrochlorothiazide (MAXZIDE-25) 37.5-25 MG per tablet Take 1 tablet by mouth daily.       No current facility-administered medications on file prior to visit.    Review of Systems  Constitutional: Negative for other unusual diaphoresis or sweats HENT: Negative for ear discharge or swelling Eyes: Negative for other worsening visual disturbances Respiratory: Negative for stridor or other swelling  Gastrointestinal: Negative for worsening distension or other blood Genitourinary: Negative for retention or other urinary change Musculoskeletal: Negative for other MSK pain or swelling Skin: Negative for color change or other new lesions Neurological: Negative for worsening tremors and other numbness  Psychiatric/Behavioral: Negative for worsening agitation or other fatigue All other system neg per pt    Objective:   Physical Exam BP (!) 162/90   Pulse 75   Temp 97.9 F (36.6 C) (Oral)   Ht 5\' 4"  (1.626 m)   Wt 123 lb (55.8 kg)   SpO2 98%  BMI 21.11 kg/m  VS noted,  Constitutional: Pt appears in NAD HENT: Head: NCAT.  Right Ear: External ear normal.  Left Ear: External ear normal.  Eyes: . Pupils are equal, round, and reactive to light. Conjunctivae and EOM are normal Nose: without d/c or deformity Neck: Neck supple. Gross normal ROM Cardiovascular: Normal rate and regular rhythm.   Pulmonary/Chest: Effort normal and breath sounds without rales or wheezing.  Neurological: Pt is alert. At baseline orientation, motor grossly intact, cn 2-12 intact, gait intact Skin: Skin is warm. No rashes, other new lesions, no LE edema Psychiatric: Pt behavior is normal without agitation  No other  exam findings        Assessment:       Plan:

## 2018-01-03 ENCOUNTER — Encounter: Payer: Self-pay | Admitting: Podiatry

## 2018-01-03 ENCOUNTER — Ambulatory Visit (INDEPENDENT_AMBULATORY_CARE_PROVIDER_SITE_OTHER): Payer: Medicare Other | Admitting: Podiatry

## 2018-01-03 DIAGNOSIS — M79676 Pain in unspecified toe(s): Secondary | ICD-10-CM | POA: Diagnosis not present

## 2018-01-03 DIAGNOSIS — B351 Tinea unguium: Secondary | ICD-10-CM | POA: Diagnosis not present

## 2018-01-03 DIAGNOSIS — Q828 Other specified congenital malformations of skin: Secondary | ICD-10-CM

## 2018-01-03 DIAGNOSIS — M79674 Pain in right toe(s): Secondary | ICD-10-CM

## 2018-01-03 DIAGNOSIS — M79675 Pain in left toe(s): Secondary | ICD-10-CM

## 2018-01-03 NOTE — Progress Notes (Addendum)
Patient ID: [name], female   DOB: 16-May-1928, 84 y.o.   MRN: [id number] Patient ID: [name], female   DOB: 16-May-1928, 84 y.o.   MRN: [id number] Complaint:  Visit Type: Patient returns to my office for continued preventative foot care services. Complaint: Patient states" my nails have grown long and thick and become painful to walk and wear shoes" . The patient presents for preventative foot care services. No changes to ROS  Podiatric Exam: Vascular: dorsalis pedis and posterior tibial pulses are palpable bilateral. Capillary return is immediate. Temperature gradient is WNL. Skin turgor WNL . Varicosities noted feet B/L Sensorium: Normal Semmes Weinstein monofilament test. Normal tactile sensation bilaterally. Nail Exam: Pt has thick disfigured discolored nails with subungual debris noted bilateral entire nail hallux through fifth toenails Ulcer Exam: There is no evidence of ulcer or pre-ulcerative changes or infection. Orthopedic Exam: Muscle tone and strength are WNL. No limitations in general ROM. No crepitus or effusions noted. Hammer toes 2-5  B/L Skin:  Porokeratosis sub 3 right, asymptomatic No infection or ulcers  Diagnosis:  Onychomycosis, , Pain in right toe, pain in left toes  Treatment & Plan Procedures and Treatment: Consent by patient was obtained for treatment procedures. The patient understood the discussion of treatment and procedures well. All questions were answered thoroughly reviewed. Debridement of mycotic and hypertrophic toenails, 1 through 5 bilateral and clearing of subungual debris. No ulceration, no infection noted. .  ABN signed for 2019   Return Visit-Office Procedure: Patient instructed to return to the office for a follow up visit 3 months for continued evaluation and treatment.    Helane GuntherGregory Jhane Lorio DPM

## 2018-01-18 ENCOUNTER — Ambulatory Visit (INDEPENDENT_AMBULATORY_CARE_PROVIDER_SITE_OTHER): Payer: Medicare Other | Admitting: Adult Health

## 2018-01-18 ENCOUNTER — Encounter: Payer: Self-pay | Admitting: Adult Health

## 2018-01-18 VITALS — BP 132/74 | HR 76 | Ht 64.0 in | Wt 127.0 lb

## 2018-01-18 DIAGNOSIS — R609 Edema, unspecified: Secondary | ICD-10-CM

## 2018-01-18 DIAGNOSIS — R0989 Other specified symptoms and signs involving the circulatory and respiratory systems: Secondary | ICD-10-CM

## 2018-01-18 NOTE — Patient Instructions (Addendum)
Medication Instructions:  NO CHANGES-Your physician recommends that you continue on your current medications as directed. Please refer to the Current Medication list given to you today.  If you need a refill on your cardiac medications before your next appointment, please call your pharmacy.  Testing/Procedures: Your physician has requested that you have a carotid duplex. This test is an ultrasound of the carotid arteries in your neck. It looks at blood flow through these arteries that supply the brain with blood. Allow one hour for this exam. There are no restrictions or special instructions.  Special Instructions: PLEASE SEE INSTRUCTIONS BELOW FOR COMPRESSION STOCKINGS.  PLEASE CALL [phone] FOR REFERRAL TO ARTHRITIS SPECIALIST.  Follow-Up: Your physician wants you to follow-up in: 1 MONTH WITH DR Jens SomRENSHAW -OR- Joni ReiningKATHRYN LAWRENCE, DNP.    Thank you for choosing [facility] at [facility]!!    Joni ReiningKathryn Lawrence, DNP recommends KNEE HIGH COMPRESSION STOCKINGS  -- 20-30 mmHg (compression strength) -- [facility]  -- [address] [place]  -- [phone]  -- [employer]   -- [address] [place]   -- [phone] -- [facility]  -- [address] [place]  -- [phone]  How to Use Compression Stockings Compression stockings are elastic socks that squeeze the legs. They help to increase blood flow to the legs, decrease swelling in the legs, and reduce the chance of developing blood clots in the lower legs. Compression stockings are often used by people who:  Are recovering from surgery.  Have poor circulation in their legs.  Are prone to getting blood clots in their legs.  Have varicose veins.  Sit or stay in bed for long periods of time. How to use compression stockings Before you put on your compression stockings:  Make sure that they are the correct size. If you do not know your size, ask  your health care provider.  Make sure that they are clean, dry, and in good condition.  Check them for rips and tears. Do not put them on if they are ripped or torn. Put your stockings on first thing in the morning, before you get out of bed. Keep them on for as long as your health care provider advises. When you are wearing your stockings:  Keep them as smooth as possible. Do not allow them to bunch up. It is especially important to prevent the stockings from bunching up around your toes or behind your knees.  Do not roll the stockings downward and leave them rolled down. This can decrease blood flow to your leg.  Change them right away if they become wet or dirty. When you take off your stockings, inspect your legs and feet. Anything that does not seem normal may require medical attention. Look for:  Open sores.  Red spots.  Swelling. Information and tips  Do not stop wearing your compression stockings without talking to your health care provider first.  Wash your stockings every day with mild detergent in cold or warm water. Do not use bleach. Air-dry your stockings or dry them in a clothes dryer on low heat.  Replace your stockings every 3-6 months.  If skin moisturizing is part of your treatment plan, apply lotion or cream at night so that your skin will be dry when you put on the stockings in the morning. It is harder to put the stockings on when you have lotion on your legs or feet. Contact a health care provider if: Remove your stockings and seek medical  care if:  You have a feeling of pins and needles in your feet or legs.  You have any new changes in your skin.  You have skin lesions that are getting worse.  You have swelling or pain that is getting worse. Get help right away if:  You have numbness or tingling in your lower legs that does not get better right after you take the stockings off.  Your toes or feet become cold and blue.  You develop open sores or red  spots on your legs that do not go away.  You see or feel a warm spot on your leg.  You have new swelling or soreness in your leg.  You are short of breath or you have chest pain for no reason.  You have a rapid or irregular heartbeat.  You feel light-headed or dizzy. This information is not intended to replace advice given to you by your health care provider. Make sure you discuss any questions you have with your health care provider. Document Released: 09/25/2009 Document Revised: 04/27/2016 Document Reviewed: 11/05/2014 Elsevier Interactive Patient Education  2017 [employer].

## 2018-01-18 NOTE — Progress Notes (Signed)
Cardiology Office Note   Date:  01/18/2018   ID:  Lynetta MareBetty J Gibson, DOB [birth year], MRN [id number]  PCP:  Corwin LevinsJohn, James W, MD  Cardiologist:  Winfield Rastr.Crenshaw  No chief complaint on file.    History of Present Illness: Erika Gibson is a 84 y.o. female who presents for ongoing assessment and management of chest pain with 4/17 demonstrating normal LV function with grade 1 diastolic dysfunction, trace AI, mild MR. the patient was last seen in the office on 08/28/2017 by Dr. Jens Somrenshaw.  At that time, the patient was clinically stable from a cardiology standpoint, he was not planned for any further ischemic evaluation.  She comes today with complaints of chronic LEE. She has significant LE varicose veins and mild venous insufficieny skin changes. She denies pain in her feet, calves or ankles. She has fallen a second time since being seen last. She has a large dog that she cannot handle and has been pulled off of her feet on two occasions when walking him. She plans on giving him away.    Past Medical History:  Diagnosis Date  . Anxiety state, unspecified   . ASTHMA   . Colitis, collagenous 2010  . COLONIC POLYPS, HX OF 06/2002   adenomatous polyp  . DEPRESSION   . HYPERLIPIDEMIA   . HYPERTENSION   . Nephrolithiasis   . Osteopenia   . Substance abuse [facility](HCC)     Past Surgical History:  Procedure Laterality Date  . ANKLE SURGERY     removed pins from ankle bone  . BREAST SURGERY     Implants  . DILATION AND CURETTAGE OF UTERUS    . TONSILLECTOMY       Current Outpatient Medications  Medication Sig Dispense Refill  . alendronate (FOSAMAX) 70 MG tablet Take 1 tablet (70 mg total) by mouth every 7 (seven) days. Take with a full glass of water on an empty stomach. 12 tablet 3  . aspirin EC 81 MG tablet Take 81 mg by mouth daily.    Marland Kitchen. atorvastatin (LIPITOR) 10 MG tablet Take 10 mg by mouth daily.      . Cholecalciferol (VITAMIN D PO) Take by mouth.    Marland Kitchen. lisinopril (PRINIVIL,ZESTRIL) 10 MG tablet  Take 10 mg by mouth daily.      . Multiple Vitamin (MULTIVITAMIN) tablet Take 1 tablet by mouth daily.      Marland Kitchen. triamterene-hydrochlorothiazide (MAXZIDE-25) 37.5-25 MG per tablet Take 1 tablet by mouth daily.       No current facility-administered medications for this visit.     Allergies:   Mesalamine    Social History:  The patient  reports that she has quit smoking. she has never used smokeless tobacco. She reports that she does not drink alcohol or use drugs.   Family History:  The patient's family history includes Alzheimer's disease in her father, mother, and other; Breast cancer in her paternal aunt; Colon polyps in her brother; Heart disease in her father.    ROS: All other systems are reviewed and negative. Unless otherwise mentioned in H&P    PHYSICAL EXAM: VS:  There were no vitals taken for this visit. , BMI There is no height or weight on file to calculate BMI. GEN: Well nourished, well developed, in no acute distress  HEENT: normal  Neck: no JVD,bilateal  carotid bruits R>L no masses Cardiac:RRR; no murmurs, rubs, or gallops, 1+ dependent  edema bilaterally, L>R. Varicose veins noted, especially in the ankles and feet Discolored and thickened  toe nails.  Respiratory:  clear to auscultation bilaterally, normal work of breathing GI: soft, nontender, nondistended, + BS MS: no deformity or atrophy  Skin: warm and dry, no rash Neuro:  Strength and sensation are intact Psych: euthymic mood, full affect  Recent Labs: 02/10/2017: ALT 17; TSH 3.93 04/18/2017: BUN 23; Creatinine, Ser 1.15; Hemoglobin 13.4; Platelets 236.0; Potassium 4.7; Sodium 140    Lipid Panel    Component Value Date/Time   CHOL 160 02/10/2017 0832   TRIG 99.0 02/10/2017 0832   HDL 59.20 02/10/2017 0832   CHOLHDL 3 02/10/2017 0832   VLDL 19.8 02/10/2017 0832   LDLCALC 81 02/10/2017 0832      Wt Readings from Last 3 Encounters:  10/19/17 123 lb (55.8 kg)  10/05/17 123 lb (55.8 kg)  09/15/17 120 lb  (54.4 kg)      ASSESSMENT AND PLAN:  1.  Chronic LEE: Venous insufficiency is prominent especially in the ankles and feet. Dependent edema is noted. I have suggested ankle length TED hose for assistance in venous return and LEE. She will be measured for these hose. No evidence of DVT on examination   2. Bilateral carotid bruits: Noted on exam. R>L. I will plan for carotid doppler ultrasound for further evaluation.   Current medicines are reviewed at length with the patient today.    Labs/ tests ordered today include:Corotid Dopplers   Erika Gibson. Erika Gibson, ANP, AACC   01/18/2018 7:39 AM     [facility] [address], [place], [place] [address] Phone: [phone]; Fax: [phone]

## 2018-01-25 ENCOUNTER — Ambulatory Visit (HOSPITAL_COMMUNITY)
Admission: RE | Admit: 2018-01-25 | Discharge: 2018-01-25 | Disposition: A | Discharge status: Home / Self Care | Payer: Medicare Other | Source: Ambulatory Visit | Attending: Cardiology | Admitting: Cardiology

## 2018-01-25 DIAGNOSIS — R0989 Other specified symptoms and signs involving the circulatory and respiratory systems: Secondary | ICD-10-CM | POA: Insufficient documentation

## 2018-01-25 DIAGNOSIS — I6523 Occlusion and stenosis of bilateral carotid arteries: Secondary | ICD-10-CM | POA: Insufficient documentation

## 2018-01-26 ENCOUNTER — Telehealth: Payer: Self-pay | Admitting: Internal Medicine

## 2018-01-26 DIAGNOSIS — M2550 Pain in unspecified joint: Secondary | ICD-10-CM

## 2018-01-26 NOTE — Telephone Encounter (Signed)
Rheum referral done per pt request, she should hear hopefully soon

## 2018-01-29 ENCOUNTER — Other Ambulatory Visit: Payer: Self-pay

## 2018-01-29 DIAGNOSIS — R0989 Other specified symptoms and signs involving the circulatory and respiratory systems: Secondary | ICD-10-CM

## 2018-01-29 DIAGNOSIS — E785 Hyperlipidemia, unspecified: Secondary | ICD-10-CM

## 2018-01-30 IMAGING — CT CT HEAD W/O CM
4 of 7 series · 17 of 47 positions shown, 19 images · non-contrast
Comparison: None.

CLINICAL DATA: Fall 1 week ago.  Hit head and face.

EXAM:
CT HEAD WITHOUT CONTRAST
CT MAXILLOFACIAL WITHOUT CONTRAST
TECHNIQUE: Multidetector CT imaging of the head and maxillofacial structures
were performed using the standard protocol without intravenous
contrast. Multiplanar CT image reconstructions of the maxillofacial
structures were also generated.

[Series 4: head 5.0 h37s · axial · 0.41mm/px · z∈[+133,+238]mm · 7 of 29 slices shown, 9 images]
[im 4/29  brain]
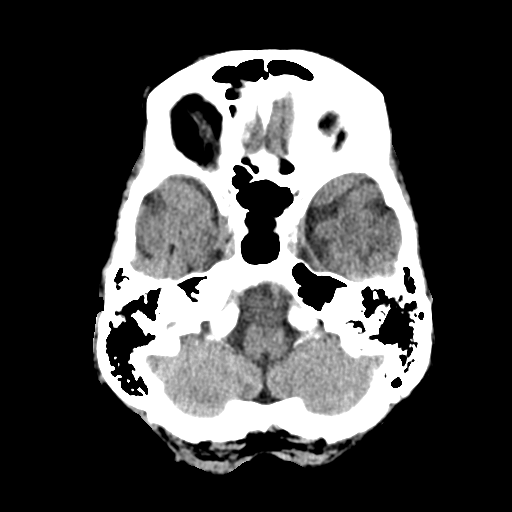
[im 4/29  bone]
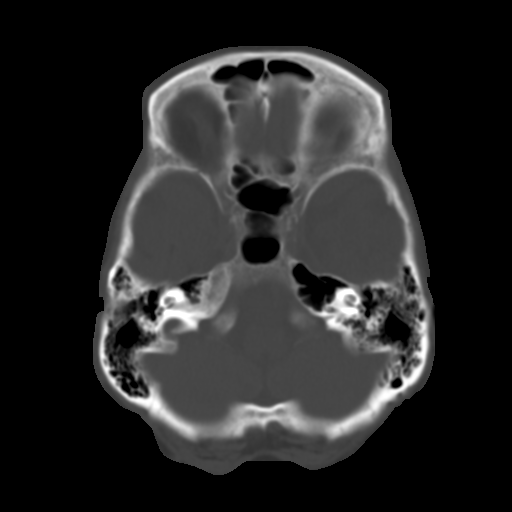
[im 8/29  brain]
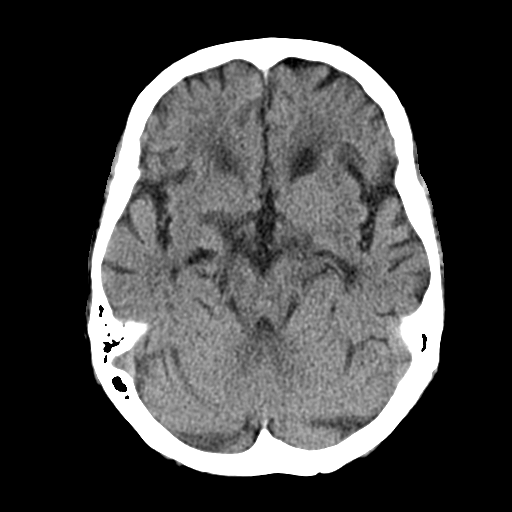
[im 11/29  brain]
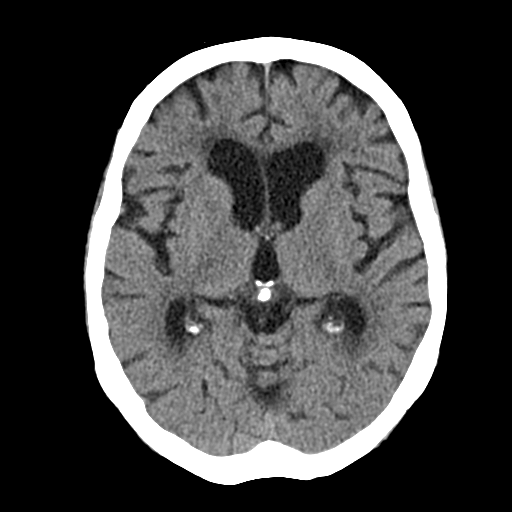
[im 15/29  brain]
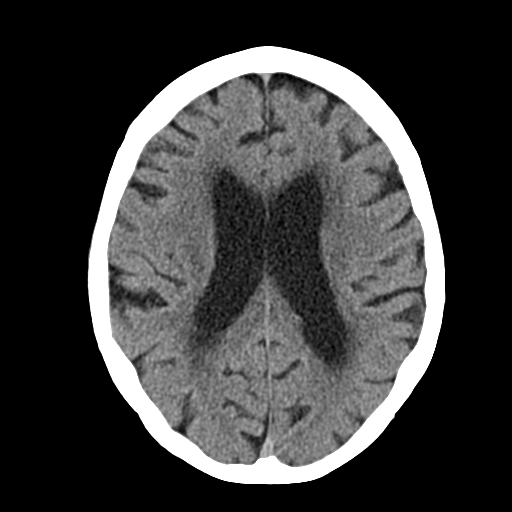
[im 18/29  brain]
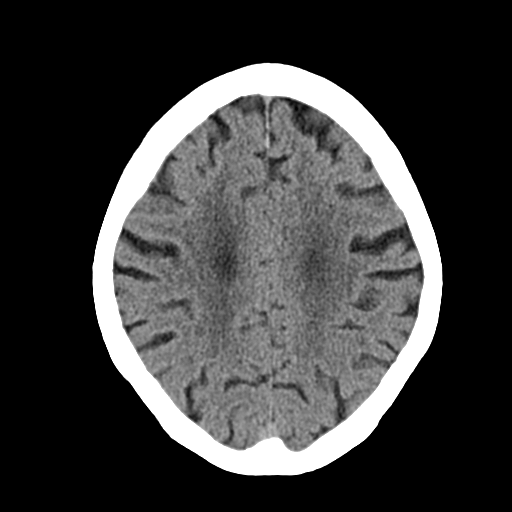
[im 18/29  bone]
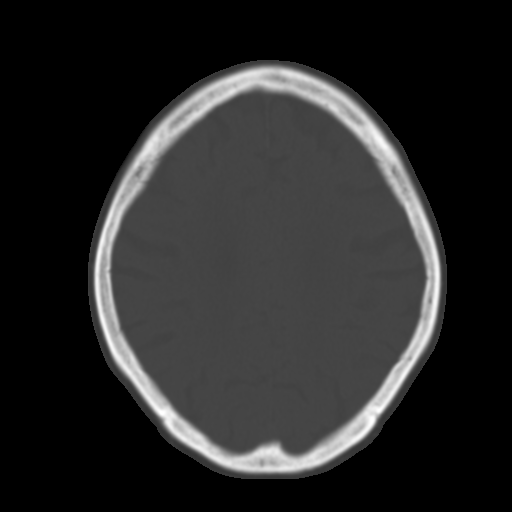
[im 22/29  brain]
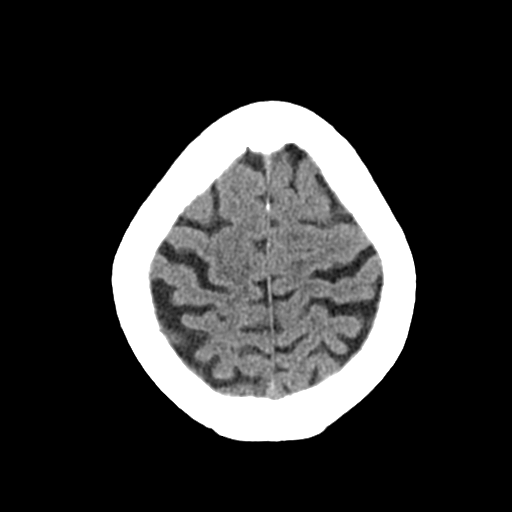
[im 25/29  brain]
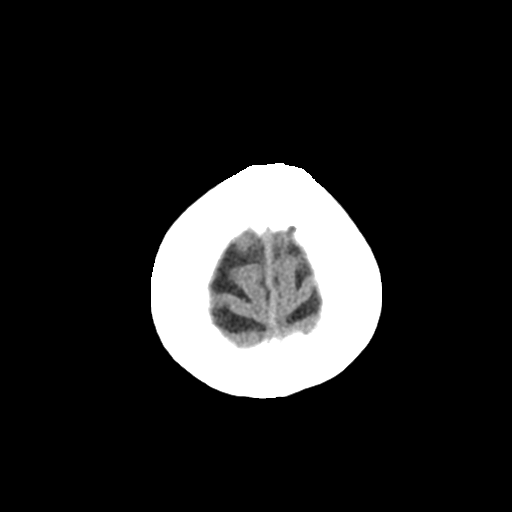

[Series 7: facial/ orbits 2.0 h30s · axial · 0.30mm/px · z∈[+28,+104]mm · 6 of 64 slices shown]
[im 7/64  brain]
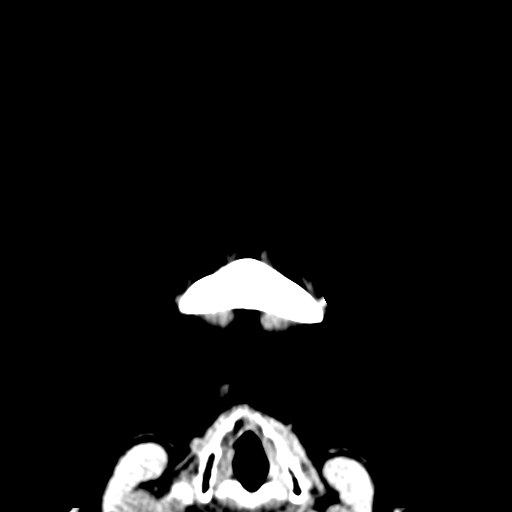
[im 13/64  brain]
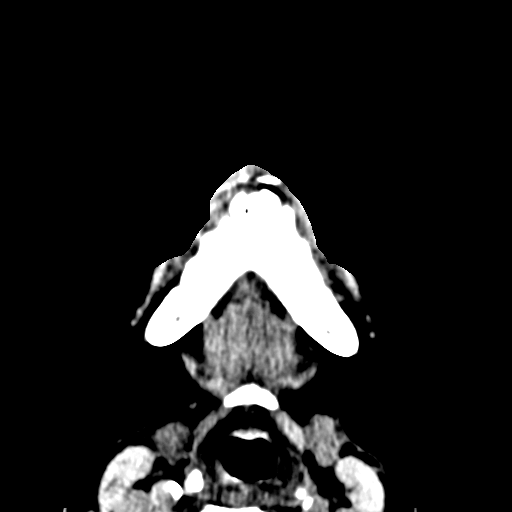
[im 19/64  brain]
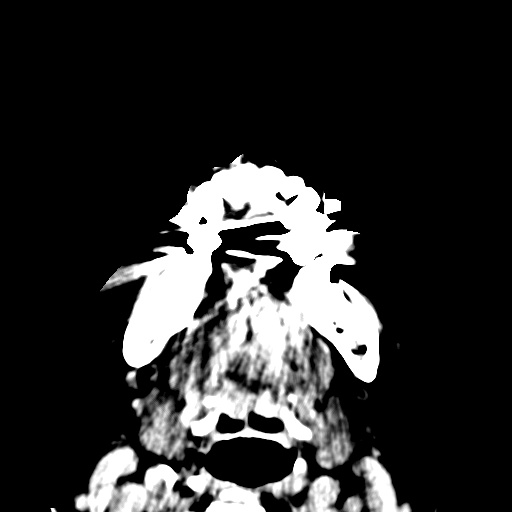
[im 29/64  brain]
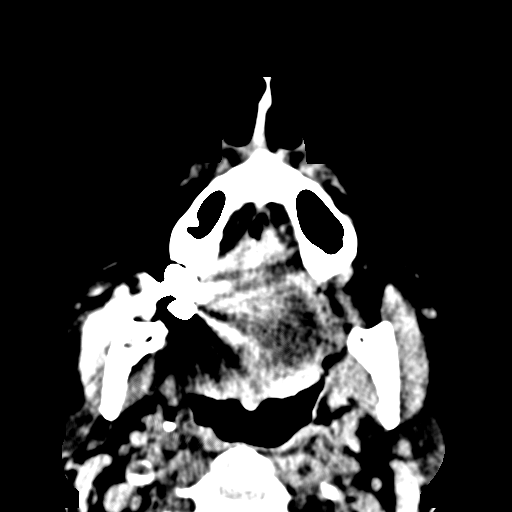
[im 35/64  brain]
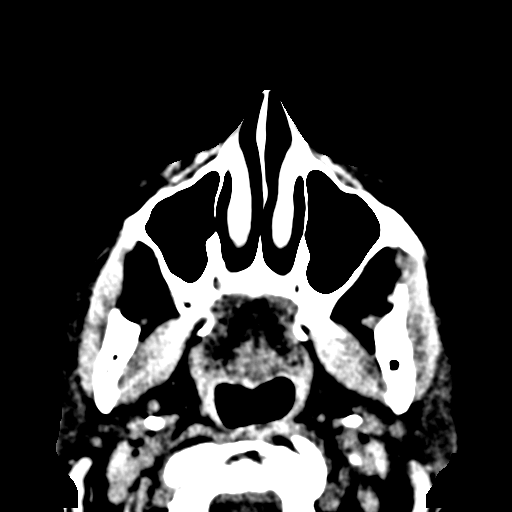
[im 45/64  brain]
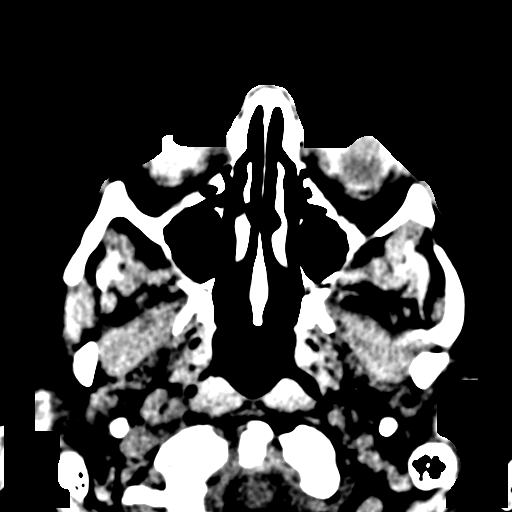

[Series 13: coronal soft tissue · coronal · 0.26mm/px · 2 of 91 slices shown]
[im 31/91  brain]
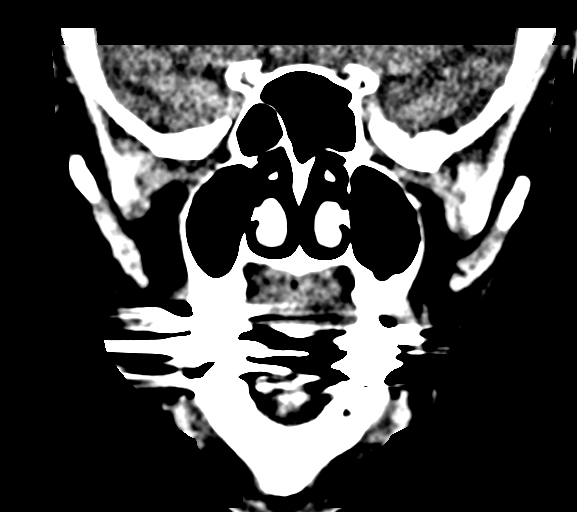
[im 61/91  brain]
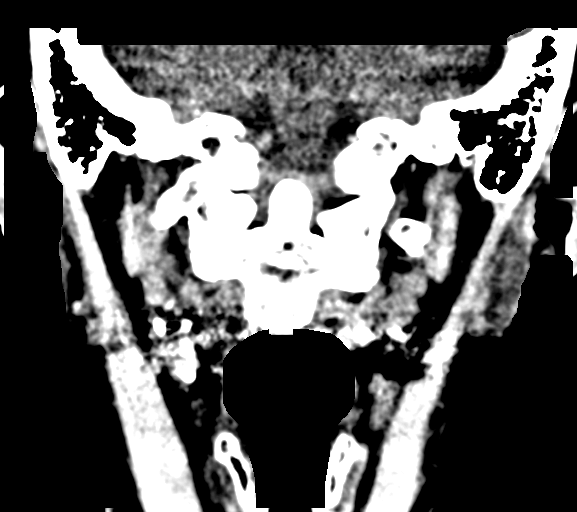

[Series 14: sagittal soft tissue · sagittal · 0.27mm/px · 2 of 70 slices shown]
[im 24/70  brain]
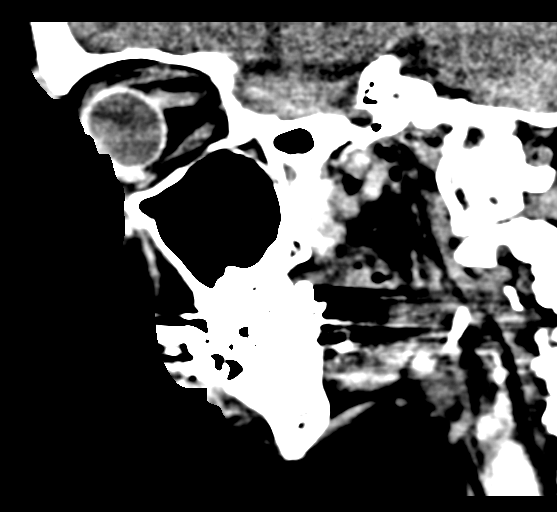
[im 47/70  brain]
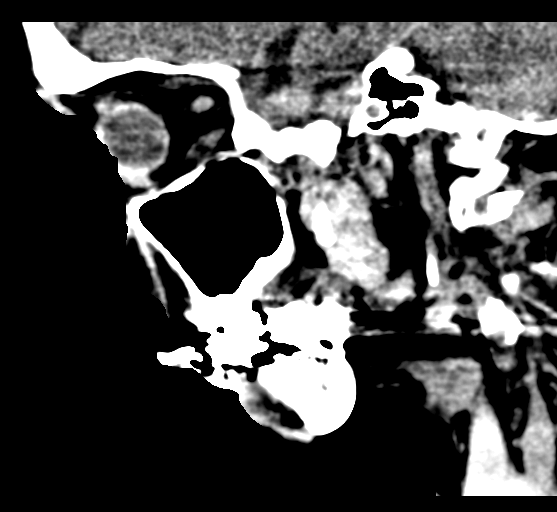

[17 of 47 positions shown; findings below may reference images not displayed]

FINDINGS: CT HEAD FINDINGS

Brain: There is atrophy and chronic small vessel disease changes. No
acute intracranial abnormality. Specifically, no hemorrhage,
hydrocephalus, mass lesion, acute infarction, or significant
intracranial injury.

Vascular: No hyperdense vessel or unexpected calcification.

Skull: No acute calvarial abnormality.

Other: None

CT MAXILLOFACIAL FINDINGS

Osseous: No fracture or mandibular dislocation. No destructive
process.

Orbits: Negative. No traumatic or inflammatory finding.

Sinuses: Clear

Soft tissues: Negative
IMPRESSION: No acute intracranial abnormality.Atrophy, chronic microvascular
disease.

No evidence of facial fracture.

## 2018-02-14 NOTE — Progress Notes (Signed)
Cardiology Office Note   Date:  02/19/2018   ID:  Erika Gibson, DOB 1927-12-23, MRN [id number]  PCP:  Corwin LevinsJohn, James W, MD  Cardiologist: Dr. Jens Somrenshaw Chief Complaint  Patient presents with  . Follow-up     History of Present Illness: Erika Gibson is a 84 y.o. female who presents for Erika GashBetty J Gibson is a 84 y.o. female who presents for ongoing assessment and management of chest pain with 4/17 demonstrating normal LV function with grade 1 diastolic dysfunction, trace AI, mild MR. he was last seen in the office on 01/18/2018 with complaints of chronic lower extremity edema in the setting of significant lower extremity varicose vein with mild venous insufficiency skin changes. The patient had fallen on multiple occasions, usually associated with walking her dog and the dog pulled on her and she. She was planning on finding a new home for the dog.  On the last office visit the patient was recommended to have TED hose (venous return and lower extremity edema. She was remeasured to assure proper fit. He was also noted to have bilateral carotid bruits, and carotid ultrasound was ordered to evaluate further.   Difficult imaging due to tortuous mid/distal ICA segments.  Final Interpretation: Right Carotid: Velocities in the right ICA are consistent with a 40-59%        stenosis. Non-hemodynamically significant plaque <50% noted in        the CCA.  Left Carotid: Velocities in the left ICA are consistent with a 1-39% stenosis.       Non-hemodynamically significant plaque noted in the CCA.  Vertebrals: Both vertebral arteries were patent with antegrade flow. Subclavians: Normal flow hemodynamics were seen in bilateral subclavian       arteries.  Patient today with no new complaints. She does continue to have symptoms of asthma on occasion especially when running across a room to answer phone. He states that he has some coughing with some phlegm, but no hemoptysis.    Past Medical History:  Diagnosis Date  . Anxiety state, unspecified   . ASTHMA   . Colitis, collagenous 2010  . COLONIC POLYPS, HX OF 06/2002   adenomatous polyp  . DEPRESSION   . HYPERLIPIDEMIA   . HYPERTENSION   . Nephrolithiasis   . Osteopenia   . Substance abuse [facility](HCC)     Past Surgical History:  Procedure Laterality Date  . ANKLE SURGERY     removed pins from ankle bone  . BREAST SURGERY     Implants  . DILATION AND CURETTAGE OF UTERUS    . TONSILLECTOMY       Current Outpatient Medications  Medication Sig Dispense Refill  . aspirin EC 81 MG tablet Take 81 mg by mouth daily.    Marland Kitchen. atorvastatin (LIPITOR) 10 MG tablet Take 10 mg by mouth daily.      . Cholecalciferol (VITAMIN D PO) Take by mouth.    . DOXYLAMINE SUCCINATE PO Take 25 mg by mouth at bedtime.    Marland Kitchen. lisinopril (PRINIVIL,ZESTRIL) 10 MG tablet Take 10 mg by mouth daily.      . Multiple Vitamin (MULTIVITAMIN) tablet Take 1 tablet by mouth daily.      Marland Kitchen. triamterene-hydrochlorothiazide (MAXZIDE-25) 37.5-25 MG per tablet Take 1 tablet by mouth daily.       No current facility-administered medications for this visit.     Allergies:   Mesalamine    Social History:  The patient  reports that she has quit smoking. she has never  used smokeless tobacco. She reports that she does not drink alcohol or use drugs.   Family History:  The patient's family history includes Alzheimer's disease in her father, mother, and other; Breast cancer in her paternal aunt; Colon polyps in her brother; Heart disease in her father.    ROS: All other systems are reviewed and negative. Unless otherwise mentioned in H&P    PHYSICAL EXAM: VS:  BP 138/78   Pulse 81   Ht 5\' 4"  (1.626 m)   Wt 123 lb 6.4 oz (56 kg)   BMI 21.18 kg/m  , BMI Body mass index is 21.18 kg/m. GEN: Well nourished, well developed, in no acute distress  HEENT: normal  Neck: no JVD, bilateral carotid bruits, right greater than left, no masses Cardiac: RRR;  no murmurs, rubs, or gallops,no edema  Respiratory:  clear to auscultation bilaterally, normal work of breathing GI: soft, nontender, nondistended, + BS MS: no deformity or atrophy  Skin: warm and dry, no rash Neuro:  Strength and sensation are intact Psych: euthymic mood, full affect   Recent Labs: 04/18/2017: BUN 23; Creatinine, Ser 1.15; Hemoglobin 13.4; Platelets 236.0; Potassium 4.7; Sodium 140    Lipid Panel    Component Value Date/Time   CHOL 160 02/10/2017 0832   TRIG 99.0 02/10/2017 0832   HDL 59.20 02/10/2017 0832   CHOLHDL 3 02/10/2017 0832   VLDL 19.8 02/10/2017 0832   LDLCALC 81 02/10/2017 0832      Wt Readings from Last 3 Encounters:  02/19/18 123 lb 6.4 oz (56 kg)  01/18/18 127 lb (57.6 kg)  10/19/17 123 lb (55.8 kg)      ASSESSMENT AND PLAN:  1. Hypertension: Fairly well-controlled on medication regimen would not make any changes at this time.  2. Carotid artery disease: Most recent Doppler ultrasound did not reveal any significant stenosis on the left, with 39-50% stenosis on the right.  No changes to current medication regimen.  3.  Chronic dyspnea: She has a history of asthma, and has been noticing that her breathing status is worsened if she rushes or walks quickly.  She becomes very short of breath and begins to cough up phlegm.  I advised her to follow-up with her PCP and possibly be referred to pulmonology.  She is not on any respiratory medications at this time.   Current medicines are reviewed at length with the patient today.    Labs/ tests ordered today include: None Bettey Mare. Liborio Nixon, ANP, AACC   02/19/2018 12:53 PM    [facility] [address] [place], [place] [address] Phone: [phone]; Fax: [phone]

## 2018-02-19 ENCOUNTER — Ambulatory Visit (INDEPENDENT_AMBULATORY_CARE_PROVIDER_SITE_OTHER): Payer: Medicare Other | Admitting: Adult Health

## 2018-02-19 ENCOUNTER — Encounter: Payer: Self-pay | Admitting: Adult Health

## 2018-02-19 VITALS — BP 138/78 | HR 81 | Ht 64.0 in | Wt 123.4 lb

## 2018-02-19 DIAGNOSIS — R0602 Shortness of breath: Secondary | ICD-10-CM

## 2018-02-19 DIAGNOSIS — I10 Essential (primary) hypertension: Secondary | ICD-10-CM

## 2018-02-19 DIAGNOSIS — R0989 Other specified symptoms and signs involving the circulatory and respiratory systems: Secondary | ICD-10-CM | POA: Diagnosis not present

## 2018-02-19 NOTE — Patient Instructions (Signed)
Medication Instructions:  NO CHANGES- Your physician recommends that you continue on your current medications as directed. Please refer to the Current Medication list given to you today.  If you need a refill on your cardiac medications before your next appointment, please call your pharmacy.  Follow-Up: Your physician wants you to follow-up in: 6 months with Dr Sherryll Burgerrenchaw You should receive a reminder letter in the mail two months in advance. If you do not receive a letter, please call our office 06-2018 to schedule the 08-2018 follow-up appointment.   Thank you for choosing [facility]!!

## 2018-02-26 DIAGNOSIS — Z6822 Body mass index (BMI) 22.0-22.9, adult: Secondary | ICD-10-CM | POA: Diagnosis not present

## 2018-02-26 DIAGNOSIS — M545 Low back pain: Secondary | ICD-10-CM | POA: Diagnosis not present

## 2018-02-26 DIAGNOSIS — M7918 Myalgia, other site: Secondary | ICD-10-CM | POA: Diagnosis not present

## 2018-02-26 DIAGNOSIS — M150 Primary generalized (osteo)arthritis: Secondary | ICD-10-CM | POA: Diagnosis not present

## 2018-03-14 ENCOUNTER — Encounter: Payer: Self-pay | Admitting: Podiatry

## 2018-03-14 ENCOUNTER — Ambulatory Visit (INDEPENDENT_AMBULATORY_CARE_PROVIDER_SITE_OTHER): Payer: Medicare Other | Admitting: Podiatry

## 2018-03-14 DIAGNOSIS — B351 Tinea unguium: Secondary | ICD-10-CM | POA: Diagnosis not present

## 2018-03-14 DIAGNOSIS — Q828 Other specified congenital malformations of skin: Secondary | ICD-10-CM

## 2018-03-14 DIAGNOSIS — M79675 Pain in left toe(s): Secondary | ICD-10-CM

## 2018-03-14 DIAGNOSIS — M79674 Pain in right toe(s): Secondary | ICD-10-CM

## 2018-03-14 NOTE — Progress Notes (Signed)
Patient ID: Hiram GashBetty J Austad, female   DOB: Jun 18, 1928, 84 y.o.   MRN: [id number] Patient ID: Hiram GashBetty J Brillhart, female   DOB: Jun 18, 1928, 84 y.o.   MRN: [id number] Complaint:  Visit Type: Patient returns to my office for continued preventative foot care services. Complaint: Patient states" my nails have grown long and thick and become painful to walk and wear shoes" . The patient presents for preventative foot care services. No changes to ROS  Podiatric Exam: Vascular: dorsalis pedis and posterior tibial pulses are palpable bilateral. Capillary return is immediate. Temperature gradient is WNL. Skin turgor WNL . Varicosities noted feet B/L Sensorium: Normal Semmes Weinstein monofilament test. Normal tactile sensation bilaterally. Nail Exam: Pt has thick disfigured discolored nails with subungual debris noted bilateral entire nail hallux through fifth toenails Ulcer Exam: There is no evidence of ulcer or pre-ulcerative changes or infection. Orthopedic Exam: Muscle tone and strength are WNL. No limitations in general ROM. No crepitus or effusions noted. Hammer toes 2-5  B/L Skin:  Porokeratosis sub 3 right, asymptomatic No infection or ulcers  Diagnosis:  Onychomycosis, , Pain in right toe, pain in left toes.  Porokeratosis sub 3 right  Treatment & Plan Procedures and Treatment: Consent by patient was obtained for treatment procedures. The patient understood the discussion of treatment and procedures well. All questions were answered thoroughly reviewed. Debridement of mycotic and hypertrophic toenails, 1 through 5 bilateral and clearing of subungual debris. No ulceration, no infection noted. . Debride porokeratosis.  ABN signed for 2019   Return Visit-Office Procedure: Patient instructed to return to the office for a follow up visit 3 months for continued evaluation and treatment.    Helane GuntherGregory Chantele Corado DPM

## 2018-03-21 ENCOUNTER — Other Ambulatory Visit (INDEPENDENT_AMBULATORY_CARE_PROVIDER_SITE_OTHER): Payer: Medicare Other

## 2018-03-21 ENCOUNTER — Ambulatory Visit (INDEPENDENT_AMBULATORY_CARE_PROVIDER_SITE_OTHER): Payer: Medicare Other | Admitting: Internal Medicine

## 2018-03-21 ENCOUNTER — Telehealth: Payer: Self-pay | Admitting: Internal Medicine

## 2018-03-21 ENCOUNTER — Ambulatory Visit (INDEPENDENT_AMBULATORY_CARE_PROVIDER_SITE_OTHER)
Admission: RE | Admit: 2018-03-21 | Discharge: 2018-03-21 | Disposition: A | Discharge status: Home / Self Care | Payer: Medicare Other | Source: Ambulatory Visit | Attending: Internal Medicine | Admitting: Internal Medicine

## 2018-03-21 ENCOUNTER — Encounter: Payer: Self-pay | Admitting: Internal Medicine

## 2018-03-21 VITALS — BP 122/80 | HR 77 | Temp 98.0°F | Ht 64.0 in | Wt 127.0 lb

## 2018-03-21 DIAGNOSIS — M5416 Radiculopathy, lumbar region: Secondary | ICD-10-CM | POA: Insufficient documentation

## 2018-03-21 DIAGNOSIS — M25551 Pain in right hip: Secondary | ICD-10-CM

## 2018-03-21 DIAGNOSIS — F32A Depression, unspecified: Secondary | ICD-10-CM

## 2018-03-21 DIAGNOSIS — I10 Essential (primary) hypertension: Secondary | ICD-10-CM

## 2018-03-21 DIAGNOSIS — E785 Hyperlipidemia, unspecified: Secondary | ICD-10-CM | POA: Diagnosis not present

## 2018-03-21 DIAGNOSIS — R739 Hyperglycemia, unspecified: Secondary | ICD-10-CM

## 2018-03-21 DIAGNOSIS — F329 Major depressive disorder, single episode, unspecified: Secondary | ICD-10-CM

## 2018-03-21 DIAGNOSIS — M25552 Pain in left hip: Secondary | ICD-10-CM | POA: Diagnosis not present

## 2018-03-21 DIAGNOSIS — M160 Bilateral primary osteoarthritis of hip: Secondary | ICD-10-CM | POA: Diagnosis not present

## 2018-03-21 DIAGNOSIS — N189 Chronic kidney disease, unspecified: Secondary | ICD-10-CM | POA: Diagnosis not present

## 2018-03-21 LAB — URINALYSIS, ROUTINE W REFLEX MICROSCOPIC
BILIRUBIN URINE: NEGATIVE
Hgb urine dipstick: NEGATIVE
KETONES UR: NEGATIVE
Leukocytes, UA: NEGATIVE
NITRITE: NEGATIVE
RBC / HPF: NONE SEEN (ref 0–?)
SPECIFIC GRAVITY, URINE: 1.015 (ref 1.000–1.030)
Total Protein, Urine: NEGATIVE
UROBILINOGEN UA: 0.2 (ref 0.0–1.0)
Urine Glucose: NEGATIVE
WBC UA: NONE SEEN (ref 0–?)
pH: 7.5 (ref 5.0–8.0)

## 2018-03-21 LAB — CBC WITH DIFFERENTIAL/PLATELET
Basophils Absolute: 0.1 10*3/uL (ref 0.0–0.1)
Basophils Relative: 1.1 % (ref 0.0–3.0)
EOS PCT: 4.5 % (ref 0.0–5.0)
Eosinophils Absolute: 0.3 10*3/uL (ref 0.0–0.7)
HCT: 41 % (ref 36.0–46.0)
Hemoglobin: 13.9 g/dL (ref 12.0–15.0)
Lymphocytes Relative: 25.2 % (ref 12.0–46.0)
Lymphs Abs: 1.9 10*3/uL (ref 0.7–4.0)
MCHC: 34 g/dL (ref 30.0–36.0)
MCV: 91.1 fl (ref 78.0–100.0)
MONO ABS: 0.8 10*3/uL (ref 0.1–1.0)
MONOS PCT: 10.8 % (ref 3.0–12.0)
Neutro Abs: 4.4 10*3/uL (ref 1.4–7.7)
Neutrophils Relative %: 58.4 % (ref 43.0–77.0)
Platelets: 265 10*3/uL (ref 150.0–400.0)
RBC: 4.5 Mil/uL (ref 3.87–5.11)
RDW: 14.6 % (ref 11.5–15.5)
WBC: 7.5 10*3/uL (ref 4.0–10.5)

## 2018-03-21 LAB — LIPID PANEL
Cholesterol: 161 mg/dL (ref 0–200)
HDL: 62.5 mg/dL (ref >39.00)
LDL Cholesterol: 76 mg/dL (ref 0–99)
NONHDL: 98.55
Total CHOL/HDL Ratio: 3
Triglycerides: 111 mg/dL (ref 0.0–149.0)
VLDL: 22.2 mg/dL (ref 0.0–40.0)

## 2018-03-21 LAB — BASIC METABOLIC PANEL
BUN: 27 mg/dL — ABNORMAL HIGH (ref 6–23)
CO2: 31 mEq/L (ref 19–32)
Calcium: 10.8 mg/dL — ABNORMAL HIGH (ref 8.4–10.5)
Chloride: 102 mEq/L (ref 96–112)
Creatinine, Ser: 1.17 mg/dL (ref 0.40–1.20)
GFR: 46.43 mL/min — AB (ref >60.00)
GLUCOSE: 93 mg/dL (ref 70–99)
Potassium: 4 mEq/L (ref 3.5–5.1)
SODIUM: 140 meq/L (ref 135–145)

## 2018-03-21 LAB — HEMOGLOBIN A1C: HEMOGLOBIN A1C: 5.8 % (ref 4.6–6.5)

## 2018-03-21 LAB — TSH: TSH: 2.44 u[IU]/mL (ref 0.35–4.50)

## 2018-03-21 LAB — HEPATIC FUNCTION PANEL
ALT: 20 U/L (ref 0–35)
AST: 24 U/L (ref 0–37)
Albumin: 4.2 g/dL (ref 3.5–5.2)
Alkaline Phosphatase: 53 U/L (ref 39–117)
BILIRUBIN TOTAL: 0.7 mg/dL (ref 0.2–1.2)
Bilirubin, Direct: 0.1 mg/dL (ref 0.0–0.3)
Total Protein: 7.1 g/dL (ref 6.0–8.3)

## 2018-03-21 MED ORDER — TELMISARTAN 40 MG PO TABS: 40.0000 mg | ORAL_TABLET | Freq: Every day | ORAL | 3 refills | Status: DC

## 2018-03-21 MED ORDER — ATORVASTATIN CALCIUM 10 MG PO TABS: 10.0000 mg | ORAL_TABLET | Freq: Every day | ORAL | 3 refills | Status: AC

## 2018-03-21 NOTE — Progress Notes (Signed)
Subjective:    Patient ID: Erika Gibson, female    DOB: 24-May-1928, 84 y.o.   MRN: [id number]  HPI Here for yearly f/u;  Overall doing ok;  Pt denies Chest pain, worsening SOB, DOE, wheezing, orthopnea, PND, worsening LE edema, palpitations, dizziness or syncope.  Pt denies neurological change such as new headache, facial or extremity weakness.  Pt denies polydipsia, polyuria, or low sugar symptoms. Pt states overall good compliance with treatment and medications, good tolerability, and has been trying to follow appropriate diet.  Pt denies worsening depressive symptoms, suicidal ideation or panic. No fever, night sweats, wt loss, loss of appetite, or other constitutional symptoms.  Pt states good ability with ADL's, has low fall risk, home safety reviewed and adequate, no other significant changes in hearing or vision, and not active with exercise.  Does also have occasional urinary incontinence, and wondering if exac by her maxide, and she is ok with med change, I suggested d/c maxide and ACE in favor of micaris 40.  Has also been seeing sports med and has better but persistent hip pain that can act up more later in the day with achy; did see a rheumatology who suggested her problem was more related to lower back/spine.  She is concerned about masking the pain rather than resolving it.  Has 2 occasions of falling related to left leg numbness or strength, had to pull herself up with the right leg, with the last fall about 2 mo ago.  Still with persistent pain she calls hip pain but points to the bilat lower buttock area worse.  Hsa been trying to not take the ibuprofen as it seems to make her sleepy, except for nighttime if she wakes up with pain.  Has had mild worsening depressive symptoms, but no suicidal ideation, or panic; has ongoing anxiety. Son lives with her, is alcoholic, though did move out recently.  No other interval hx or new complaint Past Medical History:  Diagnosis Date  . Anxiety state,  unspecified   . ASTHMA   . Colitis, collagenous 2010  . COLONIC POLYPS, HX OF 06/2002   adenomatous polyp  . DEPRESSION   . HYPERLIPIDEMIA   . HYPERTENSION   . Nephrolithiasis   . Osteopenia   . Substance abuse [facility])    Past Surgical History:  Procedure Laterality Date  . ANKLE SURGERY     removed pins from ankle bone  . BREAST SURGERY     Implants  . DILATION AND CURETTAGE OF UTERUS    . TONSILLECTOMY      reports that she has quit smoking. She has never used smokeless tobacco. She reports that she does not drink alcohol or use drugs. family history includes Alzheimer's disease in her father, mother, and other; Breast cancer in her paternal aunt; Colon polyps in her brother; Heart disease in her father. Allergies  Allergen Reactions  . Mesalamine     REACTION: fuzzy headed and unsteady   Current Outpatient Medications on File Prior to Visit  Medication Sig Dispense Refill  . aspirin EC 81 MG tablet Take 81 mg by mouth daily.    . Cholecalciferol (VITAMIN D PO) Take by mouth.    . DOXYLAMINE SUCCINATE PO Take 25 mg by mouth at bedtime.    . Multiple Vitamin (MULTIVITAMIN) tablet Take 1 tablet by mouth daily.       No current facility-administered medications on file prior to visit.    Review of Systems  Constitutional: Negative for other  unusual diaphoresis or sweats HENT: Negative for ear discharge or swelling Eyes: Negative for other worsening visual disturbances Respiratory: Negative for stridor or other swelling  Gastrointestinal: Negative for worsening distension or other blood Genitourinary: Negative for retention or other urinary change Musculoskeletal: Negative for other MSK pain or swelling Skin: Negative for color change or other new lesions Neurological: Negative for worsening tremors and other numbness  Psychiatric/Behavioral: Negative for worsening agitation or other fatigue All other system neg per pt    Objective:   Physical Exam BP 122/80   Pulse 77    Temp 98 F (36.7 C) (Oral)   Ht 5\' 4"  (1.626 m)   Wt 127 lb (57.6 kg)   SpO2 97%   BMI 21.80 kg/m  VS noted,  Constitutional: Pt appears in NAD HENT: Head: NCAT.  Right Ear: External ear normal.  Left Ear: External ear normal.  Eyes: . Pupils are equal, round, and reactive to light. Conjunctivae and EOM are normal Nose: without d/c or deformity Neck: Neck supple. Gross normal ROM Cardiovascular: Normal rate and regular rhythm.   Pulmonary/Chest: Effort normal and breath sounds without rales or wheezing.  Abd:  Soft, NT, ND, + BS, no organomegaly Neurological: Pt is alert. At baseline orientation, motor grossly intact except for LLE with 4+/5 Skin: Skin is warm. No rashes, other new lesions, no LE edema Psychiatric: Pt behavior is normal without agitation , + depressed affect No other exam findings    Assessment & Plan:

## 2018-03-21 NOTE — Telephone Encounter (Signed)
Copied from CRM [id number]. Topic: Quick Communication - See Telephone Encounter >> Mar 21, 2018  4:01 PM Landry MellowFoltz, Melissa J wrote: CRM for notification. See Telephone encounter for: 03/21/18. Pt is asking wheter or not she needs ot continue to take lisinopril. Please call [phone]

## 2018-03-21 NOTE — Telephone Encounter (Signed)
Pt has been informed and expressed understanding.  

## 2018-03-21 NOTE — Telephone Encounter (Signed)
Please advise 

## 2018-03-21 NOTE — Telephone Encounter (Signed)
Copied from CRM [id number]. Topic: Quick Communication - Rx Refill/Question >> Mar 21, 2018  4:22 PM Darletta MollLander, Lumin L wrote: Medication: Lipitor Has the patient contacted their pharmacy? Yes.   (Agent: If no, request that the patient contact the pharmacy for the refill.) Preferred Pharmacy (with phone number or street name): [id number] - [place], [place] - [address] Agent: Please be advised that RX refills may take up to 3 business days. We ask that you follow-up with your pharmacy.  Patient wants to know why the Lipitor was refilled? She says it was not discussed at her appt today and she didn't need a refill.

## 2018-03-21 NOTE — Patient Instructions (Signed)
OK to stop the maxide and lsinopril  Please take all new medication as prescribed  - the Micardis 40 mg daily  Please continue all other medications as before, and refills have been done if requested.  Please have the pharmacy call with any other refills you may need.  Please continue your efforts at being more active, low cholesterol diet, and weight control.  You are otherwise up to date with prevention measures today.  Please keep your appointments with your specialists as you may have planned  You will be contacted regarding the referral for: MRI for the lower back  Please go to the XRAY Department in the Basement (go straight as you get off the elevator) for the x-ray testing  You will be contacted by phone if any changes need to be made immediately.  Otherwise, you will receive a letter about your results with an explanation, but please check with MyChart first.  Please remember to sign up for MyChart if you have not done so, as this will be important to you in the future with finding out test results, communicating by private email, and scheduling acute appointments online when needed.  Please return in 6 months, or sooner if needed

## 2018-03-21 NOTE — Telephone Encounter (Signed)
No, b/c today we stopped the lisinopril and maxide, in favor of micardis 40 mg daily only

## 2018-03-22 ENCOUNTER — Telehealth: Payer: Self-pay | Admitting: Adult Health

## 2018-03-22 NOTE — Telephone Encounter (Signed)
Left message to call back  

## 2018-03-22 NOTE — Telephone Encounter (Signed)
New message  Pt verbalzied that she is calling for RN  She wants to come in for medications management  However pt was just seen 02-19-2018   Please call and advise

## 2018-03-24 NOTE — Assessment & Plan Note (Addendum)
stable overall by history and exam, recent data reviewed with pt, and pt to change the diuretic/ACE to micardis 40 qd,,  to f/u any worsening symptoms or concerns BP Readings from Last 3 Encounters:  03/21/18 122/80  02/19/18 138/78  01/18/18 132/74   Note:  Total time for pt hx, exam, review of record with pt in the room, determination of diagnoses and plan for further eval and tx is > 40 min, with over 50% spent in coordination and counseling of patient including the differential dx, tx, further evaluation and other management of HTN, CKD, depressiion, HLD, lumbar radiculopathy

## 2018-03-24 NOTE — Assessment & Plan Note (Signed)
Also for hip films, f/o djd or other

## 2018-03-24 NOTE — Assessment & Plan Note (Signed)
Lab Results  Component Value Date   LDLCALC 76 03/21/2018  stable overall by history and exam, recent data reviewed with pt, and pt to continue medical treatment as before,  to f/u any worsening symptoms or concerns

## 2018-03-24 NOTE — Assessment & Plan Note (Signed)
Mild to mod, for MRI,  to f/u any worsening symptoms or concerns 

## 2018-03-24 NOTE — Assessment & Plan Note (Signed)
Lab Results  Component Value Date   CREATININE 1.17 03/21/2018  stable overall by history and exam, recent data reviewed with pt, and pt to continue medical treatment as before,  to f/u any worsening symptoms or concerns

## 2018-03-24 NOTE — Assessment & Plan Note (Signed)
Mild to mod, declines tx such as celexa 10 qd

## 2018-03-26 ENCOUNTER — Telehealth: Payer: Self-pay | Admitting: Cardiology

## 2018-03-26 NOTE — Telephone Encounter (Signed)
Returned call to patient of Dr. Jens Somrenshaw who is concerned about med changes her PCP made. Her lisinopril and micardis were discontinued by PCP on 4/10 and she was supposed to start micardis 40mg  daily. She reports she took her last doses of lisinopril & maxzide yesterday and did not plan to start new med until OK'ed by Dr. Jens Somrenshaw. She reports her meds were changed b/c she was having incontinence, fell twice, had numbness sin left leg/no feeling, and had balance problems. She does not monitor her BP/HR at home. Informed her that her PCP can adjust her BP medications just as her cardiologist can and she can follow Dr. Raphael GibneyJohn's directions but she would like Dr. Jens Somrenshaw to review. Informed her that he is out of the office and we will follow up with him response as soon as possible.   Routed to MD/RN

## 2018-03-26 NOTE — Telephone Encounter (Signed)
Another encounter opened concerning same issue and charted on. This note will be closed.

## 2018-03-26 NOTE — Telephone Encounter (Signed)
New message:     Pt is needing to advice on BP medication. Pt was on BP medication before but she states she took herself off of it due to falling and numbness of her leg

## 2018-03-26 NOTE — Telephone Encounter (Signed)
OK for med changes as outlined by Dr Jonny RuizJohn and follow BP Olga MillersBrian Crenshaw

## 2018-03-27 ENCOUNTER — Encounter: Payer: Self-pay | Admitting: Internal Medicine

## 2018-03-27 ENCOUNTER — Other Ambulatory Visit: Payer: Self-pay | Admitting: Internal Medicine

## 2018-03-27 ENCOUNTER — Ambulatory Visit
Admission: RE | Admit: 2018-03-27 | Discharge: 2018-03-27 | Disposition: A | Discharge status: Home / Self Care | Payer: Medicare Other | Source: Ambulatory Visit | Attending: Internal Medicine | Admitting: Internal Medicine

## 2018-03-27 DIAGNOSIS — M5416 Radiculopathy, lumbar region: Secondary | ICD-10-CM

## 2018-03-27 DIAGNOSIS — M48061 Spinal stenosis, lumbar region without neurogenic claudication: Secondary | ICD-10-CM | POA: Diagnosis not present

## 2018-03-27 NOTE — Telephone Encounter (Signed)
LMTCB

## 2018-03-28 ENCOUNTER — Telehealth: Payer: Self-pay | Admitting: Internal Medicine

## 2018-03-28 ENCOUNTER — Telehealth: Payer: Self-pay

## 2018-03-28 NOTE — Telephone Encounter (Signed)
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  We should refer asap to Neurosurgury, and you should hear from the office soon.    Oria Klimas to please inform pt, I will do referral

## 2018-03-28 NOTE — Telephone Encounter (Signed)
Called pt, LVM.   CRM created.  

## 2018-03-28 NOTE — Telephone Encounter (Signed)
FYI

## 2018-03-28 NOTE — Telephone Encounter (Signed)
Pt called for results. During call pt reports left leg "more swollen since off Maxzide." States "Could be from fall last Saturday.' Reports swelling of foot, ankle and knee "moderate." Left hip pain 8/10 with ambulating, "but varies."  No redness or warmth, no bruising. States pain and edema left leg "not new" but increased.  Declined appt/UC.  Pt states "I'm going to wait and see how it gets."

## 2018-03-29 NOTE — Telephone Encounter (Signed)
Spoke with pt, aware dr Jens Somcrenshaw agreed with med change.

## 2018-04-02 ENCOUNTER — Ambulatory Visit: Payer: Self-pay | Admitting: *Deleted

## 2018-04-02 NOTE — Telephone Encounter (Signed)
I returned her call.  I had a difficult time triaging her as she was jumping from one subject to the next in relation to her fall and her questions regarding the Micardis  She was started on.  She is experiencing constipation and continued elevated BP with the Micardis.  The highest BP was 183/95 and the lowest 171/83.  I was unable to get better clarification on when these readings were taken due to jumping all around in her conversation to different problems and subjects.        She mentioned she  fell a week ago Saturday. She hit her head, denied passing out unable to get more information from her regarding hitting her head as she started talking about twisting her left leg when she fell also.   Fell on concrete.   I slipped on my neighbors driveway.   There was some mud there.  (She was talking very fast and jumping around with her sentences but coherent but sounded anxious.  There was a dog barking in the background).   "I just don't know what to do". I attempted to get her to slow down and clarify what was going on in regards to each problem separately.      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   The doctor said it's in my back.  I've been referred to a neurosurgeon.  I have not heard from them yet.    I've been having pain in my left leg.   The MRI said it was from my back not my hip.    I live by myself.  I have to do for myself.    I have swelling the whole left leg and foot.   No bruising.   I can walk a short distance with my cane.    I made an appt with Dr. Jonny RuizJohn for 04/03/18 at 11:00.      Answer Assessment - Initial Assessment Questions 1. SYMPTOMS: "Do you have any symptoms?"     Started on Micardis 8 days ago.   I was taken off of Lisonpril and Maxide.   My BP is 183/95 and 171/83 as the lowest readings since on the Micardis. 2. SEVERITY: If symptoms are present, ask "Are they mild, moderate or severe?"     My BP is still high and I'm having constipation.  Protocols used: MEDICATION  QUESTION CALL-A-AH

## 2018-04-02 NOTE — Telephone Encounter (Signed)
noted 

## 2018-04-03 ENCOUNTER — Ambulatory Visit (HOSPITAL_COMMUNITY)
Admission: RE | Admit: 2018-04-03 | Discharge: 2018-04-03 | Disposition: A | Discharge status: Home / Self Care | Payer: Medicare Other | Source: Ambulatory Visit | Attending: Cardiology | Admitting: Cardiology

## 2018-04-03 ENCOUNTER — Encounter: Payer: Self-pay | Admitting: Internal Medicine

## 2018-04-03 ENCOUNTER — Ambulatory Visit (INDEPENDENT_AMBULATORY_CARE_PROVIDER_SITE_OTHER): Payer: Medicare Other | Admitting: Internal Medicine

## 2018-04-03 VITALS — BP 118/76 | HR 87 | Temp 98.1°F | Ht 64.0 in | Wt 129.0 lb

## 2018-04-03 DIAGNOSIS — M5416 Radiculopathy, lumbar region: Secondary | ICD-10-CM | POA: Diagnosis not present

## 2018-04-03 DIAGNOSIS — S93492A Sprain of other ligament of left ankle, initial encounter: Secondary | ICD-10-CM | POA: Diagnosis not present

## 2018-04-03 DIAGNOSIS — M25562 Pain in left knee: Secondary | ICD-10-CM | POA: Diagnosis not present

## 2018-04-03 DIAGNOSIS — S7002XA Contusion of left hip, initial encounter: Secondary | ICD-10-CM | POA: Diagnosis not present

## 2018-04-03 DIAGNOSIS — M7989 Other specified soft tissue disorders: Secondary | ICD-10-CM | POA: Insufficient documentation

## 2018-04-03 MED ORDER — TRAMADOL HCL 50 MG PO TABS: 50.0000 mg | ORAL_TABLET | Freq: Three times a day (TID) | ORAL | 0 refills | Status: DC | PRN

## 2018-04-03 NOTE — Patient Instructions (Signed)
Please take all new medication as prescribed - the tramadol as needed  You will be contacted regarding the referral for: stat Left leg vein test to check for blood clot  You will be contacted regarding the referral for: [facility] and to check on [facility] referral  Please continue all other medications as before, and refills have been done if requested.  Please have the pharmacy call with any other refills you may need.  Please keep your appointments with your specialists as you may have planned

## 2018-04-03 NOTE — Assessment & Plan Note (Signed)
Persistent, cont pain control, has been referred to NS and [facility] regarding scheduling for this as well

## 2018-04-03 NOTE — Progress Notes (Signed)
Subjective:    Patient ID: Erika Gibson, female    DOB: February 09, 1928, 84 y.o.   MRN: [id number]  HPI    Here to f/u, unfortunately with persistent LLE radiculopathy and LLE weakness; had a misstep 4/13 with a definite twisting of the knee, heard a pop, and knees seem lax and unstable to an unusual degree since then.  Pain and sweling not imrpoving, and now LLE below the knee pain and swelling as well.  Pt denies chest pain, increased sob or doe, wheezing, orthopnea, PND, increased LE swelling, palpitations, dizziness or syncope.  Pt denies new neurological symptoms such as new headache, or facial weakness or other local neuro except for above.  Pain about 8/10,  BP has been elevated multiple times at home, the highest 196/61 yesterday.  No other falls, fever, other trauma or injury.  Denies urinary symptoms such as dysuria, frequency, urgency, flank pain, hematuria or n/v, fever, chills.  But ":I am falling apart"   Past Medical History:  Diagnosis Date  . Anxiety state, unspecified   . ASTHMA   . Colitis, collagenous 2010  . COLONIC POLYPS, HX OF 06/2002   adenomatous polyp  . DEPRESSION   . HYPERLIPIDEMIA   . HYPERTENSION   . Nephrolithiasis   . Osteopenia   . Substance abuse [facility])    Past Surgical History:  Procedure Laterality Date  . ANKLE SURGERY     removed pins from ankle bone  . BREAST SURGERY     Implants  . DILATION AND CURETTAGE OF UTERUS    . TONSILLECTOMY      reports that she has quit smoking. She has never used smokeless tobacco. She reports that she does not drink alcohol or use drugs. family history includes Alzheimer's disease in her father, mother, and other; Breast cancer in her paternal aunt; Colon polyps in her brother; Heart disease in her father. Allergies  Allergen Reactions  . Mesalamine     REACTION: fuzzy headed and unsteady   Current Outpatient Medications on File Prior to Visit  Medication Sig Dispense Refill  . aspirin EC 81 MG tablet Take 81 mg by  mouth daily.    Marland Kitchen atorvastatin (LIPITOR) 10 MG tablet Take 1 tablet (10 mg total) by mouth daily. 90 tablet 3  . Cholecalciferol (VITAMIN D PO) Take by mouth.    . DOXYLAMINE SUCCINATE PO Take 25 mg by mouth at bedtime.    . Multiple Vitamin (MULTIVITAMIN) tablet Take 1 tablet by mouth daily.      Marland Kitchen telmisartan (MICARDIS) 40 MG tablet Take 1 tablet (40 mg total) by mouth daily. 90 tablet 3   No current facility-administered medications on file prior to visit.    Review of Systems  Constitutional: Negative for other unusual diaphoresis or sweats HENT: Negative for ear discharge or swelling Eyes: Negative for other worsening visual disturbances Respiratory: Negative for stridor or other swelling  Gastrointestinal: Negative for worsening distension or other blood Genitourinary: Negative for retention or other urinary change Musculoskeletal: Negative for other MSK pain or swelling Skin: Negative for color change or other new lesions Neurological: Negative for worsening tremors and other numbness  Psychiatric/Behavioral: Negative for worsening agitation or other fatigue All other system neg per pt    Objective:   Physical Exam BP 118/76   Pulse 87   Temp 98.1 F (36.7 C) (Oral)   Ht 5\' 4"  (1.626 m)   Wt 129 lb (58.5 kg)   SpO2 96%   BMI 22.14  kg/m  VS noted,  Constitutional: Pt appears in NAD HENT: Head: NCAT.  Right Ear: External ear normal.  Left Ear: External ear normal.  Eyes: . Pupils are equal, round, and reactive to light. Conjunctivae and EOM are normal Nose: without d/c or deformity Neck: Neck supple. Gross normal ROM Cardiovascular: Normal rate and regular rhythm.   Pulmonary/Chest: Effort normal and breath sounds without rales or wheezing.  Abd:  Soft, NT, ND, + BS, no organomegaly Neurological: Pt is alert. At baseline orientation, motor grossly intact except for persistnet 4/5 LLE weakness Left knee with 2+ effusion and mild lax on testing, with diffuse pain and  tendernss LLE below knee without cords but 1-2+ swelling and tender prox calf area Skin: Skin is warm. No rashes, other new lesions, no LE edema Psychiatric: Pt behavior is normal without agitation  No other exam findings     Assessment & Plan:

## 2018-04-03 NOTE — Assessment & Plan Note (Addendum)
Most likely related to knee injury, but cannot DVT - for LLE venous doppler  Note:  Total time for pt hx, exam, review of record with pt in the room, determination of diagnoses and plan for further eval and tx is > 40 min, with over 50% spent in coordination and counseling of patient including the differential dx, tx, further evaluation and other management of left knee pain, left leg swelling and LLE radiculopathy

## 2018-04-03 NOTE — Assessment & Plan Note (Signed)
Suspect ligamentous injury, for ortho referral for later today, pain control,  to f/u any worsening symptoms or concerns

## 2018-04-12 DIAGNOSIS — I10 Essential (primary) hypertension: Secondary | ICD-10-CM | POA: Diagnosis not present

## 2018-04-12 DIAGNOSIS — G8929 Other chronic pain: Secondary | ICD-10-CM | POA: Diagnosis not present

## 2018-04-12 DIAGNOSIS — M4316 Spondylolisthesis, lumbar region: Secondary | ICD-10-CM | POA: Diagnosis not present

## 2018-04-12 DIAGNOSIS — M545 Low back pain: Secondary | ICD-10-CM | POA: Diagnosis not present

## 2018-04-13 DIAGNOSIS — H26493 Other secondary cataract, bilateral: Secondary | ICD-10-CM | POA: Diagnosis not present

## 2018-04-13 DIAGNOSIS — Z961 Presence of intraocular lens: Secondary | ICD-10-CM | POA: Diagnosis not present

## 2018-04-13 DIAGNOSIS — H43813 Vitreous degeneration, bilateral: Secondary | ICD-10-CM | POA: Diagnosis not present

## 2018-04-13 DIAGNOSIS — H52203 Unspecified astigmatism, bilateral: Secondary | ICD-10-CM | POA: Diagnosis not present

## 2018-04-18 ENCOUNTER — Ambulatory Visit (INDEPENDENT_AMBULATORY_CARE_PROVIDER_SITE_OTHER): Payer: Medicare Other | Admitting: Internal Medicine

## 2018-04-18 VITALS — BP 134/86 | HR 83 | Temp 98.2°F | Ht 64.0 in | Wt 126.0 lb

## 2018-04-18 DIAGNOSIS — I10 Essential (primary) hypertension: Secondary | ICD-10-CM | POA: Diagnosis not present

## 2018-04-18 DIAGNOSIS — M25562 Pain in left knee: Secondary | ICD-10-CM

## 2018-04-18 DIAGNOSIS — G8929 Other chronic pain: Secondary | ICD-10-CM | POA: Diagnosis not present

## 2018-04-18 DIAGNOSIS — M7989 Other specified soft tissue disorders: Secondary | ICD-10-CM

## 2018-04-18 DIAGNOSIS — M5416 Radiculopathy, lumbar region: Secondary | ICD-10-CM | POA: Diagnosis not present

## 2018-04-18 NOTE — Assessment & Plan Note (Signed)
D/w pt, ok for f/u with sports medicine for cortisone

## 2018-04-18 NOTE — Assessment & Plan Note (Signed)
Likely sympathetic swelling related to knee, doubt baker cyst rupture, has hx of prior recent LE venous doppler neg, for compression stocking but she declines due to how hard it is to place on the leg

## 2018-04-18 NOTE — Progress Notes (Signed)
Subjective:    Patient ID: Erika Gibson, female    DOB: 26-Oct-1928, 84 y.o.   MRN: [id number]  HPI  Here with persistent left knee pain and swelling with worsening lower leg/ankle/foot swelling, has seen Dr Farris Has but declined cortisone, advised for compression stockings and f/u prn.  Pt is frustrated she seems to be getting worse. Also, Pt continues to have recurring left LBP without change in severity but has persistent pain radiating to the distal LLE without worsening numb or weakness, and no bowel or bladder change, fever, wt loss, gait change or falls.  Pt denies chest pain, increased sob or doe, wheezing, orthopnea, PND, increased LE swelling, palpitations, dizziness or syncope.   Past Medical History:  Diagnosis Date  . Anxiety state, unspecified   . ASTHMA   . Colitis, collagenous 2010  . COLONIC POLYPS, HX OF 06/2002   adenomatous polyp  . DEPRESSION   . HYPERLIPIDEMIA   . HYPERTENSION   . Nephrolithiasis   . Osteopenia   . Substance abuse [facility])    Past Surgical History:  Procedure Laterality Date  . ANKLE SURGERY     removed pins from ankle bone  . BREAST SURGERY     Implants  . DILATION AND CURETTAGE OF UTERUS    . TONSILLECTOMY      reports that she has quit smoking. She has never used smokeless tobacco. She reports that she does not drink alcohol or use drugs. family history includes Alzheimer's disease in her father, mother, and other; Breast cancer in her paternal aunt; Colon polyps in her brother; Heart disease in her father. Allergies  Allergen Reactions  . Mesalamine     REACTION: fuzzy headed and unsteady   Current Outpatient Medications on File Prior to Visit  Medication Sig Dispense Refill  . aspirin EC 81 MG tablet Take 81 mg by mouth daily.    Marland Kitchen atorvastatin (LIPITOR) 10 MG tablet Take 1 tablet (10 mg total) by mouth daily. 90 tablet 3  . Cholecalciferol (VITAMIN D PO) Take by mouth.    . DOXYLAMINE SUCCINATE PO Take 25 mg by mouth at bedtime.    .  Multiple Vitamin (MULTIVITAMIN) tablet Take 1 tablet by mouth daily.      Marland Kitchen telmisartan (MICARDIS) 40 MG tablet Take 1 tablet (40 mg total) by mouth daily. 90 tablet 3  . traMADol (ULTRAM) 50 MG tablet Take 1 tablet (50 mg total) by mouth every 8 (eight) hours as needed. 30 tablet 0   No current facility-administered medications on file prior to visit.    Review of Systems  Constitutional: Negative for other unusual diaphoresis or sweats HENT: Negative for ear discharge or swelling Eyes: Negative for other worsening visual disturbances Respiratory: Negative for stridor or other swelling  Gastrointestinal: Negative for worsening distension or other blood Genitourinary: Negative for retention or other urinary change Musculoskeletal: Negative for other MSK pain or swelling Skin: Negative for color change or other new lesions Neurological: Negative for worsening tremors and other numbness  Psychiatric/Behavioral: Negative for worsening agitation or other fatigue All other system neg per pt    Objective:   Physical Exam BP 134/86   Pulse 83   Temp 98.2 F (36.8 C) (Oral)   Ht  (1.626 m)   Wt 126 lb (57.2 kg)   SpO2 96%   BMI 21.63 kg/m  VS noted, non toxic Constitutional: Pt appears in NAD HENT: Head: NCAT.  Right Ear: External ear normal.  Left Ear: External  ear normal.  Eyes: . Pupils are equal, round, and reactive to light. Conjunctivae and EOM are normal Nose: without d/c or deformity Neck: Neck supple. Gross normal ROM Cardiovascular: Normal rate and regular rhythm.   Pulmonary/Chest: Effort normal and breath sounds without rales or wheezing.  Spine nontender in the midline Left knee with 2+ effusion, also distal leg is 2-3+ swelling without tender or erythema Neurological: Pt is alert. At baseline orientation, motor grossly intact Skin: Skin is warm. No rashes, other new lesions, no LE edema Psychiatric: Pt behavior is normal without agitation           Assessment & Plan:

## 2018-04-18 NOTE — Patient Instructions (Signed)
Please continue all other medications as before, and refills have been done if requested.  Please have the pharmacy call with any other refills you may need.  Please continue your efforts at being more active, low cholesterol diet, and weight control.  Please keep your appointments with your specialists as you may have planned  Please make an appt with Sports medicine as you leave today

## 2018-04-18 NOTE — Assessment & Plan Note (Signed)
stable overall by history and exam, recent data reviewed with pt, and pt to continue medical treatment as before,  to f/u any worsening symptoms or concerns  

## 2018-04-18 NOTE — Assessment & Plan Note (Signed)
Non surgical per pt per NS due to age, has f/u appt for [facility] on May 16

## 2018-04-26 DIAGNOSIS — M47816 Spondylosis without myelopathy or radiculopathy, lumbar region: Secondary | ICD-10-CM | POA: Diagnosis not present

## 2018-04-26 DIAGNOSIS — M48062 Spinal stenosis, lumbar region with neurogenic claudication: Secondary | ICD-10-CM | POA: Diagnosis not present

## 2018-04-26 DIAGNOSIS — I10 Essential (primary) hypertension: Secondary | ICD-10-CM | POA: Diagnosis not present

## 2018-04-30 ENCOUNTER — Ambulatory Visit: Payer: Self-pay | Admitting: Family Medicine

## 2018-05-08 DIAGNOSIS — I10 Essential (primary) hypertension: Secondary | ICD-10-CM | POA: Diagnosis not present

## 2018-05-08 DIAGNOSIS — M47816 Spondylosis without myelopathy or radiculopathy, lumbar region: Secondary | ICD-10-CM | POA: Diagnosis not present

## 2018-05-15 ENCOUNTER — Telehealth: Payer: Self-pay | Admitting: Internal Medicine

## 2018-05-15 ENCOUNTER — Other Ambulatory Visit: Payer: Self-pay | Admitting: Internal Medicine

## 2018-05-15 ENCOUNTER — Encounter: Payer: Self-pay | Admitting: Internal Medicine

## 2018-05-15 ENCOUNTER — Ambulatory Visit (INDEPENDENT_AMBULATORY_CARE_PROVIDER_SITE_OTHER): Payer: Medicare Other | Admitting: Internal Medicine

## 2018-05-15 VITALS — BP 124/82 | HR 78 | Temp 97.6°F | Ht 64.0 in | Wt 128.0 lb

## 2018-05-15 DIAGNOSIS — I10 Essential (primary) hypertension: Secondary | ICD-10-CM | POA: Diagnosis not present

## 2018-05-15 DIAGNOSIS — N189 Chronic kidney disease, unspecified: Secondary | ICD-10-CM

## 2018-05-15 DIAGNOSIS — G4700 Insomnia, unspecified: Secondary | ICD-10-CM

## 2018-05-15 MED ORDER — HYDROCHLOROTHIAZIDE 25 MG PO TABS: 12.5000 mg | ORAL_TABLET | Freq: Every day | ORAL | 3 refills | Status: DC

## 2018-05-15 MED ORDER — TRAZODONE HCL 50 MG PO TABS: 25.0000 mg | ORAL_TABLET | Freq: Every evening | ORAL | 1 refills | Status: DC | PRN

## 2018-05-15 MED ORDER — TELMISARTAN 40 MG PO TABS: 40.0000 mg | ORAL_TABLET | Freq: Every day | ORAL | 3 refills | Status: DC

## 2018-05-15 MED ORDER — TELMISARTAN-HCTZ 40-12.5 MG PO TABS: 1.0000 | ORAL_TABLET | Freq: Every day | ORAL | 3 refills | Status: DC

## 2018-05-15 NOTE — Patient Instructions (Addendum)
Ok to change the micardis to micardis HCT when you are ready  Please take all new medication as prescribed - the trazodone for sleep  Please continue all other medications as before, and refills have been done if requested.  Please have the pharmacy call with any other refills you may need.  Please keep your appointments with your specialists as you may have planned

## 2018-05-15 NOTE — Progress Notes (Signed)
Subjective:    Patient ID: Erika Gibson, female    DOB: 05/18/1928, 84 y.o.   MRN: [id number]  HPI  Here to f/u; overall doing ok,  Pt denies chest pain, increasing sob or doe, wheezing, orthopnea, PND, palpitations, dizziness or syncope, but has had worsening LE swelling after stopping maxide in light of incontinence that unfortunately has not improved, and BP at home has increased to SBP 140-160s, not sure why seems better in office today.  Pt denies new neurological symptoms such as new headache, or facial or extremity weakness or numbness.  Pt denies polydipsia, polyuria   Pt states overall good compliance with meds, mostly trying to follow appropriate diet, with wt overall stable, Due for left knee cortisone but already pain better and she may not do it  Has not started PT.  Also, Son wants her to move to his small town she is not sure of, has less health care options.  Does also c/o worsening insomnia in last few months, has not really required sleep aid in past, but has tried all the otc that dont seem to work.  Denies worsening depressive symptoms, suicidal ideation, or panic Past Medical History:  Diagnosis Date  . Anxiety state, unspecified   . ASTHMA   . Colitis, collagenous 2010  . COLONIC POLYPS, HX OF 06/2002   adenomatous polyp  . DEPRESSION   . HYPERLIPIDEMIA   . HYPERTENSION   . Nephrolithiasis   . Osteopenia   . Substance abuse [facility])    Past Surgical History:  Procedure Laterality Date  . ANKLE SURGERY     removed pins from ankle bone  . BREAST SURGERY     Implants  . DILATION AND CURETTAGE OF UTERUS    . TONSILLECTOMY      reports that she has quit smoking. She has never used smokeless tobacco. She reports that she does not drink alcohol or use drugs. family history includes Alzheimer's disease in her father, mother, and other; Breast cancer in her paternal aunt; Colon polyps in her brother; Heart disease in her father. Allergies  Allergen Reactions  . Mesalamine      REACTION: fuzzy headed and unsteady   Current Outpatient Medications on File Prior to Visit  Medication Sig Dispense Refill  . aspirin EC 81 MG tablet Take 81 mg by mouth daily.    Marland Kitchen atorvastatin (LIPITOR) 10 MG tablet Take 1 tablet (10 mg total) by mouth daily. 90 tablet 3  . Cholecalciferol (VITAMIN D PO) Take by mouth.    . DOXYLAMINE SUCCINATE PO Take 25 mg by mouth at bedtime.    . Multiple Vitamin (MULTIVITAMIN) tablet Take 1 tablet by mouth daily.      . traMADol (ULTRAM) 50 MG tablet Take 1 tablet (50 mg total) by mouth every 8 (eight) hours as needed. 30 tablet 0   No current facility-administered medications on file prior to visit.    Review of Systems  Constitutional: Negative for other unusual diaphoresis or sweats HENT: Negative for ear discharge or swelling Eyes: Negative for other worsening visual disturbances Respiratory: Negative for stridor or other swelling  Gastrointestinal: Negative for worsening distension or other blood Genitourinary: Negative for retention or other urinary change Musculoskeletal: Negative for other MSK pain or swelling Skin: Negative for color change or other new lesions Neurological: Negative for worsening tremors and other numbness  Psychiatric/Behavioral: Negative for worsening agitation or other fatigue All other system neg per pt    Objective:   Physical  Exam BP 124/82   Pulse 78   Temp 97.6 F (36.4 C) (Oral)   Ht 5\' 4"  (1.626 m)   Wt 128 lb (58.1 kg)   SpO2 98%   BMI 21.97 kg/m  VS noted,  Constitutional: Pt appears in NAD HENT: Head: NCAT.  Right Ear: External ear normal.  Left Ear: External ear normal.  Eyes: . Pupils are equal, round, and reactive to light. Conjunctivae and EOM are normal Nose: without d/c or deformity Neck: Neck supple. Gross normal ROM Cardiovascular: Normal rate and regular rhythm.   Pulmonary/Chest: Effort normal and breath sounds without rales or wheezing.  Abd:  Soft, NT, ND, + BS, no  organomegaly Neurological: Pt is alert. At baseline orientation, motor grossly intact Skin: Skin is warm. No rashes, other new lesions, 1-2+ bilat LE edema to knees left > right Psychiatric: Pt behavior is normal without agitation  No other exam findings  Lab Results  Component Value Date   WBC 7.5 03/21/2018   HGB 13.9 03/21/2018   HCT 41.0 03/21/2018   PLT 265.0 03/21/2018   GLUCOSE 93 03/21/2018   CHOL 161 03/21/2018   TRIG 111.0 03/21/2018   HDL 62.50 03/21/2018   LDLCALC 76 03/21/2018   ALT 20 03/21/2018   AST 24 03/21/2018   NA 140 03/21/2018   K 4.0 03/21/2018   CL 102 03/21/2018   CREATININE 1.17 03/21/2018   BUN 27 (H) 03/21/2018   CO2 31 03/21/2018   TSH 2.44 03/21/2018   HGBA1C 5.8 03/21/2018        Assessment & Plan:

## 2018-05-15 NOTE — Assessment & Plan Note (Signed)
Mild worsening and with worsening LE edema, for change micardis to micardis hct 40-12.5 prn, cont to monitor BP at home,  to f/u any worsening symptoms or concerns

## 2018-05-15 NOTE — Telephone Encounter (Signed)
Ok for sihrron to let pt know that her insurance will not pay for micardis hct,  So we need to make it into 2 prescriptoins:  micardis 40 qd (same she has already been taking recently)   And HCT 25 mg - 1/2 per day

## 2018-05-15 NOTE — Assessment & Plan Note (Signed)
stable overall by history and exam, recent data reviewed with pt, and pt to continue medical treatment as before,  to f/u any worsening symptoms or concerns Lab Results  Component Value Date   CREATININE 1.17 03/21/2018

## 2018-05-15 NOTE — Assessment & Plan Note (Signed)
Ok for trazodone qhs prn,  to f/u any worsening symptoms or concerns  

## 2018-05-16 NOTE — Telephone Encounter (Signed)
Pt has been informed and expressed understanding.  

## 2018-05-18 ENCOUNTER — Other Ambulatory Visit (INDEPENDENT_AMBULATORY_CARE_PROVIDER_SITE_OTHER): Payer: Medicare Other

## 2018-05-18 ENCOUNTER — Encounter: Payer: Self-pay | Admitting: Internal Medicine

## 2018-05-18 ENCOUNTER — Ambulatory Visit (INDEPENDENT_AMBULATORY_CARE_PROVIDER_SITE_OTHER): Payer: Medicare Other | Admitting: Internal Medicine

## 2018-05-18 VITALS — BP 180/100 | HR 107 | Ht 64.0 in | Wt 129.0 lb

## 2018-05-18 DIAGNOSIS — I10 Essential (primary) hypertension: Secondary | ICD-10-CM | POA: Diagnosis not present

## 2018-05-18 DIAGNOSIS — R159 Full incontinence of feces: Secondary | ICD-10-CM | POA: Diagnosis not present

## 2018-05-18 DIAGNOSIS — R32 Unspecified urinary incontinence: Secondary | ICD-10-CM

## 2018-05-18 DIAGNOSIS — G4700 Insomnia, unspecified: Secondary | ICD-10-CM | POA: Diagnosis not present

## 2018-05-18 DIAGNOSIS — F411 Generalized anxiety disorder: Secondary | ICD-10-CM

## 2018-05-18 LAB — URINALYSIS, ROUTINE W REFLEX MICROSCOPIC
Bilirubin Urine: NEGATIVE
Hgb urine dipstick: NEGATIVE
Leukocytes, UA: NEGATIVE
Nitrite: NEGATIVE
PH: 5.5 (ref 5.0–8.0)
Total Protein, Urine: NEGATIVE
UROBILINOGEN UA: 0.2 (ref 0.0–1.0)
Urine Glucose: NEGATIVE

## 2018-05-18 MED ORDER — TEMAZEPAM 15 MG PO CAPS: ORAL_CAPSULE | ORAL | 2 refills | Status: DC

## 2018-05-18 NOTE — Patient Instructions (Signed)
OK to try Immodium otc as needed  OK to stop the trazodone that you were taking for sleep  Please take all new medication as prescribed - the temazepam for sleep  Please continue all other medications as before, and refills have been done if requested.  Please have the pharmacy call with any other refills you may need.  Please keep your appointments with your specialists as you may have planned  You will be contacted regarding the referral for: Urology  Please go to the LAB in the Basement (turn left off the elevator) for the tests to be done today - just the urine testing today if you able  You will be contacted by phone if any changes need to be made immediately.  Otherwise, you will receive a letter about your results with an explanation, but please check with MyChart first.  Please remember to sign up for MyChart if you have not done so, as this will be important to you in the future with finding out test results, communicating by private email, and scheduling acute appointments online when needed.

## 2018-05-18 NOTE — Progress Notes (Signed)
Subjective:    Patient ID: Erika Gibson, female    DOB: 08-15-1928, 84 y.o.   MRN: [id number]  HPI  Here to f/u urinary and bowel incontinence recent worsening, quite nervous, upset, frustrated and just overall miserable and repeats herself with pressured speech multiple times  Has had incontinence of bowel and bladder to a mild degree for over a year, but now worse with rather free flowing per pt most days in the past 2 wks, intermittent, mod, not affected by any dietary change, Denies urinary symptoms such as dysuria, frequency, urgency, flank pain, hematuria or n/v, fever, chills, and Denies worsening reflux, abd pain, dysphagia, n/v, constipation or blood.  Has not tried any otc meds and no other recent med changes.  Also c/o now persistent insomnia for 2 wks, Denies worsening depressive symptoms, suicidal ideation, or panic; has ongoing anxiety. Past Medical History:  Diagnosis Date  . Anxiety state, unspecified   . ASTHMA   . Colitis, collagenous 2010  . COLONIC POLYPS, HX OF 06/2002   adenomatous polyp  . DEPRESSION   . HYPERLIPIDEMIA   . HYPERTENSION   . Nephrolithiasis   . Osteopenia   . Substance abuse [facility])    Past Surgical History:  Procedure Laterality Date  . ANKLE SURGERY     removed pins from ankle bone  . BREAST SURGERY     Implants  . DILATION AND CURETTAGE OF UTERUS    . TONSILLECTOMY      reports that she has quit smoking. She has never used smokeless tobacco. She reports that she does not drink alcohol or use drugs. family history includes Alzheimer's disease in her father, mother, and other; Breast cancer in her paternal aunt; Colon polyps in her brother; Heart disease in her father. Allergies  Allergen Reactions  . Mesalamine     REACTION: fuzzy headed and unsteady   Current Outpatient Medications on File Prior to Visit  Medication Sig Dispense Refill  . aspirin EC 81 MG tablet Take 81 mg by mouth daily.    Marland Kitchen atorvastatin (LIPITOR) 10 MG tablet Take 1  tablet (10 mg total) by mouth daily. 90 tablet 3  . Cholecalciferol (VITAMIN D PO) Take by mouth.    . DOXYLAMINE SUCCINATE PO Take 25 mg by mouth at bedtime.    . hydrochlorothiazide (HYDRODIURIL) 25 MG tablet Take 0.5 tablets (12.5 mg total) by mouth daily. 45 tablet 3  . Multiple Vitamin (MULTIVITAMIN) tablet Take 1 tablet by mouth daily.      Marland Kitchen telmisartan (MICARDIS) 40 MG tablet Take 1 tablet (40 mg total) by mouth daily. 90 tablet 3  . traMADol (ULTRAM) 50 MG tablet Take 1 tablet (50 mg total) by mouth every 8 (eight) hours as needed. 30 tablet 0  . traZODone (DESYREL) 50 MG tablet Take 0.5-1 tablets (25-50 mg total) by mouth at bedtime as needed for sleep. (Patient not taking: Reported on 05/18/2018) 90 tablet 1   No current facility-administered medications on file prior to visit.    Review of Systems  Constitutional: Negative for other unusual diaphoresis or sweats HENT: Negative for ear discharge or swelling Eyes: Negative for other worsening visual disturbances Respiratory: Negative for stridor or other swelling  Gastrointestinal: Negative for worsening distension or other blood Genitourinary: Negative for retention or other urinary change Musculoskeletal: Negative for other MSK pain or swelling Skin: Negative for color change or other new lesions Neurological: Negative for worsening tremors and other numbness  Psychiatric/Behavioral: Negative for worsening agitation or  other fatigue All other system neg per pt    Objective:   Physical Exam BP (!) 180/100 (BP Location: Left Arm, Patient Position: Sitting, Cuff Size: Normal)   Pulse (!) 107   Ht 5\' 4"  (1.626 m)   Wt 129 lb (58.5 kg)   SpO2 98%   BMI 22.14 kg/m  VS noted,  Constitutional: Pt appears in NAD HENT: Head: NCAT.  Right Ear: External ear normal.  Left Ear: External ear normal.  Eyes: . Pupils are equal, round, and reactive to light. Conjunctivae and EOM are normal Nose: without d/c or deformity Neck: Neck  supple. Gross normal ROM Cardiovascular: Normal rate and regular rhythm.   Pulmonary/Chest: Effort normal and breath sounds without rales or wheezing.  Abd:  Soft, NT, ND, + BS, no organomegaly Neurological: Pt is alert. At baseline orientation, motor grossly intact Skin: Skin is warm. No rashes, other new lesions Psychiatric: Pt behavior is normal without agitation but 2-3+ nervous 1+ edema LE left > right No other exam findings      Assessment & Plan:

## 2018-05-19 LAB — URINE CULTURE
MICRO NUMBER:: 90686988
SPECIMEN QUALITY:: ADEQUATE

## 2018-05-20 ENCOUNTER — Encounter: Payer: Self-pay | Admitting: Internal Medicine

## 2018-05-20 DIAGNOSIS — R32 Unspecified urinary incontinence: Principal | ICD-10-CM

## 2018-05-20 DIAGNOSIS — R159 Full incontinence of feces: Secondary | ICD-10-CM | POA: Insufficient documentation

## 2018-05-20 NOTE — Assessment & Plan Note (Addendum)
Etiology unclear, recent worsening and pt quite beside herself today, exam benign, for urine studies and Urology referral, also ok for immodium otc prn,  to f/u any worsening symptoms or concerns  Note:  Total time for pt hx, exam, review of record with pt in the room, determination of diagnoses and plan for further eval and tx is > 40 min, with over 50% spent in coordination and counseling of patient including the differential dx, tx, further evaluation and other management of bowel and bladder incontinence, anxiety, insomnia, and uncontrolled HTN

## 2018-05-20 NOTE — Assessment & Plan Note (Signed)
Rather marked today, likely situational, declines counseling referral or other change in med tx

## 2018-05-20 NOTE — Assessment & Plan Note (Signed)
Ok to d/c trazodone as not helping, for temazepam qhs prn

## 2018-05-20 NOTE — Assessment & Plan Note (Signed)
BP Readings from Last 3 Encounters:  05/18/18 (!) 180/100  05/15/18 124/82  04/18/18 134/86  severe today likely reactive, to cont home med tx and f/u BP at home and next visit

## 2018-05-22 ENCOUNTER — Telehealth: Payer: Self-pay | Admitting: Internal Medicine

## 2018-05-22 NOTE — Telephone Encounter (Signed)
Copied from CRM [id number]. Topic: Quick Communication - Rx Refill/Question >> May 22, 2018  1:29 PM Erika BergeronBarksdale, Harvey B wrote: Medication: temazepam (RESTORIL) 15 MG capsule [[id number]][[id number]]   Pt called and states the medication above is very strong for her, pt is wondering if she can have a lesser dose of the medication, call pt to advise

## 2018-05-23 ENCOUNTER — Ambulatory Visit: Payer: Self-pay | Admitting: Family Medicine

## 2018-05-23 MED ORDER — TEMAZEPAM 7.5 MG PO CAPS: 7.5000 mg | ORAL_CAPSULE | Freq: Every evening | ORAL | 2 refills | Status: DC | PRN

## 2018-05-23 NOTE — Telephone Encounter (Signed)
Pt.notified

## 2018-05-23 NOTE — Telephone Encounter (Signed)
Ok for decreased temazepam to 7.5 qhs prn - done erx

## 2018-05-23 NOTE — Telephone Encounter (Signed)
Called pt, LVM.   

## 2018-05-23 NOTE — Telephone Encounter (Signed)
Patient is requesting a lesser dosing or different medication- too strong. Patient feels she is too out of it- and it takes her half a day to recover. She would like to try a different dose. She does not report adverse SE- just too strong.

## 2018-05-23 NOTE — Addendum Note (Signed)
Addended by: Corwin LevinsJOHN, Robbi Scurlock W on: 05/23/2018 03:44 PM   Modules accepted: Orders

## 2018-05-28 ENCOUNTER — Telehealth: Payer: Self-pay | Admitting: Cardiology

## 2018-05-28 NOTE — Progress Notes (Signed)
Cardiology Office Note   Date:  05/29/2018   ID:  Erika, Gibson September 21, 1928, MRN [id number]  PCP:  Corwin Levins, MD  Cardiologist:  Jens Som   Chief Complaint  Patient presents with  . Hypertension    C/O HIGH BP SINCE FALL IN [facility]  . Venous Insufficiency     History of Present Illness: Erika Gibson is a 84 y.o. female who presents for ongoing assessment and management of hypertension, history of grade 1 diastolic dysfunction, chronic lower extremity edema in the setting of significant lower extremity varicosities and venous insufficiency with skin changes noted.  The patient has a history of multiple falls usually associated with walking her dog, when he pulls against the leash.  On last office visit on 02/19/2018 the patient was recommended to have bilateral TED hose.    She is noted to have bilateral carotid bruits and a carotid ultrasound was ordered to evaluate.  The patient was found to have tortuous mid and distal ICA segments making imaging difficult.  She did not have any hemodynamically significant plaque however.    She comes today multiple complaints. The majority of complaints are musculoskeletal in etiology. She has had injection in her lumbar spine due to nerve impingement as well as degenerative disease and bulging disc. She has arthritis of her knees. Chronic LEE but refuses TED hose as they are too hard to put on.   BP is elevated today. She is unhappy with her medication regimen. She is also not sleeping well unless she takes tramadol due to her pain.  She is moving to be closer to her son who lives in another state. They are traveling now to help her to relocate.   Past Medical History:  Diagnosis Date  . Anxiety state, unspecified   . ASTHMA   . Colitis, collagenous 2010  . COLONIC POLYPS, HX OF 06/2002   adenomatous polyp  . DEPRESSION   . HYPERLIPIDEMIA   . HYPERTENSION   . Nephrolithiasis   . Osteopenia   . Substance abuse [facility])     Past Surgical  History:  Procedure Laterality Date  . ANKLE SURGERY     removed pins from ankle bone  . BREAST SURGERY     Implants  . DILATION AND CURETTAGE OF UTERUS    . TONSILLECTOMY       Current Outpatient Medications  Medication Sig Dispense Refill  . aspirin EC 81 MG tablet Take 81 mg by mouth daily.    Marland Kitchen atorvastatin (LIPITOR) 10 MG tablet Take 1 tablet (10 mg total) by mouth daily. 90 tablet 3  . Cholecalciferol (VITAMIN D PO) Take by mouth.    . DOXYLAMINE SUCCINATE PO Take 25 mg by mouth at bedtime.    . hydrochlorothiazide (HYDRODIURIL) 25 MG tablet Take 1 tablet (25 mg total) by mouth daily. 90 tablet 1  . Multiple Vitamin (MULTIVITAMIN) tablet Take 1 tablet by mouth daily.      Marland Kitchen telmisartan (MICARDIS) 40 MG tablet Take 1.5 tablets (60 mg total) by mouth daily. 135 tablet 1  . temazepam (RESTORIL) 7.5 MG capsule Take 1 capsule (7.5 mg total) by mouth at bedtime as needed for sleep. 30 capsule 2  . traMADol (ULTRAM) 50 MG tablet Take 1 tablet (50 mg total) by mouth every 8 (eight) hours as needed. FUTURE REFILLS WITH PCP 30 tablet 0  . traZODone (DESYREL) 50 MG tablet Take 0.5-1 tablets (25-50 mg total) by mouth at bedtime as needed for sleep. (Patient  not taking: Reported on 05/29/2018) 90 tablet 1   No current facility-administered medications for this visit.     Allergies:   Mesalamine    Social History:  The patient  reports that she has quit smoking. She has never used smokeless tobacco. She reports that she does not drink alcohol or use drugs.   Family History:  The patient's family history includes Alzheimer's disease in her father, mother, and other; Breast cancer in her paternal aunt; Colon polyps in her brother; Heart disease in her father.    ROS: All other systems are reviewed and negative. Unless otherwise mentioned in H&P    PHYSICAL EXAM: VS:  BP (!) 176/82 (BP Location: Left Arm)   Pulse 89   Ht 5\' 3"  (1.6 m)   Wt 127 lb 3.2 oz (57.7 kg)   SpO2 96%   BMI  22.53 kg/m  , BMI Body mass index is 22.53 kg/m. GEN: Well nourished, well developed, in no acute distress  HEENT: normal  Neck: no JVD, carotid bruits, or masses Cardiac: RRR; no murmurs, rubs, or gallops,no edema  Respiratory:  clear to auscultation bilaterally, normal work of breathing GI: soft, nontender, nondistended, + BS MS: no deformity or atrophy dependent edema is noted with some ecchymosis to the medial lateral thigh proximal to the knee.  Skin: warm and dry, no rash Neuro:  Strength and sensation are intact Psych: euthymic mood, full affect.Anxious    EKG: NSR rate of  83 bpm.   Recent Labs: 03/21/2018: ALT 20; BUN 27; Creatinine, Ser 1.17; Hemoglobin 13.9; Platelets 265.0; Potassium 4.0; Sodium 140; TSH 2.44    Lipid Panel    Component Value Date/Time   CHOL 161 03/21/2018 1104   TRIG 111.0 03/21/2018 1104   HDL 62.50 03/21/2018 1104   CHOLHDL 3 03/21/2018 1104   VLDL 22.2 03/21/2018 1104   LDLCALC 76 03/21/2018 1104      Wt Readings from Last 3 Encounters:  05/29/18 127 lb 3.2 oz (57.7 kg)  05/18/18 129 lb (58.5 kg)  05/15/18 128 lb (58.1 kg)      Other studies Reviewed: Echocardiogram 04-12-2018 Left ventricle: The cavity size was normal. Wall thickness was   normal. Systolic function was normal. The estimated ejection   fraction was in the range of 50% to 55%. Wall motion was normal;   there were no regional wall motion abnormalities. Doppler   parameters are consistent with abnormal left ventricular   relaxation (grade 1 diastolic dysfunction). - Aortic valve: There was trivial regurgitation. - Mitral valve: There was mild regurgitation. - Atrial septum: There was redundancy of the septum, with   borderline criteria for aneurysm. - Pulmonary arteries: PA peak pressure: 31 mm Hg (S).  ASSESSMENT AND PLAN:  1.  Hypertension: I have rechecked her BP in the clinic today. She remains elevated. I will increase Micardis to 60 mg from 40 mg, and increase  HCTZ to 25 mg from 12.5mg . She is to continue to keep BP log and have follow up for BP check in two weeks. She will have BMET prior to that office visit.   2. Chronic Pain: She is to follow up with Dr. Jonny Ruiz for pain management. I have given her one refill of tramadol but cardiology will not be managing this.   3.  Chronic Venous insufficiency: She refused TED hose. She hopefully increase in HCTZ will help with some of the LEE which is chronic for her.   4. Lumbar spine disease: To be followed  by Dr. Laurence ComptonBarton for ongoing management and need for further injections.   Current medicines are reviewed at length with the patient today.    Labs/ tests ordered today include: BMET Bettey MareKathryn M. Liborio NixonLawrence DNP, ANP, AACC   05/29/2018 10:39 AM    [facility] [address], [place], [place] [address] Phone: [phone]; Fax: [phone]

## 2018-05-28 NOTE — Telephone Encounter (Signed)
New Message:       Pt c/o BP issue: STAT if pt c/o blurred vision, one-sided weakness or slurred speech  1. What are your last 5 BP readings? 117/87 193/91  2. Are you having any other symptoms (ex. Dizziness, headache, blurred vision, passed out)?   3. What is your BP issue? Pt states some of her medications has changed due to her primary dr and now she is experiencing some bp issues. I have scheduled to pt for an appt tomorrow with PA

## 2018-05-28 NOTE — Telephone Encounter (Signed)
Spoke to patient . She states she did see primary last week - only discussed  Sleep ing medication , nothing about medication cahgnes from April of this year . Patient did not give  Medication name.  Patient will discuss issue at  tomorrows appointmet

## 2018-05-29 ENCOUNTER — Ambulatory Visit (INDEPENDENT_AMBULATORY_CARE_PROVIDER_SITE_OTHER): Payer: Medicare Other | Admitting: Adult Health

## 2018-05-29 ENCOUNTER — Encounter: Payer: Self-pay | Admitting: Adult Health

## 2018-05-29 VITALS — BP 176/82 | HR 89 | Ht 63.0 in | Wt 127.2 lb

## 2018-05-29 DIAGNOSIS — Z79899 Other long term (current) drug therapy: Secondary | ICD-10-CM

## 2018-05-29 DIAGNOSIS — I872 Venous insufficiency (chronic) (peripheral): Secondary | ICD-10-CM | POA: Diagnosis not present

## 2018-05-29 DIAGNOSIS — I10 Essential (primary) hypertension: Secondary | ICD-10-CM

## 2018-05-29 MED ORDER — TRAMADOL HCL 50 MG PO TABS: 50.0000 mg | ORAL_TABLET | Freq: Three times a day (TID) | ORAL | 0 refills | Status: DC | PRN

## 2018-05-29 MED ORDER — TELMISARTAN 40 MG PO TABS: 60.0000 mg | ORAL_TABLET | Freq: Every day | ORAL | 1 refills | Status: DC

## 2018-05-29 MED ORDER — HYDROCHLOROTHIAZIDE 25 MG PO TABS: 25.0000 mg | ORAL_TABLET | Freq: Every day | ORAL | 1 refills | Status: DC

## 2018-05-29 NOTE — Patient Instructions (Signed)
Medication Instructions:  INCREASE MICARDIS 60MG  DAILY 1-1/2 TAB) DAILY  INCREASE HCTZ 25MG  DAILY (1TAB) DAILY  If you need a refill on your cardiac medications before your next appointment, please call your pharmacy.  Labwork: BMET IN ONE WEEK 6-24-MONDAY HERE IN OUR OFFICE AT [facility]  Take the provided lab slips with you to the lab for your blood draw.   You will NOT need to fast   Special Instructions: WE REFILLED TRAMADOL-FUTURE REFILLS WITH DR Jonny RuizJOHN  MAKE SURE TO SCHEDULE FOLLOW UP WITH DR Jonny RuizJOHN  CONTINUE TO LOG BP AND BRING TO FOLLOW UP APPT IN 1-2 WEEKS WITH PHARMACIST.  Follow-Up: Your physician wants you to follow-up: WITH PHARMACIST FOR BP CHECK   [facility] you [facility] choosing [facility]!!

## 2018-05-31 ENCOUNTER — Ambulatory Visit (INDEPENDENT_AMBULATORY_CARE_PROVIDER_SITE_OTHER): Payer: Medicare Other | Admitting: Internal Medicine

## 2018-05-31 ENCOUNTER — Encounter: Payer: Self-pay | Admitting: Internal Medicine

## 2018-05-31 VITALS — BP 126/84 | HR 87 | Temp 98.3°F | Ht 63.0 in | Wt 128.0 lb

## 2018-05-31 DIAGNOSIS — M545 Low back pain, unspecified: Secondary | ICD-10-CM | POA: Insufficient documentation

## 2018-05-31 DIAGNOSIS — G4700 Insomnia, unspecified: Secondary | ICD-10-CM

## 2018-05-31 DIAGNOSIS — G8929 Other chronic pain: Secondary | ICD-10-CM | POA: Diagnosis not present

## 2018-05-31 DIAGNOSIS — I10 Essential (primary) hypertension: Secondary | ICD-10-CM | POA: Diagnosis not present

## 2018-05-31 DIAGNOSIS — I872 Venous insufficiency (chronic) (peripheral): Secondary | ICD-10-CM | POA: Diagnosis not present

## 2018-05-31 MED ORDER — LORAZEPAM 0.5 MG PO TABS: ORAL_TABLET | ORAL | 2 refills | Status: DC

## 2018-05-31 NOTE — Assessment & Plan Note (Signed)
stable overall by history and exam, recent data reviewed with pt, and pt to continue medical treatment as before,  to f/u any worsening symptoms or concerns  

## 2018-05-31 NOTE — Progress Notes (Signed)
Subjective:    Patient ID: Erika Gibson, female    DOB: [birth year], 84 y.o.   MRN: [id number]  HPI  Here just 2 days after last seen per cardiology PA with anxiety and recent med changes, has not yet obtained the compression stockings.  Pt denies chest pain, increased sob or doe, wheezing, orthopnea, PND, increased LE swelling, palpitations, dizziness or syncope.  Has had some "depression" worse in the am with her current sleep med even at half dose - the temazepam.  Has tried melatonin  Has old [place] and not ready to try again.  She is ok for ativan qhs prn trial.  Pt continues to have recurring LBP without change in severity, bowel or bladder change, fever, wt loss,  worsening LE pain/numbness/weakness, gait change or falls, but has ongoing LE general weakness she is concerned.    Past Medical History:  Diagnosis Date  . Anxiety state, unspecified   . ASTHMA   . Colitis, collagenous 2010  . COLONIC POLYPS, HX OF 06/2002   adenomatous polyp  . DEPRESSION   . HYPERLIPIDEMIA   . HYPERTENSION   . Nephrolithiasis   . Osteopenia   . Substance abuse [facility](H[facility])    Past Surgical History:  Procedure Laterality Date  . ANKLE SURGERY     removed pins from ankle bone  . BREAST SURGERY     Implants  . DILATION AND CURETTAGE OF UTERUS    . TONSILLECTOMY      reports that she has quit smoking. She has never used smokeless tobacco. She reports that she does not drink alcohol or use drugs. family history includes Alzheimer's disease in her father, mother, and other; Breast cancer in her paternal aunt; Colon polyps in her brother; Heart disease in her father. Allergies  Allergen Reactions  . Mesalamine     REACTION: fuzzy headed and unsteady   Current Outpatient Medications on File Prior to Visit  Medication Sig Dispense Refill  . aspirin EC 81 MG tablet Take 81 mg by mouth daily.    Marland Kitchen. atorvastatin (LIPITOR) 10 MG tablet Take 1 tablet (10 mg total) by mouth daily. 90 tablet 3  . Cholecalciferol  (VITAMIN D PO) Take by mouth.    . DOXYLAMINE SUCCINATE PO Take 25 mg by mouth at bedtime.    . hydrochlorothiazide (HYDRODIURIL) 25 MG tablet Take 1 tablet (25 mg total) by mouth daily. 90 tablet 1  . Multiple Vitamin (MULTIVITAMIN) tablet Take 1 tablet by mouth daily.      Marland Kitchen. telmisartan (MICARDIS) 40 MG tablet Take 1.5 tablets (60 mg total) by mouth daily. 135 tablet 1  . temazepam (RESTORIL) 7.5 MG capsule Take 1 capsule (7.5 mg total) by mouth at bedtime as needed for sleep. 30 capsule 2  . traMADol (ULTRAM) 50 MG tablet Take 1 tablet (50 mg total) by mouth every 8 (eight) hours as needed. FUTURE REFILLS WITH PCP 30 tablet 0   No current facility-administered medications on file prior to visit.    Review of Systems  Constitutional: Negative for other unusual diaphoresis or sweats HENT: Negative for ear discharge or swelling Eyes: Negative for other worsening visual disturbances Respiratory: Negative for stridor or other swelling  Gastrointestinal: Negative for worsening distension or other blood Genitourinary: Negative for retention or other urinary change Musculoskeletal: Negative for other MSK pain or swelling Skin: Negative for color change or other new lesions Neurological: Negative for worsening tremors and other numbness  Psychiatric/Behavioral: Negative for worsening agitation or other fatigue  All other system neg per pt    Objective:   Physical Exam BP 126/84   Pulse 87   Temp 98.3 F (36.8 C) (Oral)   Ht 5\' 3"  (1.6 m)   Wt 128 lb (58.1 kg)   SpO2 94%   BMI 22.67 kg/m  VS noted,  Constitutional: Pt appears in NAD HENT: Head: NCAT.  Right Ear: External ear normal.  Left Ear: External ear normal.  Eyes: . Pupils are equal, round, and reactive to light. Conjunctivae and EOM are normal Nose: without d/c or deformity Neck: Neck supple. Gross normal ROM Cardiovascular: Normal rate and regular rhythm.   Pulmonary/Chest: Effort normal and breath sounds without rales or  wheezing.  Abd:  Soft, NT, ND, + BS, no organomegaly Neurological: Pt is alert. At baseline orientation, motor grossly intact Skin: Skin is warm. No rashes, other new lesions, has trace bilat LE edema Psychiatric: Pt behavior is normal without agitation , 2+ nervous No other exam findings Lab Results  Component Value Date   WBC 7.5 03/21/2018   HGB 13.9 03/21/2018   HCT 41.0 03/21/2018   PLT 265.0 03/21/2018   GLUCOSE 93 03/21/2018   CHOL 161 03/21/2018   TRIG 111.0 03/21/2018   HDL 62.50 03/21/2018   LDLCALC 76 03/21/2018   ALT 20 03/21/2018   AST 24 03/21/2018   NA 140 03/21/2018   K 4.0 03/21/2018   CL 102 03/21/2018   CREATININE 1.17 03/21/2018   BUN 27 (H) 03/21/2018   CO2 31 03/21/2018   TSH 2.44 03/21/2018   HGBA1C 5.8 03/21/2018       Assessment & Plan:

## 2018-05-31 NOTE — Assessment & Plan Note (Signed)
Ok for trial ativan qhs prn,  to f/u any worsening symptoms or concerns

## 2018-05-31 NOTE — Assessment & Plan Note (Signed)
Encouraged for compression stockings

## 2018-05-31 NOTE — Patient Instructions (Addendum)
Ok to stop the temazepam and trazodone you have had to take for sleep  Please take all new medication as prescribed - the low dose ativan as needed for sleep  Please continue all other medications as before, and refills have been done if requested.  Please have the pharmacy call with any other refills you may need.  Please keep your appointments with your specialists as you may have planned  You will be contacted regarding the referral for: Physical Therapy  Please return in 3 weeks for repeat BP check

## 2018-05-31 NOTE — Assessment & Plan Note (Signed)
Chronic persistent, encouraged to f/u with [facility] per Dr Farris HasKramer, also for PT eval, cont tramadol prn

## 2018-06-07 ENCOUNTER — Ambulatory Visit (INDEPENDENT_AMBULATORY_CARE_PROVIDER_SITE_OTHER): Payer: Medicare Other | Admitting: Pharmacist

## 2018-06-07 VITALS — BP 158/66 | HR 84

## 2018-06-07 DIAGNOSIS — I10 Essential (primary) hypertension: Secondary | ICD-10-CM | POA: Diagnosis not present

## 2018-06-07 DIAGNOSIS — Z79899 Other long term (current) drug therapy: Secondary | ICD-10-CM | POA: Diagnosis not present

## 2018-06-07 NOTE — Progress Notes (Addendum)
Patient ID: Erika Gibson                 DOB: 1928-06-14                      MRN: [id number]     HPI: Erika Gibson is a 84 y.o. female patient of DR Jens Som referred by Joni Reining NP to HTN clinic. PMH includes hypertension, osteopenia, CKD, dyslipidema, and depression. During most recent OV with Erika Gibson on 05/29/2018, her HCTZ dose was increased to 25mg  daily and telmisartan dose was increased from 40mg  to 60mg  daily. Patient presents today and reports unilateral lower extremity edema, depression, and back pain. Denies recent fall, dizziness, headaches or chest pain.  Current HTN meds:  HCTZ 25mg  daily telmisartan 60mg  daily  Intolerance: none  BP goal: <130/80  Family History: The patient's family history includes Alzheimer's disease in her father, mother, and other; Breast cancer in her paternal aunt; Colon polyps in her brother; Heart disease in her father.   Social History: The patient  reports that she has quit smoking. She has never used smokeless tobacco. She reports that she does not drink alcohol or use drugs.  Diet: seldom eats out; mostly home cooked meals  Exercise: decreased frequency walking  Home BP readings: 15 readings; average 156/84; HR 60-99bpm *patient never rest for few minutes prior to BP checks - believed is better to know how high it get during daily stress*  Wt Readings from Last 3 Encounters:  05/31/18 128 lb (58.1 kg)  05/29/18 127 lb 3.2 oz (57.7 kg)  05/18/18 129 lb (58.5 kg)   BP Readings from Last 3 Encounters:  06/07/18 (!) 158/66  05/31/18 126/84  05/29/18 (!) 176/82   Pulse Readings from Last 3 Encounters:  06/07/18 84  05/31/18 87  05/29/18 89    Past Medical History:  Diagnosis Date  . Anxiety state, unspecified   . ASTHMA   . Colitis, collagenous 2010  . COLONIC POLYPS, HX OF 06/2002   adenomatous polyp  . DEPRESSION   . HYPERLIPIDEMIA   . HYPERTENSION   . Nephrolithiasis   . Osteopenia   . Substance abuse (HCC)      Current Outpatient Medications on File Prior to Visit  Medication Sig Dispense Refill  . aspirin EC 81 MG tablet Take 81 mg by mouth daily.    Marland Kitchen atorvastatin (LIPITOR) 10 MG tablet Take 1 tablet (10 mg total) by mouth daily. 90 tablet 3  . Cholecalciferol (VITAMIN D PO) Take by mouth.    . DOXYLAMINE SUCCINATE PO Take 25 mg by mouth at bedtime.    . hydrochlorothiazide (HYDRODIURIL) 25 MG tablet Take 1 tablet (25 mg total) by mouth daily. 90 tablet 1  . LORazepam (ATIVAN) 0.5 MG tablet 1/2 - 1 tab by mouth at bedtime as needed for sleep 30 tablet 2  . Multiple Vitamin (MULTIVITAMIN) tablet Take 1 tablet by mouth daily.      Marland Kitchen telmisartan (MICARDIS) 40 MG tablet Take 1.5 tablets (60 mg total) by mouth daily. 135 tablet 1  . temazepam (RESTORIL) 7.5 MG capsule Take 1 capsule (7.5 mg total) by mouth at bedtime as needed for sleep. 30 capsule 2  . traMADol (ULTRAM) 50 MG tablet Take 1 tablet (50 mg total) by mouth every 8 (eight) hours as needed. FUTURE REFILLS WITH PCP 30 tablet 0   No current facility-administered medications on file prior to visit.     Allergies  Allergen  Reactions  . Mesalamine     REACTION: fuzzy headed and unsteady    Blood pressure (!) 158/66, pulse 84, SpO2 95 %.  Essential hypertension Blood pressure remains elevated above desired goal of 130/80, but is significantly improved from previous readying of 176/82. We spent long time discussing need to rest quietly for 3-5 minutes prior to monitor BP at home, influence of pain in BP readying, and need to increase physical activity (as allowed by orthopedic doctor).  Standing BP did not reflect orthostatic issues, therefore we can be more aggressive on her hypertension management strategy. Will repeat BMET today to determine if renal function remains stable after increasing diuretic and ARB dose. Plan to change HCTZ to chlorthalidone if renal function stable and follow up in 3 weeks. May increase telmisartan to 80mg  if  additional BP control needed afterwards.   Eryc Bodey Rodriguez-Guzman PharmD, BCPS, CPP [facility] [address] [place] [phone] 06/08/2018 9:25 AM    06/09/18: BMET stable. Renal function remains unchanged. Chlorthalidone Rx sent to prefer pharmacy. Patient aware of results and new prescription. Plan to repeat BMEt in 3 weeks during HTN follow up. Tierany Appleby Rodriguez-Guzman PharmD, BCPS, CPP [facility] [address] [place] [phone] 06/08/2018 11:49 AM

## 2018-06-07 NOTE — Patient Instructions (Addendum)
Return for a follow up appointment in 2-3 weeks  Check your blood pressure at home daily (if able) and keep record of the readings.  Take your BP meds as follows: *Plan to change HCTZ 25mg  to chlorthalidone 25mg  - if blood work remains stable*  Bring all of your meds, your BP cuff and your record of home blood pressures to your next appointment.  Exercise as you're able, try to walk approximately 30 minutes per day.  Keep salt intake to a minimum, especially watch canned and prepared boxed foods.  Eat more fresh fruits and vegetables and fewer canned items.  Avoid eating in fast food restaurants.    HOW TO TAKE YOUR BLOOD PRESSURE: . Rest 5 minutes before taking your blood pressure. .  Don't smoke or drink caffeinated beverages for at least 30 minutes before. . Take your blood pressure before (not after) you eat. . Sit comfortably with your back supported and both feet on the floor (don't cross your legs). . Elevate your arm to heart level on a table or a desk. . Use the proper sized cuff. It should fit smoothly and snugly around your bare upper arm. There should be enough room to slip a fingertip under the cuff. The bottom edge of the cuff should be 1 inch above the crease of the elbow. . Ideally, take 3 measurements at one sitting and record the average.

## 2018-06-08 ENCOUNTER — Encounter: Payer: Self-pay | Admitting: Pharmacist

## 2018-06-08 ENCOUNTER — Telehealth: Payer: Self-pay | Admitting: Pharmacist

## 2018-06-08 LAB — BASIC METABOLIC PANEL
BUN/Creatinine Ratio: 22 (ref 12–28)
BUN: 26 mg/dL (ref 8–27)
CO2: 28 mmol/L (ref 20–29)
CREATININE: 1.17 mg/dL — AB (ref 0.57–1.00)
Calcium: 10.2 mg/dL (ref 8.7–10.3)
Chloride: 102 mmol/L (ref 96–106)
GFR calc Af Amer: 48 mL/min/{1.73_m2} — ABNORMAL LOW (ref >59)
GFR calc non Af Amer: 42 mL/min/{1.73_m2} — ABNORMAL LOW (ref >59)
Glucose: 104 mg/dL — ABNORMAL HIGH (ref 65–99)
Potassium: 3.7 mmol/L (ref 3.5–5.2)
Sodium: 143 mmol/L (ref 134–144)

## 2018-06-08 MED ORDER — CHLORTHALIDONE 25 MG PO TABS: 25.0000 mg | ORAL_TABLET | Freq: Every day | ORAL | 1 refills | Status: DC

## 2018-06-08 MED ORDER — TELMISARTAN 40 MG PO TABS: 60.0000 mg | ORAL_TABLET | Freq: Every day | ORAL | 1 refills | Status: DC

## 2018-06-08 NOTE — Telephone Encounter (Signed)
BMET stable (uncahnged). Will replace HCTZ with chlorthalidone 25mg . Plan to repeat BMEt in 2 week during f/u HTN visit.

## 2018-06-08 NOTE — Assessment & Plan Note (Signed)
Blood pressure remains elevated above desired goal of 130/80, but is significantly improved from previous readying of 176/82. We spent long time discussing need to rest quietly for 3-5 minutes prior to monitor BP at home, influence of pain in BP readying, and need to increase physical activity (as allowed by orthopedic doctor).  Standing BP did not reflect orthostatic issues, therefore we can be more aggressive on her hypertension management strategy. Will repeat BMET today to determine if renal function remains stable after increasing diuretic and ARB dose. Plan to change HCTZ to chlorthalidone if renal function stable and follow up in 3 weeks. May increase telmisartan to 80mg  if additional BP control needed afterwards.

## 2018-06-11 DIAGNOSIS — M48062 Spinal stenosis, lumbar region with neurogenic claudication: Secondary | ICD-10-CM | POA: Diagnosis not present

## 2018-06-11 DIAGNOSIS — I10 Essential (primary) hypertension: Secondary | ICD-10-CM | POA: Diagnosis not present

## 2018-06-11 DIAGNOSIS — M47816 Spondylosis without myelopathy or radiculopathy, lumbar region: Secondary | ICD-10-CM | POA: Diagnosis not present

## 2018-06-13 ENCOUNTER — Ambulatory Visit (INDEPENDENT_AMBULATORY_CARE_PROVIDER_SITE_OTHER): Payer: Medicare Other | Admitting: Podiatry

## 2018-06-13 ENCOUNTER — Encounter: Payer: Self-pay | Admitting: Podiatry

## 2018-06-13 DIAGNOSIS — M79674 Pain in right toe(s): Secondary | ICD-10-CM

## 2018-06-13 DIAGNOSIS — M79675 Pain in left toe(s): Secondary | ICD-10-CM | POA: Diagnosis not present

## 2018-06-13 DIAGNOSIS — B351 Tinea unguium: Secondary | ICD-10-CM | POA: Diagnosis not present

## 2018-06-13 NOTE — Progress Notes (Signed)
Patient ID: [name], female   DOB: November 10, 1928, 84 y.o.   MRN: [id number] Patient ID: [name], female   DOB: November 10, 1928, 84 y.o.   MRN: [id number] Complaint:  Visit Type: Patient returns to my office for continued preventative foot care services. Complaint: Patient states" my nails have grown long and thick and become painful to walk and wear shoes" . The patient presents for preventative foot care services. No changes to ROS  Podiatric Exam: Vascular: dorsalis pedis and posterior tibial pulses are palpable bilateral. Capillary return is immediate. Temperature gradient is WNL. Skin turgor WNL . Varicosities noted feet B/L Sensorium: Normal Semmes Weinstein monofilament test. Normal tactile sensation bilaterally. Nail Exam: Pt has thick disfigured discolored nails with subungual debris noted bilateral entire nail hallux through fifth toenails Ulcer Exam: There is no evidence of ulcer or pre-ulcerative changes or infection. Orthopedic Exam: Muscle tone and strength are WNL. No limitations in general ROM. No crepitus or effusions noted. Hammer toes 2-5  B/L Skin:  Porokeratosis sub 3 right, asymptomatic No infection or ulcers  Diagnosis:  Onychomycosis, , Pain in right toe, pain in left toes.    Treatment & Plan Procedures and Treatment: Consent by patient was obtained for treatment procedures. The patient understood the discussion of treatment and procedures well. All questions were answered thoroughly reviewed. Debridement of mycotic and hypertrophic toenails, 1 through 5 bilateral and clearing of subungual debris. No ulceration, no infection noted. .  ABN signed for 2019   Return Visit-Office Procedure: Patient instructed to return to the office for a follow up visit 3 months for continued evaluation and treatment.    Helane GuntherGregory Khaleelah Yowell DPM

## 2018-06-21 ENCOUNTER — Ambulatory Visit: Payer: Self-pay

## 2018-06-21 ENCOUNTER — Ambulatory Visit (INDEPENDENT_AMBULATORY_CARE_PROVIDER_SITE_OTHER): Payer: Medicare Other | Admitting: Internal Medicine

## 2018-06-21 ENCOUNTER — Encounter: Payer: Self-pay | Admitting: Internal Medicine

## 2018-06-21 DIAGNOSIS — I10 Essential (primary) hypertension: Secondary | ICD-10-CM

## 2018-06-21 DIAGNOSIS — F329 Major depressive disorder, single episode, unspecified: Secondary | ICD-10-CM | POA: Diagnosis not present

## 2018-06-21 DIAGNOSIS — R609 Edema, unspecified: Secondary | ICD-10-CM

## 2018-06-21 DIAGNOSIS — N189 Chronic kidney disease, unspecified: Secondary | ICD-10-CM | POA: Diagnosis not present

## 2018-06-21 DIAGNOSIS — F32A Depression, unspecified: Secondary | ICD-10-CM

## 2018-06-21 DIAGNOSIS — R600 Localized edema: Secondary | ICD-10-CM | POA: Insufficient documentation

## 2018-06-21 MED ORDER — CITALOPRAM HYDROBROMIDE 10 MG PO TABS: 10.0000 mg | ORAL_TABLET | Freq: Every day | ORAL | 3 refills | Status: DC

## 2018-06-21 MED ORDER — TELMISARTAN 80 MG PO TABS: 80.0000 mg | ORAL_TABLET | Freq: Every day | ORAL | 3 refills | Status: AC

## 2018-06-21 NOTE — Progress Notes (Addendum)
Subjective:    Patient ID: Erika Gibson, female    DOB: 05/20/1928, 84 y.o.   MRN: [id number]  HPI  Here to f/u leg swelling and elevated BP; was seen recently per HTN clinic with change HCT to chlorthalidone and increased micardis to 60 mg daily; pt BP at home daily has been 170's systolic.  Swelling has ben some worse to legs below the knees as well, especially later in the day, some better in the AM.  Overall seems worse to wear the knee high stockings as they dig in too much below the knees.  Plans to f/u with Dr Katrinka BlazingSmith and HTN clinic.  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   Past Medical History:  Diagnosis Date  . Anxiety state, unspecified   . ASTHMA   . Colitis, collagenous 2010  . COLONIC POLYPS, HX OF 06/2002   adenomatous polyp  . DEPRESSION   . HYPERLIPIDEMIA   . HYPERTENSION   . Nephrolithiasis   . Osteopenia   . Substance abuse [facility])    Past Surgical History:  Procedure Laterality Date  . ANKLE SURGERY     removed pins from ankle bone  . BREAST SURGERY     Implants  . DILATION AND CURETTAGE OF UTERUS    . TONSILLECTOMY      reports that she has quit smoking. She has never used smokeless tobacco. She reports that she does not drink alcohol or use drugs. family history includes Alzheimer's disease in her father, mother, and other; Breast cancer in her paternal aunt; Colon polyps in her brother; Heart disease in her father. Allergies  Allergen Reactions  . Mesalamine     REACTION: fuzzy headed and unsteady   Current Outpatient Medications on File Prior to Visit  Medication Sig Dispense Refill  . aspirin EC 81 MG tablet Take 81 mg by mouth daily.    Marland Kitchen. atorvastatin (LIPITOR) 10 MG tablet Take 1 tablet (10  mg total) by mouth daily. 90 tablet 3  . chlorthalidone (HYGROTON) 25 MG tablet Take 1 tablet (25 mg total) by mouth daily. To replace hydrochlorothiazide 25mg  30 tablet 1  . Cholecalciferol (VITAMIN D PO) Take by mouth.    . DOXYLAMINE SUCCINATE PO Take 25 mg by mouth at bedtime.    Marland Kitchen. LORazepam (ATIVAN) 0.5 MG tablet 1/2 - 1 tab by mouth at bedtime as needed for sleep 30 tablet 2  . Multiple Vitamin (MULTIVITAMIN) tablet Take 1 tablet by mouth daily.      . temazepam (RESTORIL) 7.5 MG capsule Take 1 capsule (7.5 mg total) by mouth at bedtime as needed for sleep. 30 capsule 2  . traMADol (ULTRAM) 50 MG tablet Take 1 tablet (50 mg total) by mouth every 8 (eight) hours as needed. FUTURE REFILLS WITH PCP 30 tablet 0   No current facility-administered medications on file prior to visit.    Review of Systems  Constitutional: Negative for other unusual diaphoresis or sweats HENT: Negative for ear discharge or swelling Eyes: Negative for other worsening visual disturbances Respiratory: Negative for stridor or other swelling  Gastrointestinal: Negative for worsening distension or other blood Genitourinary: Negative for retention or other urinary change Musculoskeletal: Negative for other MSK pain or  swelling Skin: Negative for color change or other new lesions Neurological: Negative for worsening tremors and other numbness  Psychiatric/Behavioral: Negative for worsening agitation or other fatigue All other system neg per pt    Objective:   Physical Exam BP (!) 144/70 (BP Location: Left Arm, Patient Position: Sitting, Cuff Size: Normal)   Pulse 90   Ht 5\' 3"  (1.6 m)   Wt 126 lb (57.2 kg)   SpO2 95%   BMI 22.32 kg/m  VS noted, not ill appearing but depressed affect, + psychomotor retardation Constitutional: Pt appears in NAD HENT: Head: NCAT.  Right Ear: External ear normal.  Left Ear: External ear normal.  Eyes: . Pupils are equal, round, and reactive to light. Conjunctivae and EOM are  normal Nose: without d/c or deformity Neck: Neck supple. Gross normal ROM Cardiovascular: Normal rate and regular rhythm.   Pulmonary/Chest: Effort normal and breath sounds without rales or wheezing.  Abd:  Soft, NT, ND, + BS, no organomegaly Neurological: Pt is alert. At baseline orientation, motor grossly intact Skin: Skin is warm. No rashes, other new lesions, trace to 1+ bilat LE edema Psychiatric: Pt behavior is normal without agitation but marked depressed affect No other exam findings Lab Results  Component Value Date   WBC 7.5 03/21/2018   HGB 13.9 03/21/2018   HCT 41.0 03/21/2018   PLT 265.0 03/21/2018   GLUCOSE 104 (H) 06/07/2018   CHOL 161 03/21/2018   TRIG 111.0 03/21/2018   HDL 62.50 03/21/2018   LDLCALC 76 03/21/2018   ALT 20 03/21/2018   AST 24 03/21/2018   NA 143 06/07/2018   K 3.7 06/07/2018   CL 102 06/07/2018   CREATININE 1.17 (H) 06/07/2018   BUN 26 06/07/2018   CO2 28 06/07/2018   TSH 2.44 03/21/2018   HGBA1C 5.8 03/21/2018       Assessment & Plan:

## 2018-06-21 NOTE — Patient Instructions (Addendum)
Ok to increase the micardis (telmisartan) to 80 mg per day  Please take all new medication as prescribed - the celexa 10 mg per day  Please continue all other medications as before, and refills have been done if requested.  Please have the pharmacy call with any other refills you may need.  Please keep your appointments with your specialists as you may have planned - the Hypertension Clinic on July 19

## 2018-06-21 NOTE — Assessment & Plan Note (Signed)
C/w venous insufficiency, to restart compression stockings if able to tolerate

## 2018-06-21 NOTE — Assessment & Plan Note (Signed)
Mod uncontrolled, to increase the micardis 80 qd, cont all other meds, f/u HTN clinic as planned with f/u lab

## 2018-06-21 NOTE — Assessment & Plan Note (Signed)
Mild to mod worsening, no SI, for celexa 10 qd,  to f/u any worsening symptoms or concerns, declines counseling referral

## 2018-06-21 NOTE — Assessment & Plan Note (Signed)
stable overall by history and exam, recent data reviewed with pt, and pt to continue medical treatment as before,  to f/u any worsening symptoms or concerns  

## 2018-06-21 NOTE — Telephone Encounter (Signed)
Pt. Reports she has had swelling to her feet and legs since starting on her Micardis. Reports she is still having elevated BP readings. Today 170/84. Using compression socks, "but they are not helping." "I'm really upset with all of this." Denies any chest pain or shortness of breath. Appointment made for today with her provider.  Reason for Disposition . [1] MODERATE leg swelling (e.g., swelling extends up to knees) AND [2] new onset or worsening  Answer Assessment - Initial Assessment Questions 1. ONSET: "When did the swelling start?" (e.g., minutes, hours, days)     After starting my Micardis -  2. LOCATION: "What part of the leg is swollen?"  "Are both legs swollen or just one leg?"     Both legs 3. SEVERITY: "How bad is the swelling?" (e.g., localized; mild, moderate, severe)  - Localized - small area of swelling localized to one leg  - MILD pedal edema - swelling limited to foot and ankle, pitting edema < 1/4 inch (6 mm) deep, rest and elevation eliminate most or all swelling  - MODERATE edema - swelling of lower leg to knee, pitting edema > 1/4 inch (6 mm) deep, rest and elevation only partially reduce swelling  - SEVERE edema - swelling extends above knee, facial or hand swelling present      Moderate 4. REDNESS: "Does the swelling look red or infected?"     No 5. PAIN: "Is the swelling painful to touch?" If so, ask: "How painful is it?"   (Scale 1-10; mild, moderate or severe)     5 6. FEVER: "Do you have a fever?" If so, ask: "What is it, how was it measured, and when did it start?"      No 7. CAUSE: "What do you think is causing the leg swelling?"     My medicine 8. MEDICAL HISTORY: "Do you have a history of heart failure, kidney disease, liver failure, or cancer?"     HTN 9. RECURRENT SYMPTOM: "Have you had leg swelling before?" If so, ask: "When was the last time?" "What happened that time?"     No 10. OTHER SYMPTOMS: "Do you have any other symptoms?" (e.g., chest pain,  difficulty breathing)       No 11. PREGNANCY: "Is there any chance you are pregnant?" "When was your last menstrual period?"       No  Protocols used: LEG SWELLING AND EDEMA-A-AH

## 2018-06-22 ENCOUNTER — Telehealth: Payer: Self-pay | Admitting: Internal Medicine

## 2018-06-22 ENCOUNTER — Ambulatory Visit: Payer: Medicare Other

## 2018-06-22 NOTE — Telephone Encounter (Signed)
Spoke with Erika Gibson regarding AWV. She stated that she will give office a call back when she is ready to schedule. SF

## 2018-06-25 NOTE — Progress Notes (Signed)
[facility] [address] [place], [place] [address] Phone: [phone] Subjective:    I'm seeing this patient by the request  of:    CC: Left knee pain  [id number]  Erika Gibson is a 84 y.o. Gibson coming in with complaint of left knee pain. States she does not have pain in her knee. Fell back in April. Says her knee cap is loose and throws her off balance. Has numbness and tingling bilaterally.   Onset- Chronic Location-mostly the anterior aspect of the knee Duration-seems to be chronic Character-aching sensation Aggravating factors- Walks with caution  Reliving factors-when sitting Therapies tried-only some over-the-counter medication Severity-5 out of 10 in severity     Past Medical History:  Diagnosis Date  . Anxiety state, unspecified   . ASTHMA   . Colitis, collagenous 2010  . COLONIC POLYPS, HX OF 06/2002   adenomatous polyp  . DEPRESSION   . HYPERLIPIDEMIA   . HYPERTENSION   . Nephrolithiasis   . Osteopenia   . Substance abuse [facility])    Past Surgical History:  Procedure Laterality Date  . ANKLE SURGERY     removed pins from ankle bone  . BREAST SURGERY     Implants  . DILATION AND CURETTAGE OF UTERUS    . TONSILLECTOMY     Social History   Socioeconomic History  . Marital status: Widowed    Spouse name: Not on file  . Number of children: 2  . Years of education: Not on file  . Highest education level: Not on file  Occupational History  . Not on file  Social Needs  . Financial resource strain: Not on file  . Food insecurity:    Worry: Not on file    Inability: Not on file  . Transportation needs:    Medical: Not on file    Non-medical: Not on file  Tobacco Use  . Smoking status: Former Games developer  . Smokeless tobacco: Never Used  . Tobacco comment: Stopped 45 years ago  Substance and Sexual Activity  . Alcohol use: No    Alcohol/week: 0.0 oz  . Drug use: No  . Sexual activity: Not on file  Lifestyle  .  Physical activity:    Days per week: Not on file    Minutes per session: Not on file  . Stress: Not on file  Relationships  . Social connections:    Talks on phone: Not on file    Gets together: Not on file    Attends religious service: Not on file    Active member of club or organization: Not on file    Attends meetings of clubs or organizations: Not on file    Relationship status: Not on file  Other Topics Concern  . Not on file  Social History Narrative   Widowed 07/2014 (spouse with dementia for years prior to death)   Lives w/ alcoholic son   Allergies  Allergen Reactions  . Mesalamine     REACTION: fuzzy headed and unsteady   Family History  Problem Relation Age of Onset  . Alzheimer's disease Mother   . Heart disease Father   . Alzheimer's disease Father   . Colon polyps Brother   . Breast cancer Paternal Aunt   . Alzheimer's disease Other      Past medical history, social, surgical and family history all reviewed in electronic medical record.  No pertanent information unless stated regarding to the chief complaint.   Review  of Systems:Review of systems updated and as accurate as of 06/26/18  No headache, visual changes, nausea, vomiting, diarrhea, constipation, dizziness, abdominal pain, skin rash, fevers, chills, night sweats, weight loss, swollen lymph nodes, body aches, chest pain, shortness of breath, mood changes.  Positive muscle aches and joint swelling  Objective  Blood pressure 118/72, pulse 78, height 5\' 3"  (1.6 m), weight 125 lb (56.7 kg), SpO2 98 %. Systems examined below as of 06/26/18   General: No apparent distress alert and oriented x3 mood and affect normal, dressed appropriately.  HEENT: Pupils equal, extraocular movements intact  Respiratory: Patient's speak in full sentences and does not appear short of breath  Cardiovascular: 2+lower extremity edema, tender, no erythema seems symmetric to the contralateral side Skin: Warm dry intact with no  signs of infection or rash on extremities or on axial skeleton.  Abdomen: Soft nontender  Neuro: Cranial nerves II through XII are intact, neurovascularly intact in all extremities with 2+ DTRs and 2+ pulses.  Lymph: No lymphadenopathy of posterior or anterior cervical chain or axillae bilaterally.  Gait antalgic walking with the aid of a cane.  MSK:  Non tender with full range of motion and good stability and symmetric strength and tone of shoulders, elbows, wrist, hip, and ankles bilaterally Knee: Left valgus deformity noted.  Abnormal thigh to calf ratio.  Tender to palpation over medial and PF joint line.  ROM full in flexion and extension and lower leg rotation. instability with valgus force.  painful patellar compression. Patellar glide with moderate crepitus. Patellar and quadriceps tendons unremarkable. Hamstring and quadriceps strength is normal. Contralateral knee shows moderate arthritic changes as well with some mild instability  [id number]; 15 additional minutes spent for Therapeutic exercises as stated in above notes.  This included exercises focusing on stretching, strengthening, with significant focus on eccentric aspects.   Long term goals include an improvement in range of motion, strength, endurance as well as avoiding reinjury. Patient's frequency would include in 1-2 times a day, 3-5 times a week for a duration of 6-12 weeks.  Given rehab exercises handout for VMO, hip abductors, core, entire kinetic chain including proprioception exercises including cone touches, step downs, hip elevations and turn outs.  Could benefit from PT, regular exercise, upright biking, and a PFS knee brace to assist with tracking abnormalities.   Proper technique shown and discussed handout in great detail with ATC.  All questions were discussed and answered.     Impression and Recommendations:     This case required medical decision making of moderate complexity.      Note: This dictation  was prepared with Dragon dictation along with smaller phrase technology. Any transcriptional errors that result from this process are unintentional.

## 2018-06-26 ENCOUNTER — Ambulatory Visit (INDEPENDENT_AMBULATORY_CARE_PROVIDER_SITE_OTHER): Payer: Medicare Other | Admitting: Family Medicine

## 2018-06-26 ENCOUNTER — Encounter: Payer: Self-pay | Admitting: Family Medicine

## 2018-06-26 ENCOUNTER — Ambulatory Visit (INDEPENDENT_AMBULATORY_CARE_PROVIDER_SITE_OTHER)
Admission: RE | Admit: 2018-06-26 | Discharge: 2018-06-26 | Disposition: A | Discharge status: Home / Self Care | Payer: Medicare Other | Source: Ambulatory Visit | Attending: Family Medicine | Admitting: Family Medicine

## 2018-06-26 VITALS — BP 118/72 | HR 78 | Ht 63.0 in | Wt 125.0 lb

## 2018-06-26 DIAGNOSIS — M25562 Pain in left knee: Secondary | ICD-10-CM

## 2018-06-26 DIAGNOSIS — M1712 Unilateral primary osteoarthritis, left knee: Secondary | ICD-10-CM

## 2018-06-26 DIAGNOSIS — G8929 Other chronic pain: Secondary | ICD-10-CM

## 2018-06-26 DIAGNOSIS — I872 Venous insufficiency (chronic) (peripheral): Secondary | ICD-10-CM

## 2018-06-26 DIAGNOSIS — M170 Bilateral primary osteoarthritis of knee: Secondary | ICD-10-CM | POA: Diagnosis not present

## 2018-06-26 NOTE — Assessment & Plan Note (Signed)
Venous insufficiency of the lower extremities with 2+ pitting edema.  Discussed icing regimen, compression, as well as elevation.  Discussed the possibility of a Doppler which patient declined.  Follow-up again in 4 weeks

## 2018-06-26 NOTE — Assessment & Plan Note (Signed)
Patellofemoral arthritis, discussed icing regimen and home exercises.  X-rays ordered today.  Discussed topical anti-inflammatories and given sample.  Patient is on a baby aspirin.  Does have some swelling in the lower extremity more than the contralateral side but patient declined a Doppler to rule out deep venous thrombosis.  Patient's distal pulses though are 2+ and brisk.  Patient declined formal physical therapy.  Follow-up again in 4 weeks

## 2018-06-26 NOTE — Patient Instructions (Signed)
Good to see you  Erika Gibson is your friend.  Ice 20 minutes 2 times daily. Usually after activity and before bed. Exercises 3 times a week.  pennsaid pinkie amount topically 2 times daily as needed.  Xray downstairs Wear the brace with a lot of walking For the swelling would try to elevate the foot above heart when you can.  Otherwise compression socks would be good.  See me again in 4 weeks if not doing better

## 2018-06-27 ENCOUNTER — Telehealth: Payer: Self-pay

## 2018-06-27 DIAGNOSIS — M48062 Spinal stenosis, lumbar region with neurogenic claudication: Secondary | ICD-10-CM | POA: Diagnosis not present

## 2018-06-27 DIAGNOSIS — I10 Essential (primary) hypertension: Secondary | ICD-10-CM | POA: Diagnosis not present

## 2018-06-27 NOTE — Telephone Encounter (Signed)
Left message for patient to call back. Was going to inform patient about [facility] return policy.

## 2018-06-27 NOTE — Telephone Encounter (Signed)
Copied from CRM [id number]. Topic: Inquiry >> Jun 27, 2018  7:39 AM Erika Gibson, Andra M wrote: Reason for CRM: Patient stating that the brace she received on yesterday does not fit. She cannot fit it over her pant leg. PAtient plans to bring the brace back today.       Thank You!!!

## 2018-06-28 NOTE — Telephone Encounter (Signed)
Called Erika Gibson and answered a few questions she had. She wanted to know how to return a knee brace. Gave her [facility] phone number and told her to return the brace to us on 06/29/2018. Also instructed her to use the pink slip to help [facility]. She also had a question about her knee injection she received from Dr. Katrinka BlazingSmith. She stated that she thought the injection would help with strengthening her knee and I explained that the injection does not help strengthen the knee.

## 2018-06-29 ENCOUNTER — Ambulatory Visit (INDEPENDENT_AMBULATORY_CARE_PROVIDER_SITE_OTHER): Payer: Medicare Other | Admitting: Pharmacist

## 2018-06-29 ENCOUNTER — Other Ambulatory Visit: Payer: Self-pay | Admitting: Internal Medicine

## 2018-06-29 VITALS — BP 172/78 | HR 72 | Wt 126.4 lb

## 2018-06-29 DIAGNOSIS — I10 Essential (primary) hypertension: Secondary | ICD-10-CM

## 2018-06-29 LAB — BASIC METABOLIC PANEL
BUN / CREAT RATIO: 30 — AB (ref 12–28)
BUN: 30 mg/dL — ABNORMAL HIGH (ref 8–27)
CO2: 27 mmol/L (ref 20–29)
CREATININE: 0.99 mg/dL (ref 0.57–1.00)
Calcium: 10.4 mg/dL — ABNORMAL HIGH (ref 8.7–10.3)
Chloride: 99 mmol/L (ref 96–106)
GFR calc Af Amer: 59 mL/min/{1.73_m2} — ABNORMAL LOW (ref >59)
GFR calc non Af Amer: 51 mL/min/{1.73_m2} — ABNORMAL LOW (ref >59)
Glucose: 86 mg/dL (ref 65–99)
Potassium: 4.4 mmol/L (ref 3.5–5.2)
SODIUM: 142 mmol/L (ref 134–144)

## 2018-06-29 NOTE — Progress Notes (Signed)
Patient ID: Erika Gibson                 DOB: 01-20-1928                      MRN: [id number]     HPI: Erika Gibson is a 84 y.o. female patient of Dr Jens Som referred by Joni Reining NP to HTN clinic. PMH includes hypertension, osteopenia, CKD, dyslipidemia, and depression. During most recent office visit, I replaced her HCTZ for chlorthalidone 25mg  daily. During OV with PCP on 06/21/18, he increased her telmisartan from 60mg  daily to 80mg  daily. Patient presents today for HTN follow up. Reports improvement on low extremity edema. Denies dizziness, swelling, increased fatigue, headaches or lightheadedness. Noted her BP looking better (140/70 and 118/72) during most recent office visits.  Patient currently under a great deal of stress. Her son moved out of the state and is "making her move" closer to him. She is having difficulties accepting and dealing with all the changes and get teary and uncomfortable taking about the topic.  Current HTN meds:  telmisartan 80mg  daily - 7am Chlorthalidone 25mg  daily  Intolerance: none  BP goal: <130/80  Family History: The patient's family history includes Alzheimer's disease in her father, mother, and other; Breast cancer in her paternal aunt; Colon polyps in her brother; Heart disease in her father.   Social History: The patient  reports that she has quit smoking. She has never used smokeless tobacco. She reports that she does not drink alcohol or use drugs.  Diet: seldomly eats out; mostly home cooked meals  Exercise: decreased frequency walking  Home BP readings: 17 readings; average 154/82; HR 73-100 bpm    Last 4 days average 136/74; HR 76 bpm *patient never rest for few minutes prior to BP checks - believed is better to know how high it get during daily stress*  Wt Readings from Last 3 Encounters:  06/29/18 126 lb 6.4 oz (57.3 kg)  06/26/18 125 lb (56.7 kg)  06/21/18 126 lb (57.2 kg)   BP Readings from Last 3 Encounters:  06/29/18 (!)  172/78  06/26/18 118/72  06/21/18 (!) 144/70   Pulse Readings from Last 3 Encounters:  06/29/18 72  06/26/18 78  06/21/18 90    Past Medical History:  Diagnosis Date  . Anxiety state, unspecified   . ASTHMA   . Colitis, collagenous 2010  . COLONIC POLYPS, HX OF 06/2002   adenomatous polyp  . DEPRESSION   . HYPERLIPIDEMIA   . HYPERTENSION   . Nephrolithiasis   . Osteopenia   . Substance abuse (HCC)     Current Outpatient Medications on File Prior to Visit  Medication Sig Dispense Refill  . aspirin EC 81 MG tablet Take 81 mg by mouth daily.    Marland Kitchen atorvastatin (LIPITOR) 10 MG tablet Take 1 tablet (10 mg total) by mouth daily. 90 tablet 3  . chlorthalidone (HYGROTON) 25 MG tablet Take 1 tablet (25 mg total) by mouth daily. To replace hydrochlorothiazide 25mg  30 tablet 1  . Cholecalciferol (VITAMIN D PO) Take by mouth.    . citalopram (CELEXA) 10 MG tablet Take 1 tablet (10 mg total) by mouth daily. 90 tablet 3  . DOXYLAMINE SUCCINATE PO Take 25 mg by mouth at bedtime.    Marland Kitchen LORazepam (ATIVAN) 0.5 MG tablet 1/2 - 1 tab by mouth at bedtime as needed for sleep 30 tablet 2  . Multiple Vitamin (MULTIVITAMIN) tablet Take 1 tablet  by mouth daily.      Marland Kitchen. telmisartan (MICARDIS) 80 MG tablet Take 1 tablet (80 mg total) by mouth daily. 90 tablet 3  . temazepam (RESTORIL) 7.5 MG capsule Take 1 capsule (7.5 mg total) by mouth at bedtime as needed for sleep. 30 capsule 2  . traMADol (ULTRAM) 50 MG tablet Take 1 tablet (50 mg total) by mouth every 8 (eight) hours as needed. FUTURE REFILLS WITH PCP 30 tablet 0   No current facility-administered medications on file prior to visit.     Allergies  Allergen Reactions  . Mesalamine     REACTION: fuzzy headed and unsteady    Blood pressure (!) 172/78, pulse 72, weight 126 lb 6.4 oz (57.3 kg).  Essential hypertension Blood pressure remains elevated above desired goal of 130/80 but patient is asymptomatic and noted some improvement with home BP  average at 136/74. Also noted her BP during recent PCP visit was at 118/72. Patient is upset this morning due to moving plans and having to list her home for sale. Stress may be playing a role on her elevated BP this morning.   Will continue current therapy without changes, repeat BMET today, and follow up in 3-4 weeks for further medication titration as needed.  Mykaylah Ballman Rodriguez-Guzman PharmD, BCPS, CPP [facility] [address] [place] [phone] 07/02/2018 8:35 AM

## 2018-06-29 NOTE — Patient Instructions (Signed)
Return for a  follow up appointment in 4 weeks  Check your blood pressure at home daily (if able) and keep record of the readings.  Take your BP meds as follows: *NO medication changes*  Bring all of your meds, your BP cuff and your record of home blood pressures to your next appointment.  Exercise as you're able, try to walk approximately 30 minutes per day.  Keep salt intake to a minimum, especially watch canned and prepared boxed foods.  Eat more fresh fruits and vegetables and fewer canned items.  Avoid eating in fast food restaurants.    HOW TO TAKE YOUR BLOOD PRESSURE: . Rest 5 minutes before taking your blood pressure. .  Don't smoke or drink caffeinated beverages for at least 30 minutes before. . Take your blood pressure before (not after) you eat. . Sit comfortably with your back supported and both feet on the floor (don't cross your legs). . Elevate your arm to heart level on a table or a desk. . Use the proper sized cuff. It should fit smoothly and snugly around your bare upper arm. There should be enough room to slip a fingertip under the cuff. The bottom edge of the cuff should be 1 inch above the crease of the elbow. . Ideally, take 3 measurements at one sitting and record the average.    

## 2018-07-02 ENCOUNTER — Encounter: Payer: Self-pay | Admitting: Pharmacist

## 2018-07-02 NOTE — Assessment & Plan Note (Signed)
Blood pressure remains elevated above desired goal of 130/80 but patient is asymptomatic and noted some improvement with home BP average at 136/74. Also noted her BP during recent PCP visit was at 118/72. Patient is upset this morning due to moving plans and having to list her home for sale. Stress may be playing a role on her elevated BP this morning.   Will continue current therapy without changes, repeat BMET today, and follow up in 3-4 weeks for further medication titration as needed.

## 2018-07-04 ENCOUNTER — Other Ambulatory Visit: Payer: Self-pay | Admitting: Cardiology

## 2018-07-04 NOTE — Telephone Encounter (Signed)
Rx request sent to pharmacy.  

## 2018-07-18 ENCOUNTER — Ambulatory Visit (INDEPENDENT_AMBULATORY_CARE_PROVIDER_SITE_OTHER): Payer: Medicare Other | Admitting: Internal Medicine

## 2018-07-18 ENCOUNTER — Encounter: Payer: Self-pay | Admitting: Internal Medicine

## 2018-07-18 ENCOUNTER — Ambulatory Visit: Payer: Self-pay | Admitting: *Deleted

## 2018-07-18 DIAGNOSIS — F329 Major depressive disorder, single episode, unspecified: Secondary | ICD-10-CM | POA: Diagnosis not present

## 2018-07-18 DIAGNOSIS — F32A Depression, unspecified: Secondary | ICD-10-CM

## 2018-07-18 DIAGNOSIS — F411 Generalized anxiety disorder: Secondary | ICD-10-CM

## 2018-07-18 DIAGNOSIS — I10 Essential (primary) hypertension: Secondary | ICD-10-CM
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
## 2018-07-18 NOTE — Telephone Encounter (Signed)
FYI Patient is scheduled to see you today at 4:20pm.

## 2018-07-18 NOTE — Assessment & Plan Note (Signed)
I d/w pt that I declines to rx round the clock benzodiaz tx as this will lead to addiction and high risk of cognitive side effects though she states "whats the difference since I am so old"; she refuses zoloft or counseling referral.  She states "I guess this is a wasted trip."

## 2018-07-18 NOTE — Telephone Encounter (Signed)
Pt calling with complaints of feeling very weak and feels as if her BP may be dropping. Pt states her legs are very tired and weak and it requires her to hold onto things. Pt also fell this morning and states that her legs gave out and she has had trouble keeping her balance. Pt states she has been short of breath in the morning, beginning on yesterday and is SOB with speaking in phrases while talking on the phone to the triage nurse.Pt states her BP at 8 am this morning was 114/66 yesterday 110/72 and the highest reading on Aug 1 was 154/79. Pt asked if she could check her BP while on the phone with triage nurse and pt states she would have to get up to get the monitor. Due to pt sounding very winded and leg weakness pt advised not to get up at this time.  Pt denies any chest pain at this time but states she did experience chest pain the other day in the middle of her chest. Pt states she has been going through a lot of stress recently with having to move and also her younger son being in jail. Pt states she has been taking Citalopram but feels like she may need another medication that works quicker to help with her anxiety. Pt advised that based on her current symptoms she may need to seek treatment in the ED. Pt became very anxious and tearful and states "I can't go through this again", "I don't care about anything anymore" and hung up phone on triage nurse. Triage nurse called pt again and advised that from the current symptoms she was experiencing the ED may be the best place for treatment. Pt became tearful again and is refusing to go to the ED at this time.Pt previously scheduled for appt at 4pm today with Dr. Jonny RuizJohn.  Reason for Disposition . [1] MODERATE difficulty breathing (e.g., speaks in phrases, SOB even at rest, pulse 100-120) AND [2] NEW-onset or WORSE than normal  Answer Assessment - Initial Assessment Questions 1. RESPIRATORY STATUS: "Describe your breathing?" (e.g., wheezing, shortness of  breath, unable to speak, severe coughing)      Speaking in phrases while on the phone with nurse 2. ONSET: "When did this breathing problem begin?"      Yesterday morning 3. PATTERN "Does the difficult breathing come and go, or has it been constant since it started?"      Comes and goes 4. SEVERITY: "How bad is your breathing?" (e.g., mild, moderate, severe)    - MILD: No SOB at rest, mild SOB with walking, speaks normally in sentences, can lay down, no retractions, pulse < 100.    - MODERATE: SOB at rest, SOB with minimal exertion and prefers to sit, cannot lie down flat, speaks in phrases, mild retractions, audible wheezing, pulse 100-120.    - SEVERE: Very SOB at rest, speaks in single words, struggling to breathe, sitting hunched forward, retractions, pulse > 120      moderate 5. RECURRENT SYMPTOM: "Have you had difficulty breathing before?" If so, ask: "When was the last time?" and "What happened that time?"      No 6. CARDIAC HISTORY: "Do you have any history of heart disease?" (e.g., heart attack, angina, bypass surgery, angioplasty)      Not able to assess 7. LUNG HISTORY: "Do you have any history of lung disease?"  (e.g., pulmonary embolus, asthma, emphysema)     Asthma as a child last episode at 23 8. CAUSE: "  What do you think is causing the breathing problem?"      Does not know 9. OTHER SYMPTOMS: "Do you have any other symptoms? (e.g., dizziness, runny nose, cough, chest pain, fever)     Pt states she deals with feeling lightheaded frequently 10. PREGNANCY: "Is there any chance you are pregnant?" "When was your last menstrual period?"       n/a 11. TRAVEL: "Have you traveled out of the country in the last month?" (e.g., travel history, exposures)       Not assessed  Protocols used: BREATHING DIFFICULTY-A-AH

## 2018-07-18 NOTE — Telephone Encounter (Signed)
Called patient and gave her your recommendations. She said that she is feeling a little better now and thinks that most of this could be coming from the sleep medication that she is currently taking along with increased anxiety due to family issues. She said that she will stop taking the sleep medication because she has noticed that it seems to linger in her system and see if this helps. She would like to keep her appointment with you to address these concerns

## 2018-07-18 NOTE — Assessment & Plan Note (Signed)
stable overall by history and exam, recent data reviewed with pt, and pt to continue medical treatment as before,  to f/u any worsening symptoms or concerns  

## 2018-07-18 NOTE — Assessment & Plan Note (Signed)
Denies SI or HI,  to f/u any worsening symptoms or concerns 

## 2018-07-18 NOTE — Telephone Encounter (Signed)
Please ask the patient to go directly to ED instead of here, as all I will do is call the ambulance to take her there, due to the severity of her symptoms and unable to adequately conduct the evaluation in a timely fashion

## 2018-07-18 NOTE — Patient Instructions (Signed)
Please continue all other medications as before, and refills have been done if requested.  Please have the pharmacy call with any other refills you may need.  Please continue your efforts at being more active, low cholesterol diet, and weight control.  Please keep your appointments with your specialists as you may have planned  Good [place] in [place]!

## 2018-07-25 ENCOUNTER — Encounter: Payer: Self-pay | Admitting: Internal Medicine

## 2018-07-29 ENCOUNTER — Other Ambulatory Visit: Payer: Self-pay | Admitting: Cardiology

## 2018-07-30 NOTE — Telephone Encounter (Signed)
Rx sent to pharmacy   

## 2018-07-31 ENCOUNTER — Ambulatory Visit (INDEPENDENT_AMBULATORY_CARE_PROVIDER_SITE_OTHER): Payer: Medicare Other | Admitting: Pharmacist

## 2018-07-31 ENCOUNTER — Encounter: Payer: Self-pay | Admitting: Pharmacist

## 2018-07-31 VITALS — BP 130/66 | HR 78

## 2018-07-31 DIAGNOSIS — I10 Essential (primary) hypertension: Secondary | ICD-10-CM | POA: Diagnosis not present

## 2018-07-31 MED ORDER — CHLORTHALIDONE 25 MG PO TABS: 25.0000 mg | ORAL_TABLET | Freq: Every day | ORAL | 1 refills | Status: DC

## 2018-07-31 NOTE — Patient Instructions (Signed)
Return for a  follow up appointment as needed  Check your blood pressure at home daily (if able) and keep record of the readings.  Take your BP meds as follows: *NO CHANGE IN MEDICATION*  Bring all of your meds, your BP cuff and your record of home blood pressures to your next appointment.  Exercise as you're able, try to walk approximately 30 minutes per day.  Keep salt intake to a minimum, especially watch canned and prepared boxed foods.  Eat more fresh fruits and vegetables and fewer canned items.  Avoid eating in fast food restaurants.    HOW TO TAKE YOUR BLOOD PRESSURE: . Rest 5 minutes before taking your blood pressure. .  Don't smoke or drink caffeinated beverages for at least 30 minutes before. . Take your blood pressure before (not after) you eat. . Sit comfortably with your back supported and both feet on the floor (don't cross your legs). . Elevate your arm to heart level on a table or a desk. . Use the proper sized cuff. It should fit smoothly and snugly around your bare upper arm. There should be enough room to slip a fingertip under the cuff. The bottom edge of the cuff should be 1 inch above the crease of the elbow. . Ideally, take 3 measurements at one sitting and record the average.    

## 2018-07-31 NOTE — Assessment & Plan Note (Signed)
Blood pressure well controlled during office visit and at home. Patient not experiencing ADR to current therapy. Will continue current medication without changes and defer back to PCP for further HTN monitoring.

## 2018-07-31 NOTE — Progress Notes (Signed)
Patient ID: Erika Gibson                 DOB: [birth year]                      MRN: [id number]     HPI: Erika Gibson is a 84 y.o. female patient of Dr Jens Somrenshaw referred by Erika Gibson to HTN clinic. PMH includes hypertension, osteopenia, CKD, dyslipidemia, and depression. Patient reports resolution of low extremity edema. Denies dizziness, swelling, increased fatigue, headaches or lightheadedness. Noted her BP looking better (118/72 and 130/80) during most recent office visits.    Current HTN meds:  telmisartan 80mg  daily - 7am  Chlorthalidone 25mg  daily  Intolerance: none  BP goal: <130/80  Family History: The patient's family history includes Alzheimer's disease in her father, mother, and other; Breast cancer in her paternal aunt; Colon polyps in her brother; Heart disease in her father.   Social History: The patient  reports that she has quit smoking. She has never used smokeless tobacco. She reports that she does not drink alcohol or use drugs.  Diet: seldomly eats out; mostly home cooked meals  Exercise: decreased frequency walking  Home BP readings: 15 readings; average 130/74; HR 55-85 bpm *patient never rest for few minutes prior to BP checks - believed is better to know how high it get during daily stress*  Wt Readings from Last 3 Encounters:  07/18/18 120 lb (54.4 kg)  06/29/18 126 lb 6.4 oz (57.3 kg)  06/26/18 125 lb (56.7 kg)   BP Readings from Last 3 Encounters:  07/31/18 130/66  07/18/18 130/80  06/29/18 (!) 172/78   Pulse Readings from Last 3 Encounters:  07/31/18 78  06/29/18 72  06/26/18 78    Past Medical History:  Diagnosis Date  . Anxiety state, unspecified   . ASTHMA   . Colitis, collagenous 2010  . COLONIC POLYPS, HX OF 06/2002   adenomatous polyp  . DEPRESSION   . HYPERLIPIDEMIA   . HYPERTENSION   . Nephrolithiasis   . Osteopenia   . Substance abuse (HCC)     Current Outpatient Medications on File Prior to Visit  Medication Sig  Dispense Refill  . aspirin EC 81 MG tablet Take 81 mg by mouth daily.    Marland Kitchen. atorvastatin (LIPITOR) 10 MG tablet Take 1 tablet (10 mg total) by mouth daily. 90 tablet 3  . Cholecalciferol (VITAMIN D PO) Take by mouth.    . DOXYLAMINE SUCCINATE PO Take 25 mg by mouth at bedtime.    . Multiple Vitamin (MULTIVITAMIN) tablet Take 1 tablet by mouth daily.      Marland Kitchen. telmisartan (MICARDIS) 80 MG tablet Take 1 tablet (80 mg total) by mouth daily. 90 tablet 3   No current facility-administered medications on file prior to visit.     Allergies  Allergen Reactions  . Mesalamine     REACTION: fuzzy headed and unsteady    Blood pressure 130/66, pulse 78.  Essential hypertension Blood pressure well controlled during office visit and at home. Patient not experiencing ADR to current therapy. Will continue current medication without changes and defer back to PCP for further HTN monitoring.  Aarit Kashuba Rodriguez-Guzman PharmD, BCPS, CPP [facility] [address] [place] [phone] 07/31/2018 6:13 PM

## 2018-08-09 DIAGNOSIS — R350 Frequency of micturition: Secondary | ICD-10-CM | POA: Diagnosis not present

## 2018-08-09 DIAGNOSIS — N3946 Mixed incontinence: Secondary | ICD-10-CM | POA: Diagnosis not present

## 2018-08-17 ENCOUNTER — Ambulatory Visit (INDEPENDENT_AMBULATORY_CARE_PROVIDER_SITE_OTHER): Payer: Medicare Other | Admitting: Podiatry

## 2018-08-17 ENCOUNTER — Encounter: Payer: Self-pay | Admitting: Podiatry

## 2018-08-17 DIAGNOSIS — M79674 Pain in right toe(s): Secondary | ICD-10-CM | POA: Diagnosis not present

## 2018-08-17 DIAGNOSIS — Q828 Other specified congenital malformations of skin: Secondary | ICD-10-CM | POA: Diagnosis not present

## 2018-08-17 DIAGNOSIS — B351 Tinea unguium: Secondary | ICD-10-CM

## 2018-08-17 DIAGNOSIS — M79675 Pain in left toe(s): Secondary | ICD-10-CM

## 2018-08-17 NOTE — Progress Notes (Signed)
Patient ID: Erika Gibson, female   DOB: May 02, 1928, 84 y.o.   MRN: [id number] Patient ID: Erika Gibson, female   DOB: 1928/05/14, 84 y.o.   MRN: [id number] Complaint:  Visit Type: Patient returns to my office for continued preventative foot care services. Complaint: Patient states" my nails have grown long and thick and become painful to walk and wear shoes" . The patient presents for preventative foot care services. No changes to ROS  Podiatric Exam: Vascular: dorsalis pedis and posterior tibial pulses are palpable bilateral. Capillary return is immediate. Temperature gradient is WNL. Skin turgor WNL . Varicosities noted feet B/L Sensorium: Normal Semmes Weinstein monofilament test. Normal tactile sensation bilaterally. Nail Exam: Pt has thick disfigured discolored nails with subungual debris noted bilateral entire nail hallux through fifth toenails Ulcer Exam: There is no evidence of ulcer or pre-ulcerative changes or infection. Orthopedic Exam: Muscle tone and strength are WNL. No limitations in general ROM. No crepitus or effusions noted. Hammer toes 2-5  B/L Skin:  Porokeratosis sub 3 right, asymptomatic No infection or ulcers  Diagnosis:  Onychomycosis, , Pain in right toe, pain in left toes.    Treatment & Plan Procedures and Treatment: Consent by patient was obtained for treatment procedures. The patient understood the discussion of treatment and procedures well. All questions were answered thoroughly reviewed. Debridement of mycotic and hypertrophic toenails, 1 through 5 bilateral and clearing of subungual debris. No ulceration, no infection noted. .  ABN signed for 2019   Return Visit-Office Procedure: Patient instructed to return to the office for a follow up visit 3 months for continued evaluation and treatment.    Helane Gunther DPM

## 2018-09-25 ENCOUNTER — Ambulatory Visit: Payer: Self-pay | Admitting: Internal Medicine

## 2018-09-26 DIAGNOSIS — M545 Low back pain: Secondary | ICD-10-CM | POA: Diagnosis not present

## 2018-09-26 DIAGNOSIS — I10 Essential (primary) hypertension: Secondary | ICD-10-CM | POA: Diagnosis not present

## 2018-09-26 DIAGNOSIS — M6281 Muscle weakness (generalized): Secondary | ICD-10-CM | POA: Diagnosis not present

## 2018-09-26 DIAGNOSIS — Z23 Encounter for immunization: Secondary | ICD-10-CM | POA: Diagnosis not present

## 2018-09-26 DIAGNOSIS — Z9181 History of falling: Secondary | ICD-10-CM | POA: Diagnosis not present

## 2018-10-30 DIAGNOSIS — I10 Essential (primary) hypertension: Secondary | ICD-10-CM | POA: Diagnosis not present

## 2018-10-30 DIAGNOSIS — M545 Low back pain: Secondary | ICD-10-CM | POA: Diagnosis not present

## 2018-10-31 DIAGNOSIS — I083 Combined rheumatic disorders of mitral, aortic and tricuspid valves: Secondary | ICD-10-CM | POA: Diagnosis not present

## 2018-10-31 DIAGNOSIS — I10 Essential (primary) hypertension: Secondary | ICD-10-CM | POA: Diagnosis not present

## 2018-10-31 DIAGNOSIS — I517 Cardiomegaly: Secondary | ICD-10-CM | POA: Diagnosis not present

## 2018-11-06 DIAGNOSIS — M79675 Pain in left toe(s): Secondary | ICD-10-CM | POA: Diagnosis not present

## 2018-11-06 DIAGNOSIS — L600 Ingrowing nail: Secondary | ICD-10-CM | POA: Diagnosis not present

## 2018-11-06 DIAGNOSIS — L089 Local infection of the skin and subcutaneous tissue, unspecified: Secondary | ICD-10-CM | POA: Diagnosis not present

## 2018-11-06 DIAGNOSIS — B351 Tinea unguium: Secondary | ICD-10-CM | POA: Diagnosis not present

## 2018-11-06 DIAGNOSIS — M79674 Pain in right toe(s): Secondary | ICD-10-CM | POA: Diagnosis not present

## 2018-11-14 DIAGNOSIS — H903 Sensorineural hearing loss, bilateral: Secondary | ICD-10-CM | POA: Diagnosis not present

## 2018-11-16 DIAGNOSIS — M545 Low back pain: Secondary | ICD-10-CM | POA: Diagnosis not present

## 2018-11-16 DIAGNOSIS — M6281 Muscle weakness (generalized): Secondary | ICD-10-CM | POA: Diagnosis not present

## 2018-11-19 DIAGNOSIS — M6281 Muscle weakness (generalized): Secondary | ICD-10-CM | POA: Diagnosis not present

## 2018-11-19 DIAGNOSIS — M545 Low back pain: Secondary | ICD-10-CM | POA: Diagnosis not present

## 2018-11-21 DIAGNOSIS — M6281 Muscle weakness (generalized): Secondary | ICD-10-CM | POA: Diagnosis not present

## 2018-11-21 DIAGNOSIS — M545 Low back pain: Secondary | ICD-10-CM | POA: Diagnosis not present

## 2018-11-27 DIAGNOSIS — M545 Low back pain: Secondary | ICD-10-CM | POA: Diagnosis not present

## 2018-11-27 DIAGNOSIS — M6281 Muscle weakness (generalized): Secondary | ICD-10-CM | POA: Diagnosis not present

## 2018-11-29 DIAGNOSIS — E782 Mixed hyperlipidemia: Secondary | ICD-10-CM | POA: Diagnosis not present

## 2018-11-29 DIAGNOSIS — Z79899 Other long term (current) drug therapy: Secondary | ICD-10-CM | POA: Diagnosis not present

## 2018-11-29 DIAGNOSIS — I10 Essential (primary) hypertension: Secondary | ICD-10-CM | POA: Diagnosis not present

## 2018-11-29 DIAGNOSIS — M545 Low back pain: Secondary | ICD-10-CM | POA: Diagnosis not present

## 2018-12-03 DIAGNOSIS — M6281 Muscle weakness (generalized): Secondary | ICD-10-CM | POA: Diagnosis not present

## 2018-12-03 DIAGNOSIS — M545 Low back pain: Secondary | ICD-10-CM | POA: Diagnosis not present

## 2018-12-06 DIAGNOSIS — M6281 Muscle weakness (generalized): Secondary | ICD-10-CM | POA: Diagnosis not present

## 2018-12-06 DIAGNOSIS — M545 Low back pain: Secondary | ICD-10-CM | POA: Diagnosis not present

## 2018-12-10 DIAGNOSIS — M6281 Muscle weakness (generalized): Secondary | ICD-10-CM | POA: Diagnosis not present

## 2018-12-10 DIAGNOSIS — M545 Low back pain: Secondary | ICD-10-CM | POA: Diagnosis not present

## 2018-12-14 DIAGNOSIS — M545 Low back pain: Secondary | ICD-10-CM | POA: Diagnosis not present

## 2018-12-14 DIAGNOSIS — M6281 Muscle weakness (generalized): Secondary | ICD-10-CM | POA: Diagnosis not present

## 2018-12-17 DIAGNOSIS — M545 Low back pain: Secondary | ICD-10-CM | POA: Diagnosis not present

## 2018-12-17 DIAGNOSIS — M6281 Muscle weakness (generalized): Secondary | ICD-10-CM | POA: Diagnosis not present

## 2019-01-17 DIAGNOSIS — Z7982 Long term (current) use of aspirin: Secondary | ICD-10-CM | POA: Diagnosis not present

## 2019-01-17 DIAGNOSIS — S72045A Nondisplaced fracture of base of neck of left femur, initial encounter for closed fracture: Secondary | ICD-10-CM | POA: Diagnosis not present

## 2019-01-17 DIAGNOSIS — Z9882 Breast implant status: Secondary | ICD-10-CM | POA: Diagnosis not present

## 2019-01-17 DIAGNOSIS — M1612 Unilateral primary osteoarthritis, left hip: Secondary | ICD-10-CM | POA: Diagnosis not present

## 2019-01-17 DIAGNOSIS — E785 Hyperlipidemia, unspecified: Secondary | ICD-10-CM | POA: Diagnosis present

## 2019-01-17 DIAGNOSIS — I10 Essential (primary) hypertension: Secondary | ICD-10-CM | POA: Diagnosis not present

## 2019-01-17 DIAGNOSIS — S0990XA Unspecified injury of head, initial encounter: Secondary | ICD-10-CM | POA: Diagnosis not present

## 2019-01-17 DIAGNOSIS — S72012A Unspecified intracapsular fracture of left femur, initial encounter for closed fracture: Secondary | ICD-10-CM | POA: Diagnosis not present

## 2019-01-17 DIAGNOSIS — M79652 Pain in left thigh: Secondary | ICD-10-CM | POA: Diagnosis not present

## 2019-01-17 DIAGNOSIS — S72012D Unspecified intracapsular fracture of left femur, subsequent encounter for closed fracture with routine healing: Secondary | ICD-10-CM | POA: Diagnosis not present

## 2019-01-17 DIAGNOSIS — F321 Major depressive disorder, single episode, moderate: Secondary | ICD-10-CM | POA: Diagnosis not present

## 2019-01-17 DIAGNOSIS — T1490XA Injury, unspecified, initial encounter: Secondary | ICD-10-CM | POA: Diagnosis not present

## 2019-01-17 DIAGNOSIS — N183 Chronic kidney disease, stage 3 (moderate): Secondary | ICD-10-CM | POA: Diagnosis present

## 2019-01-17 DIAGNOSIS — F419 Anxiety disorder, unspecified: Secondary | ICD-10-CM | POA: Diagnosis not present

## 2019-01-17 DIAGNOSIS — S72002A Fracture of unspecified part of neck of left femur, initial encounter for closed fracture: Secondary | ICD-10-CM | POA: Diagnosis not present

## 2019-01-17 DIAGNOSIS — R51 Headache: Secondary | ICD-10-CM | POA: Diagnosis not present

## 2019-01-17 DIAGNOSIS — S3790XA Unspecified injury of unspecified urinary and pelvic organ, initial encounter: Secondary | ICD-10-CM | POA: Diagnosis not present

## 2019-01-17 DIAGNOSIS — K5900 Constipation, unspecified: Secondary | ICD-10-CM | POA: Diagnosis not present

## 2019-01-17 DIAGNOSIS — M542 Cervicalgia: Secondary | ICD-10-CM | POA: Diagnosis not present

## 2019-01-17 DIAGNOSIS — I129 Hypertensive chronic kidney disease with stage 1 through stage 4 chronic kidney disease, or unspecified chronic kidney disease: Secondary | ICD-10-CM | POA: Diagnosis present

## 2019-01-17 DIAGNOSIS — S0190XA Unspecified open wound of unspecified part of head, initial encounter: Secondary | ICD-10-CM | POA: Diagnosis not present

## 2019-01-17 DIAGNOSIS — T40605D Adverse effect of unspecified narcotics, subsequent encounter: Secondary | ICD-10-CM | POA: Diagnosis not present

## 2019-01-17 DIAGNOSIS — K219 Gastro-esophageal reflux disease without esophagitis: Secondary | ICD-10-CM | POA: Diagnosis present

## 2019-01-18 DIAGNOSIS — S72002A Fracture of unspecified part of neck of left femur, initial encounter for closed fracture: Secondary | ICD-10-CM | POA: Diagnosis not present

## 2019-01-21 DIAGNOSIS — E785 Hyperlipidemia, unspecified: Secondary | ICD-10-CM | POA: Diagnosis not present

## 2019-01-21 DIAGNOSIS — M79652 Pain in left thigh: Secondary | ICD-10-CM | POA: Diagnosis not present

## 2019-01-21 DIAGNOSIS — Z7982 Long term (current) use of aspirin: Secondary | ICD-10-CM | POA: Diagnosis not present

## 2019-01-21 DIAGNOSIS — I129 Hypertensive chronic kidney disease with stage 1 through stage 4 chronic kidney disease, or unspecified chronic kidney disease: Secondary | ICD-10-CM | POA: Diagnosis not present

## 2019-01-21 DIAGNOSIS — I10 Essential (primary) hypertension: Secondary | ICD-10-CM | POA: Diagnosis not present

## 2019-01-21 DIAGNOSIS — K5900 Constipation, unspecified: Secondary | ICD-10-CM | POA: Diagnosis not present

## 2019-01-21 DIAGNOSIS — F321 Major depressive disorder, single episode, moderate: Secondary | ICD-10-CM | POA: Diagnosis not present

## 2019-01-21 DIAGNOSIS — K219 Gastro-esophageal reflux disease without esophagitis: Secondary | ICD-10-CM | POA: Diagnosis not present

## 2019-01-21 DIAGNOSIS — N183 Chronic kidney disease, stage 3 (moderate): Secondary | ICD-10-CM | POA: Diagnosis not present

## 2019-01-21 DIAGNOSIS — F419 Anxiety disorder, unspecified: Secondary | ICD-10-CM | POA: Diagnosis not present

## 2019-01-21 DIAGNOSIS — S72012D Unspecified intracapsular fracture of left femur, subsequent encounter for closed fracture with routine healing: Secondary | ICD-10-CM | POA: Diagnosis not present

## 2019-01-21 DIAGNOSIS — T40605D Adverse effect of unspecified narcotics, subsequent encounter: Secondary | ICD-10-CM | POA: Diagnosis not present

## 2019-01-25 ENCOUNTER — Inpatient Hospital Stay (HOSPITAL_COMMUNITY): Admission: RE | Admit: 2019-01-25 | Payer: Self-pay | Source: Ambulatory Visit

## 2019-01-28 ENCOUNTER — Other Ambulatory Visit: Payer: Self-pay | Admitting: Cardiology

## 2019-01-31 DIAGNOSIS — R220 Localized swelling, mass and lump, head: Secondary | ICD-10-CM | POA: Diagnosis not present

## 2019-01-31 DIAGNOSIS — Z7982 Long term (current) use of aspirin: Secondary | ICD-10-CM | POA: Diagnosis not present

## 2019-01-31 DIAGNOSIS — R51 Headache: Secondary | ICD-10-CM | POA: Diagnosis not present

## 2019-01-31 DIAGNOSIS — F339 Major depressive disorder, recurrent, unspecified: Secondary | ICD-10-CM | POA: Diagnosis not present

## 2019-01-31 DIAGNOSIS — S0093XA Contusion of unspecified part of head, initial encounter: Secondary | ICD-10-CM | POA: Diagnosis not present

## 2019-01-31 DIAGNOSIS — S0083XA Contusion of other part of head, initial encounter: Secondary | ICD-10-CM | POA: Diagnosis not present

## 2019-01-31 DIAGNOSIS — E7800 Pure hypercholesterolemia, unspecified: Secondary | ICD-10-CM | POA: Diagnosis not present

## 2019-01-31 DIAGNOSIS — Z7901 Long term (current) use of anticoagulants: Secondary | ICD-10-CM | POA: Diagnosis not present

## 2019-01-31 DIAGNOSIS — R404 Transient alteration of awareness: Secondary | ICD-10-CM | POA: Diagnosis not present

## 2019-01-31 DIAGNOSIS — K219 Gastro-esophageal reflux disease without esophagitis: Secondary | ICD-10-CM | POA: Diagnosis not present

## 2019-01-31 DIAGNOSIS — S0291XA Unspecified fracture of skull, initial encounter for closed fracture: Secondary | ICD-10-CM | POA: Diagnosis not present

## 2019-01-31 DIAGNOSIS — S3289XD Fracture of other parts of pelvis, subsequent encounter for fracture with routine healing: Secondary | ICD-10-CM | POA: Diagnosis not present

## 2019-01-31 DIAGNOSIS — M179 Osteoarthritis of knee, unspecified: Secondary | ICD-10-CM | POA: Diagnosis not present

## 2019-01-31 DIAGNOSIS — S0990XA Unspecified injury of head, initial encounter: Secondary | ICD-10-CM | POA: Diagnosis not present

## 2019-01-31 DIAGNOSIS — N183 Chronic kidney disease, stage 3 (moderate): Secondary | ICD-10-CM | POA: Diagnosis not present

## 2019-01-31 DIAGNOSIS — N136 Pyonephrosis: Secondary | ICD-10-CM | POA: Diagnosis not present

## 2019-01-31 DIAGNOSIS — Z4789 Encounter for other orthopedic aftercare: Secondary | ICD-10-CM | POA: Diagnosis not present

## 2019-01-31 DIAGNOSIS — M1990 Unspecified osteoarthritis, unspecified site: Secondary | ICD-10-CM | POA: Diagnosis not present

## 2019-01-31 DIAGNOSIS — Z87891 Personal history of nicotine dependence: Secondary | ICD-10-CM | POA: Diagnosis not present

## 2019-01-31 DIAGNOSIS — E785 Hyperlipidemia, unspecified: Secondary | ICD-10-CM | POA: Diagnosis not present

## 2019-01-31 DIAGNOSIS — M542 Cervicalgia: Secondary | ICD-10-CM | POA: Diagnosis not present

## 2019-01-31 DIAGNOSIS — Z96642 Presence of left artificial hip joint: Secondary | ICD-10-CM | POA: Diagnosis not present

## 2019-01-31 DIAGNOSIS — M84459A Pathological fracture, hip, unspecified, initial encounter for fracture: Secondary | ICD-10-CM | POA: Diagnosis not present

## 2019-01-31 DIAGNOSIS — S72045D Nondisplaced fracture of base of neck of left femur, subsequent encounter for closed fracture with routine healing: Secondary | ICD-10-CM | POA: Diagnosis not present

## 2019-01-31 DIAGNOSIS — I10 Essential (primary) hypertension: Secondary | ICD-10-CM | POA: Diagnosis not present

## 2019-02-06 DIAGNOSIS — I10 Essential (primary) hypertension: Secondary | ICD-10-CM | POA: Diagnosis not present

## 2019-02-06 DIAGNOSIS — M84459A Pathological fracture, hip, unspecified, initial encounter for fracture: Secondary | ICD-10-CM | POA: Diagnosis not present

## 2019-02-06 DIAGNOSIS — E7800 Pure hypercholesterolemia, unspecified: Secondary | ICD-10-CM | POA: Diagnosis not present

## 2019-02-06 DIAGNOSIS — N136 Pyonephrosis: Secondary | ICD-10-CM | POA: Diagnosis not present

## 2019-02-14 DIAGNOSIS — M179 Osteoarthritis of knee, unspecified: Secondary | ICD-10-CM | POA: Diagnosis not present

## 2019-02-15 DIAGNOSIS — R220 Localized swelling, mass and lump, head: Secondary | ICD-10-CM | POA: Diagnosis not present

## 2019-02-15 DIAGNOSIS — I10 Essential (primary) hypertension: Secondary | ICD-10-CM | POA: Diagnosis not present

## 2019-02-15 DIAGNOSIS — Z7982 Long term (current) use of aspirin: Secondary | ICD-10-CM | POA: Diagnosis not present

## 2019-02-15 DIAGNOSIS — R51 Headache: Secondary | ICD-10-CM | POA: Diagnosis not present

## 2019-02-15 DIAGNOSIS — S0093XA Contusion of unspecified part of head, initial encounter: Secondary | ICD-10-CM | POA: Diagnosis not present

## 2019-02-15 DIAGNOSIS — Z7901 Long term (current) use of anticoagulants: Secondary | ICD-10-CM | POA: Diagnosis not present

## 2019-02-15 DIAGNOSIS — S0990XA Unspecified injury of head, initial encounter: Secondary | ICD-10-CM | POA: Diagnosis not present

## 2019-02-15 DIAGNOSIS — Z87891 Personal history of nicotine dependence: Secondary | ICD-10-CM | POA: Diagnosis not present

## 2019-02-15 DIAGNOSIS — M542 Cervicalgia: Secondary | ICD-10-CM | POA: Diagnosis not present

## 2019-02-15 DIAGNOSIS — S0083XA Contusion of other part of head, initial encounter: Secondary | ICD-10-CM | POA: Diagnosis not present

## 2019-03-15 DIAGNOSIS — Z96642 Presence of left artificial hip joint: Secondary | ICD-10-CM | POA: Diagnosis not present

## 2019-04-25 DIAGNOSIS — S72002D Fracture of unspecified part of neck of left femur, subsequent encounter for closed fracture with routine healing: Secondary | ICD-10-CM | POA: Diagnosis not present

## 2019-04-25 DIAGNOSIS — M179 Osteoarthritis of knee, unspecified: Secondary | ICD-10-CM | POA: Diagnosis not present

## 2019-04-26 DIAGNOSIS — R350 Frequency of micturition: Secondary | ICD-10-CM | POA: Diagnosis not present

## 2019-04-26 DIAGNOSIS — R4182 Altered mental status, unspecified: Secondary | ICD-10-CM | POA: Diagnosis not present

## 2019-05-09 DIAGNOSIS — S72002D Fracture of unspecified part of neck of left femur, subsequent encounter for closed fracture with routine healing: Secondary | ICD-10-CM | POA: Diagnosis not present

## 2019-05-09 DIAGNOSIS — M179 Osteoarthritis of knee, unspecified: Secondary | ICD-10-CM | POA: Diagnosis not present

## 2019-05-15 DIAGNOSIS — K219 Gastro-esophageal reflux disease without esophagitis: Secondary | ICD-10-CM | POA: Diagnosis not present

## 2019-05-15 DIAGNOSIS — R350 Frequency of micturition: Secondary | ICD-10-CM | POA: Diagnosis not present

## 2019-05-15 DIAGNOSIS — G4700 Insomnia, unspecified: Secondary | ICD-10-CM | POA: Diagnosis not present

## 2019-05-15 DIAGNOSIS — R413 Other amnesia: Secondary | ICD-10-CM | POA: Diagnosis not present

## 2019-05-20 DIAGNOSIS — M179 Osteoarthritis of knee, unspecified: Secondary | ICD-10-CM | POA: Diagnosis not present

## 2019-05-20 DIAGNOSIS — S72002D Fracture of unspecified part of neck of left femur, subsequent encounter for closed fracture with routine healing: Secondary | ICD-10-CM | POA: Diagnosis not present

## 2019-07-23 DIAGNOSIS — R413 Other amnesia: Secondary | ICD-10-CM | POA: Diagnosis not present

## 2019-07-23 DIAGNOSIS — M545 Low back pain: Secondary | ICD-10-CM | POA: Diagnosis not present

## 2019-07-23 DIAGNOSIS — K219 Gastro-esophageal reflux disease without esophagitis: Secondary | ICD-10-CM | POA: Diagnosis not present

## 2019-07-23 DIAGNOSIS — R296 Repeated falls: Secondary | ICD-10-CM | POA: Diagnosis not present

## 2019-07-26 DIAGNOSIS — R197 Diarrhea, unspecified: Secondary | ICD-10-CM | POA: Diagnosis not present

## 2019-07-26 DIAGNOSIS — M2550 Pain in unspecified joint: Secondary | ICD-10-CM | POA: Diagnosis not present

## 2019-07-26 DIAGNOSIS — R413 Other amnesia: Secondary | ICD-10-CM | POA: Diagnosis not present

## 2019-07-26 DIAGNOSIS — Z79899 Other long term (current) drug therapy: Secondary | ICD-10-CM | POA: Diagnosis not present

## 2019-07-29 DIAGNOSIS — M2550 Pain in unspecified joint: Secondary | ICD-10-CM | POA: Diagnosis not present

## 2019-07-29 DIAGNOSIS — R413 Other amnesia: Secondary | ICD-10-CM | POA: Diagnosis not present

## 2019-07-29 DIAGNOSIS — R197 Diarrhea, unspecified: Secondary | ICD-10-CM | POA: Diagnosis not present

## 2019-08-12 DIAGNOSIS — M179 Osteoarthritis of knee, unspecified: Secondary | ICD-10-CM | POA: Diagnosis not present

## 2019-08-12 DIAGNOSIS — S72002D Fracture of unspecified part of neck of left femur, subsequent encounter for closed fracture with routine healing: Secondary | ICD-10-CM | POA: Diagnosis not present

## 2019-10-01 DIAGNOSIS — Z23 Encounter for immunization: Secondary | ICD-10-CM | POA: Diagnosis not present

## 2019-10-08 DIAGNOSIS — M549 Dorsalgia, unspecified: Secondary | ICD-10-CM | POA: Diagnosis not present

## 2019-10-08 DIAGNOSIS — Z6821 Body mass index (BMI) 21.0-21.9, adult: Secondary | ICD-10-CM | POA: Diagnosis not present

## 2019-10-08 DIAGNOSIS — R194 Change in bowel habit: Secondary | ICD-10-CM | POA: Diagnosis not present

## 2019-10-08 DIAGNOSIS — R197 Diarrhea, unspecified: Secondary | ICD-10-CM | POA: Diagnosis not present

## 2019-10-31 DIAGNOSIS — N200 Calculus of kidney: Secondary | ICD-10-CM | POA: Diagnosis not present

## 2019-10-31 DIAGNOSIS — M47816 Spondylosis without myelopathy or radiculopathy, lumbar region: Secondary | ICD-10-CM | POA: Diagnosis not present

## 2019-10-31 DIAGNOSIS — M419 Scoliosis, unspecified: Secondary | ICD-10-CM | POA: Diagnosis not present

## 2019-10-31 DIAGNOSIS — R1011 Right upper quadrant pain: Secondary | ICD-10-CM | POA: Diagnosis not present

## 2019-11-19 DIAGNOSIS — F329 Major depressive disorder, single episode, unspecified: Secondary | ICD-10-CM | POA: Diagnosis not present

## 2019-11-19 DIAGNOSIS — R4189 Other symptoms and signs involving cognitive functions and awareness: Secondary | ICD-10-CM | POA: Diagnosis not present

## 2019-11-19 DIAGNOSIS — H9193 Unspecified hearing loss, bilateral: Secondary | ICD-10-CM | POA: Diagnosis not present

## 2019-11-27 DIAGNOSIS — U071 COVID-19: Secondary | ICD-10-CM | POA: Diagnosis not present

## 2019-12-01 DIAGNOSIS — E876 Hypokalemia: Secondary | ICD-10-CM | POA: Diagnosis not present

## 2019-12-08 DIAGNOSIS — E876 Hypokalemia: Secondary | ICD-10-CM | POA: Diagnosis not present

## 2019-12-27 DIAGNOSIS — M940 Chondrocostal junction syndrome [Tietze]: Secondary | ICD-10-CM | POA: Diagnosis not present

## 2019-12-27 DIAGNOSIS — I517 Cardiomegaly: Secondary | ICD-10-CM | POA: Diagnosis not present

## 2019-12-27 DIAGNOSIS — K529 Noninfective gastroenteritis and colitis, unspecified: Secondary | ICD-10-CM | POA: Diagnosis present

## 2019-12-27 DIAGNOSIS — F418 Other specified anxiety disorders: Secondary | ICD-10-CM | POA: Diagnosis not present

## 2019-12-27 DIAGNOSIS — K219 Gastro-esophageal reflux disease without esophagitis: Secondary | ICD-10-CM | POA: Diagnosis present

## 2019-12-27 DIAGNOSIS — R944 Abnormal results of kidney function studies: Secondary | ICD-10-CM | POA: Diagnosis not present

## 2019-12-27 DIAGNOSIS — I130 Hypertensive heart and chronic kidney disease with heart failure and stage 1 through stage 4 chronic kidney disease, or unspecified chronic kidney disease: Secondary | ICD-10-CM | POA: Diagnosis not present

## 2019-12-27 DIAGNOSIS — R0602 Shortness of breath: Secondary | ICD-10-CM | POA: Diagnosis not present

## 2019-12-27 DIAGNOSIS — I509 Heart failure, unspecified: Secondary | ICD-10-CM | POA: Diagnosis not present

## 2019-12-27 DIAGNOSIS — Z87442 Personal history of urinary calculi: Secondary | ICD-10-CM | POA: Diagnosis not present

## 2019-12-27 DIAGNOSIS — I358 Other nonrheumatic aortic valve disorders: Secondary | ICD-10-CM | POA: Diagnosis present

## 2019-12-27 DIAGNOSIS — Z79899 Other long term (current) drug therapy: Secondary | ICD-10-CM | POA: Diagnosis not present

## 2019-12-27 DIAGNOSIS — Z20822 Contact with and (suspected) exposure to covid-19: Secondary | ICD-10-CM | POA: Diagnosis not present

## 2019-12-27 DIAGNOSIS — E7800 Pure hypercholesterolemia, unspecified: Secondary | ICD-10-CM | POA: Diagnosis present

## 2019-12-27 DIAGNOSIS — F339 Major depressive disorder, recurrent, unspecified: Secondary | ICD-10-CM | POA: Diagnosis not present

## 2019-12-27 DIAGNOSIS — I4891 Unspecified atrial fibrillation: Secondary | ICD-10-CM | POA: Diagnosis present

## 2019-12-27 DIAGNOSIS — Z7901 Long term (current) use of anticoagulants: Secondary | ICD-10-CM | POA: Diagnosis not present

## 2019-12-27 DIAGNOSIS — G4700 Insomnia, unspecified: Secondary | ICD-10-CM | POA: Diagnosis present

## 2019-12-27 DIAGNOSIS — Z681 Body mass index (BMI) 19 or less, adult: Secondary | ICD-10-CM | POA: Diagnosis not present

## 2019-12-27 DIAGNOSIS — N1832 Chronic kidney disease, stage 3b: Secondary | ICD-10-CM | POA: Diagnosis not present

## 2019-12-27 DIAGNOSIS — N289 Disorder of kidney and ureter, unspecified: Secondary | ICD-10-CM | POA: Diagnosis present

## 2019-12-27 DIAGNOSIS — E785 Hyperlipidemia, unspecified: Secondary | ICD-10-CM | POA: Diagnosis present

## 2019-12-27 DIAGNOSIS — E876 Hypokalemia: Secondary | ICD-10-CM | POA: Diagnosis not present

## 2019-12-27 DIAGNOSIS — F0390 Unspecified dementia without behavioral disturbance: Secondary | ICD-10-CM | POA: Diagnosis present

## 2019-12-27 DIAGNOSIS — F419 Anxiety disorder, unspecified: Secondary | ICD-10-CM | POA: Diagnosis present

## 2020-01-01 DIAGNOSIS — E876 Hypokalemia: Secondary | ICD-10-CM | POA: Diagnosis not present

## 2020-01-03 DIAGNOSIS — Z23 Encounter for immunization: Secondary | ICD-10-CM | POA: Diagnosis not present

## 2020-01-15 DIAGNOSIS — Z79899 Other long term (current) drug therapy: Secondary | ICD-10-CM | POA: Diagnosis not present

## 2020-01-15 DIAGNOSIS — R4189 Other symptoms and signs involving cognitive functions and awareness: Secondary | ICD-10-CM | POA: Diagnosis not present

## 2020-01-27 DIAGNOSIS — G3184 Mild cognitive impairment, so stated: Secondary | ICD-10-CM | POA: Diagnosis not present

## 2020-01-27 DIAGNOSIS — R109 Unspecified abdominal pain: Secondary | ICD-10-CM | POA: Diagnosis not present

## 2020-01-27 DIAGNOSIS — R4189 Other symptoms and signs involving cognitive functions and awareness: Secondary | ICD-10-CM | POA: Diagnosis not present

## 2020-01-27 DIAGNOSIS — I6782 Cerebral ischemia: Secondary | ICD-10-CM | POA: Diagnosis not present

## 2020-01-27 DIAGNOSIS — R1010 Upper abdominal pain, unspecified: Secondary | ICD-10-CM | POA: Diagnosis not present

## 2020-01-27 DIAGNOSIS — Z79899 Other long term (current) drug therapy: Secondary | ICD-10-CM | POA: Diagnosis not present

## 2020-01-27 DIAGNOSIS — G9389 Other specified disorders of brain: Secondary | ICD-10-CM | POA: Diagnosis not present

## 2020-01-31 DIAGNOSIS — Z23 Encounter for immunization: Secondary | ICD-10-CM | POA: Diagnosis not present

## 2020-02-10 DIAGNOSIS — L089 Local infection of the skin and subcutaneous tissue, unspecified: Secondary | ICD-10-CM | POA: Diagnosis not present

## 2020-02-10 DIAGNOSIS — M79674 Pain in right toe(s): Secondary | ICD-10-CM | POA: Diagnosis not present

## 2020-02-10 DIAGNOSIS — B351 Tinea unguium: Secondary | ICD-10-CM | POA: Diagnosis not present

## 2020-02-10 DIAGNOSIS — L600 Ingrowing nail: Secondary | ICD-10-CM | POA: Diagnosis not present

## 2020-02-10 DIAGNOSIS — M79675 Pain in left toe(s): Secondary | ICD-10-CM | POA: Diagnosis not present

## 2020-02-20 DIAGNOSIS — I679 Cerebrovascular disease, unspecified: Secondary | ICD-10-CM | POA: Diagnosis not present

## 2020-02-20 DIAGNOSIS — R4189 Other symptoms and signs involving cognitive functions and awareness: Secondary | ICD-10-CM | POA: Diagnosis not present

## 2020-02-20 DIAGNOSIS — G4700 Insomnia, unspecified: Secondary | ICD-10-CM | POA: Diagnosis not present

## 2020-02-20 DIAGNOSIS — I10 Essential (primary) hypertension: Secondary | ICD-10-CM | POA: Diagnosis not present

## 2020-02-25 DIAGNOSIS — F0390 Unspecified dementia without behavioral disturbance: Secondary | ICD-10-CM | POA: Diagnosis not present

## 2020-03-10 DIAGNOSIS — I679 Cerebrovascular disease, unspecified: Secondary | ICD-10-CM | POA: Diagnosis not present

## 2020-03-10 DIAGNOSIS — R4189 Other symptoms and signs involving cognitive functions and awareness: Secondary | ICD-10-CM | POA: Diagnosis not present

## 2020-03-10 DIAGNOSIS — I6789 Other cerebrovascular disease: Secondary | ICD-10-CM | POA: Diagnosis not present

## 2020-03-10 DIAGNOSIS — Z79899 Other long term (current) drug therapy: Secondary | ICD-10-CM | POA: Diagnosis not present
